# Patient Record
Sex: Male | Born: 1965 | Race: White | Hispanic: No | Marital: Married | Smoking: Former smoker
Health system: Southern US, Community
[De-identification: ages and names within clinical notes are randomized; demographics above are authoritative.]

## PROBLEM LIST (undated history)

## (undated) DIAGNOSIS — N200 Calculus of kidney: Secondary | ICD-10-CM

## (undated) DIAGNOSIS — I10 Essential (primary) hypertension: Secondary | ICD-10-CM

## (undated) DIAGNOSIS — T7840XA Allergy, unspecified, initial encounter: Secondary | ICD-10-CM

## (undated) HISTORY — DX: Essential (primary) hypertension: I10

## (undated) HISTORY — DX: Allergy, unspecified, initial encounter: T78.40XA

## (undated) HISTORY — DX: Calculus of kidney: N20.0

## (undated) HISTORY — PX: WISDOM TOOTH EXTRACTION: SHX21

## (undated) HISTORY — PX: OTHER SURGICAL HISTORY: SHX169

---

## 1999-11-18 ENCOUNTER — Emergency Department (HOSPITAL_COMMUNITY): Admission: EM | Admit: 1999-11-18 | Discharge: 1999-11-18 | Payer: Self-pay | Admitting: Emergency Medicine

## 2006-01-10 ENCOUNTER — Emergency Department (HOSPITAL_COMMUNITY): Admission: EM | Admit: 2006-01-10 | Discharge: 2006-01-10 | Payer: Self-pay | Admitting: Emergency Medicine

## 2006-01-15 ENCOUNTER — Ambulatory Visit: Payer: Self-pay | Admitting: Internal Medicine

## 2006-01-19 ENCOUNTER — Ambulatory Visit: Payer: Self-pay | Admitting: Internal Medicine

## 2012-11-02 ENCOUNTER — Ambulatory Visit (INDEPENDENT_AMBULATORY_CARE_PROVIDER_SITE_OTHER): Payer: 59 | Admitting: Physician Assistant

## 2012-11-02 VITALS — BP 130/80 | HR 83 | Temp 98.4°F | Resp 18 | Ht 71.0 in | Wt 214.0 lb

## 2012-11-02 DIAGNOSIS — J029 Acute pharyngitis, unspecified: Secondary | ICD-10-CM

## 2012-11-02 DIAGNOSIS — J309 Allergic rhinitis, unspecified: Secondary | ICD-10-CM

## 2012-11-02 DIAGNOSIS — R059 Cough, unspecified: Secondary | ICD-10-CM

## 2012-11-02 DIAGNOSIS — R509 Fever, unspecified: Secondary | ICD-10-CM

## 2012-11-02 DIAGNOSIS — R05 Cough: Secondary | ICD-10-CM

## 2012-11-02 DIAGNOSIS — M791 Myalgia, unspecified site: Secondary | ICD-10-CM

## 2012-11-02 DIAGNOSIS — IMO0001 Reserved for inherently not codable concepts without codable children: Secondary | ICD-10-CM

## 2012-11-02 LAB — POCT CBC
HCT, POC: 51.1 % (ref 43.5–53.7)
Lymph, poc: 1.3 (ref 0.6–3.4)
MCH, POC: 30.3 pg (ref 27–31.2)
MCV: 94.2 fL (ref 80–97)
MPV: 10.2 fL (ref 0–99.8)
POC Granulocyte: 2 (ref 2–6.9)
POC LYMPH PERCENT: 36.6 %L (ref 10–50)
POC MID %: 6.8 %M (ref 0–12)
RBC: 5.42 M/uL (ref 4.69–6.13)
WBC: 3.6 10*3/uL — AB (ref 4.6–10.2)

## 2012-11-02 MED ORDER — FLUTICASONE PROPIONATE 50 MCG/ACT NA SUSP: 2.0000 | Freq: Every day | NASAL | Status: DC

## 2012-11-02 MED ORDER — GUAIFENESIN ER 1200 MG PO TB12: 1.0000 | ORAL_TABLET | Freq: Two times a day (BID) | ORAL | Status: DC | PRN

## 2012-11-02 MED ORDER — BENZONATATE 100 MG PO CAPS: 100.0000 mg | ORAL_CAPSULE | Freq: Three times a day (TID) | ORAL | Status: DC | PRN

## 2012-11-02 MED ORDER — IPRATROPIUM BROMIDE 0.03 % NA SOLN: 2.0000 | Freq: Two times a day (BID) | NASAL | Status: DC

## 2012-11-02 NOTE — Progress Notes (Signed)
Subjective:    Patient ID: Brian Gay, male    DOB: 02/15/1966, 47 y.o.   MRN: [id number]  HPI  This 47 y.o. male presents for evaluation of several days of illness. Symptoms began 10/30/2012. "I feel like I've been beat with a baseball bat."  Tmax 102 yesterday.  Very congested.  Sore throat. Bad cough prevents sleeping. Little bit of diarrhea.  No N/V. "I have real bad allergy problems that usually starts about this time of year."  Has been feeling some ear symptoms, fullness and pressure, even before this illness. No flu vaccine this season.    History reviewed. No pertinent past medical history.  History reviewed. No pertinent past surgical history.  Prior to Admission medications   Medication Sig Start Date End Date Taking? Authorizing Provider  acetaminophen (TYLENOL) 500 MG tablet Take 500 mg by mouth every 6 (six) hours as needed for pain.   Yes Historical Provider, MD    Allergies  Allergen Reactions  . Codeine Swelling    History   Social History  . Marital Status: Married    Spouse Name: Tammy    Number of Children: 1  . Years of Education: 16   Occupational History  . Garage Supervisor [employer]   Social History Main Topics  . Smoking status: Never Smoker   . Alcohol Use: No  . Drug Use: No  . Sexually Active: Yes -- Male partner(s)    Social History Narrative   Married since 1984.    History reviewed. No pertinent family history.   Review of Systems As above.    Objective:   Physical Exam Blood pressure 130/80, pulse 83, temperature 98.4 F (36.9 C), temperature source Oral, resp. rate 18, height 5\' 11"  (1.803 m), weight 214 lb (97.07 kg), SpO2 98.00%. Body mass index is 29.86 kg/(m^2). Well-developed, well nourished WM who is awake, alert and oriented, in NAD. HEENT: [place]/AT, PERRL, EOMI.  Sclera and conjunctiva are clear.  EAC are patent, TMs are normal in appearance. Nasal mucosa is pink and moist, moderately congested. OP is clear.  No frontal or maxillary sinus tenderness. Neck: supple, no lymphadenopathy, but bilateral fullness and tenderness, no thyromegaly. Heart: RRR, no murmur Lungs: normal effort, CTA Extremities: no cyanosis, clubbing or edema. Skin: warm and dry without rash. Psychologic: good mood and appropriate affect, normal speech and behavior.  Results for orders placed in visit on 11/02/12  POCT INFLUENZA A/B      Result Value Range   Influenza A, POC Negative     Influenza B, POC Negative    POCT RAPID STREP A (OFFICE)      Result Value Range   Rapid Strep A Screen Negative  Negative  POCT CBC      Result Value Range   WBC 3.6 (*) 4.6 - 10.2 K/uL   Lymph, poc 1.3  0.6 - 3.4   POC LYMPH PERCENT 36.6  10 - 50 %L   MID (cbc) 0.2  0 - 0.9   POC MID % 6.8  0 - 12 %M   POC Granulocyte 2.0  2 - 6.9   Granulocyte percent 56.6  37 - 80 %G   RBC 5.42  4.69 - 6.13 M/uL   Hemoglobin 16.4  14.1 - 18.1 g/dL   HCT, POC 40.9  81.1 - 53.7 %   MCV 94.2  80 - 97 fL   MCH, POC 30.3  27 - 31.2 pg   MCHC 32.1  31.8 - 35.4 g/dL  RDW, POC 13.5     Platelet Count, POC 151  142 - 424 K/uL   MPV 10.2  0 - 99.8 fL      Assessment & Plan:  Fever - Plan: POCT Influenza A/B, POCT CBC, Culture, Group A Strep  Muscle pain - Plan: POCT Influenza A/B  Allergic rhinitis - Plan: fluticasone (FLONASE) 50 MCG/ACT nasal spray, ipratropium (ATROVENT) 0.03 % nasal spray  Acute pharyngitis - Plan: POCT rapid strep A, POCT CBC, Culture, Group A Strep  Cough - Plan: Guaifenesin (MUCINEX MAXIMUM STRENGTH) 1200 MG TB12, benzonatate (TESSALON) 100 MG capsule  Anticipatory guidance, supportive care, RTC if symptoms worsen/persist.

## 2012-11-02 NOTE — Patient Instructions (Signed)
It appears that you have a viral illness, possibly the flu (even though the flu test was negative). You need to rest, drink at least 64 ounces of water each day. Take ibuprofen &/or acetaminophen for aches and fever.

## 2012-11-05 LAB — CULTURE, GROUP A STREP

## 2012-11-10 ENCOUNTER — Telehealth: Payer: Self-pay

## 2012-11-10 NOTE — Telephone Encounter (Signed)
Pt called back about labs. Notified of normal. TC. Pt still feeling fatigued. Advised giving it 7-10 days and if he still felt bad, RTC

## 2013-03-08 ENCOUNTER — Ambulatory Visit (INDEPENDENT_AMBULATORY_CARE_PROVIDER_SITE_OTHER): Payer: 59 | Admitting: Emergency Medicine

## 2013-03-08 VITALS — BP 120/82 | HR 80 | Temp 98.0°F | Resp 18 | Ht 71.5 in | Wt 215.0 lb

## 2013-03-08 DIAGNOSIS — H6990 Unspecified Eustachian tube disorder, unspecified ear: Secondary | ICD-10-CM

## 2013-03-08 DIAGNOSIS — J018 Other acute sinusitis: Secondary | ICD-10-CM

## 2013-03-08 DIAGNOSIS — J309 Allergic rhinitis, unspecified: Secondary | ICD-10-CM

## 2013-03-08 DIAGNOSIS — H6992 Unspecified Eustachian tube disorder, left ear: Secondary | ICD-10-CM

## 2013-03-08 MED ORDER — AMOXICILLIN-POT CLAVULANATE 875-125 MG PO TABS: 1.0000 | ORAL_TABLET | Freq: Two times a day (BID) | ORAL | Status: DC

## 2013-03-08 MED ORDER — PSEUDOEPHEDRINE-GUAIFENESIN ER 60-600 MG PO TB12: 1.0000 | ORAL_TABLET | Freq: Two times a day (BID) | ORAL | Status: AC

## 2013-03-08 MED ORDER — FLUTICASONE PROPIONATE 50 MCG/ACT NA SUSP: 2.0000 | Freq: Every day | NASAL | Status: DC

## 2013-03-08 NOTE — Patient Instructions (Addendum)

## 2013-03-08 NOTE — Progress Notes (Signed)
[facility] [address] [place] [address] [phone]  Date:  03/08/2013   Name:  Brian Gay   DOB:  [birth year]   MRN:  [id number]  PCP:  No PCP Per Patient    Chief Complaint: sinus issues   History of Present Illness:  Brian Gay is a 47 y.o. very pleasant male patient who presents with the following:  Says that he has "sinus problems"  Has pain in left ear.  Has post nasal drip and no nasal drainage.  No fever or chills.  No nausea or vomiting.  Nonproductive cough.  Says fatigued and poor energy level.  No nausea or vomiting.  No improvement with claritin.  No improvement with over the counter medications or other home remedies. Denies other complaint or health concern today.   Patient Active Problem List   Diagnosis Date Noted  . Allergic rhinitis 11/02/2012    History reviewed. No pertinent past medical history.  History reviewed. No pertinent past surgical history.  History  Substance Use Topics  . Smoking status: Never Smoker   . Smokeless tobacco: Not on file  . Alcohol Use: No    History reviewed. No pertinent family history.  Allergies  Allergen Reactions  . Codeine Swelling    Medication list has been reviewed and updated.  Current Outpatient Prescriptions on File Prior to Visit  Medication Sig Dispense Refill  . acetaminophen (TYLENOL) 500 MG tablet Take 500 mg by mouth every 6 (six) hours as needed for pain.      . benzonatate (TESSALON) 100 MG capsule Take 1-2 capsules (100-200 mg total) by mouth 3 (three) times daily as needed for cough.  40 capsule  0  . fluticasone (FLONASE) 50 MCG/ACT nasal spray Place 2 sprays into the nose daily.  16 g  12  . Guaifenesin (MUCINEX MAXIMUM STRENGTH) 1200 MG TB12 Take 1 tablet (1,200 mg total) by mouth every 12 (twelve) hours as needed.  14 tablet  1  . ipratropium (ATROVENT) 0.03 % nasal spray Place 2 sprays into the nose 2 (two) times daily.  30 mL  0   No current  facility-administered medications on file prior to visit.    Review of Systems:  As per HPI, otherwise negative.    Physical Examination: Filed Vitals:   03/08/13 1644  BP: 120/82  Pulse: 80  Temp: 98 F (36.7 C)  Resp: 18   Filed Vitals:   03/08/13 1644  Height: 5' 11.5" (1.816 m)  Weight: 215 lb (97.523 kg)   Body mass index is 29.57 kg/(m^2). Ideal Body Weight: Weight in (lb) to have BMI = 25: 181.4  GEN: WDWN, NAD, Non-toxic, A & O x 3 HEENT: Atraumatic, Normocephalic. Neck supple. No masses, No LAD. Ears and Nose: No external deformity.  Purulent drainage and moderate congestion CV: RRR, No M/G/R. No JVD. No thrill. No extra heart sounds. PULM: CTA B, no wheezes, crackles, rhonchi. No retractions. No resp. distress. No accessory muscle use. ABD: S, NT, ND, +BS. No rebound. No HSM. EXTR: No c/c/e NEURO Normal gait.  PSYCH: Normally interactive. Conversant. Not depressed or anxious appearing.  Calm demeanor.    Assessment and Plan: Sinusitis Eustachian dysfunction augmentin mucinex d Explained proper use of flonase  Signed,  Phillips Odor, MD

## 2013-03-27 ENCOUNTER — Ambulatory Visit (INDEPENDENT_AMBULATORY_CARE_PROVIDER_SITE_OTHER): Payer: 59 | Admitting: Family Medicine

## 2013-03-27 ENCOUNTER — Telehealth: Payer: Self-pay

## 2013-03-27 VITALS — BP 128/92 | HR 81 | Temp 98.0°F | Resp 18 | Ht 72.0 in | Wt 220.0 lb

## 2013-03-27 DIAGNOSIS — J0100 Acute maxillary sinusitis, unspecified: Secondary | ICD-10-CM

## 2013-03-27 MED ORDER — PREDNISONE 20 MG PO TABS: ORAL_TABLET | ORAL | Status: DC

## 2013-03-27 MED ORDER — LEVOFLOXACIN 500 MG PO TABS: 500.0000 mg | ORAL_TABLET | Freq: Every day | ORAL | Status: DC

## 2013-03-27 NOTE — Telephone Encounter (Signed)
Patient calling because he says he feels worse and was hoping he could get either something called in  again or something new an Rx. Was seen 03/08/13 by Dr. Dareen Piano. Patient was hoping to avoid a $20 copay. Please advise.   Best number is: [phone]

## 2013-03-27 NOTE — Patient Instructions (Addendum)
Sinusitis Sinusitis is redness, soreness, and swelling (inflammation) of the paranasal sinuses. Paranasal sinuses are air pockets within the bones of your face (beneath the eyes, the middle of the forehead, or above the eyes). In healthy paranasal sinuses, mucus is able to drain out, and air is able to circulate through them by way of your nose. However, when your paranasal sinuses are inflamed, mucus and air can become trapped. This can allow bacteria and other germs to grow and cause infection. Sinusitis can develop quickly and last only a short time (acute) or continue over a long period (chronic). Sinusitis that lasts for more than 12 weeks is considered chronic.  CAUSES  Causes of sinusitis include:  Allergies.  Structural abnormalities, such as displacement of the cartilage that separates your nostrils (deviated septum), which can decrease the air flow through your nose and sinuses and affect sinus drainage.  Functional abnormalities, such as when the small hairs (cilia) that line your sinuses and help remove mucus do not work properly or are not present. SYMPTOMS  Symptoms of acute and chronic sinusitis are the same. The primary symptoms are pain and pressure around the affected sinuses. Other symptoms include:  Upper toothache.  Earache.  Headache.  Bad breath.  Decreased sense of smell and taste.  A cough, which worsens when you are lying flat.  Fatigue.  Fever.  Thick drainage from your nose, which often is green and may contain pus (purulent).  Swelling and warmth over the affected sinuses. DIAGNOSIS  Your caregiver will perform a physical exam. During the exam, your caregiver may:  Look in your nose for signs of abnormal growths in your nostrils (nasal polyps).  Tap over the affected sinus to check for signs of infection.  View the inside of your sinuses (endoscopy) with a special imaging device with a light attached (endoscope), which is inserted into your  sinuses. If your caregiver suspects that you have chronic sinusitis, one or more of the following tests may be recommended:  Allergy tests.  Nasal culture A sample of mucus is taken from your nose and sent to a lab and screened for bacteria.  Nasal cytology A sample of mucus is taken from your nose and examined by your caregiver to determine if your sinusitis is related to an allergy. TREATMENT  Most cases of acute sinusitis are related to a viral infection and will resolve on their own within 10 days. Sometimes medicines are prescribed to help relieve symptoms (pain medicine, decongestants, nasal steroid sprays, or saline sprays).  However, for sinusitis related to a bacterial infection, your caregiver will prescribe antibiotic medicines. These are medicines that will help kill the bacteria causing the infection.  Rarely, sinusitis is caused by a fungal infection. In theses cases, your caregiver will prescribe antifungal medicine. For some cases of chronic sinusitis, surgery is needed. Generally, these are cases in which sinusitis recurs more than 3 times per year, despite other treatments. HOME CARE INSTRUCTIONS   Drink plenty of water. Water helps thin the mucus so your sinuses can drain more easily.  Use a humidifier.  Inhale steam 3 to 4 times a day (for example, sit in the bathroom with the shower running).  Apply a warm, moist washcloth to your face 3 to 4 times a day, or as directed by your caregiver.  Use saline nasal sprays to help moisten and clean your sinuses.  Take over-the-counter or prescription medicines for pain, discomfort, or fever only as directed by your caregiver. SEEK IMMEDIATE MEDICAL   CARE IF:  You have increasing pain or severe headaches.  You have nausea, vomiting, or drowsiness.  You have swelling around your face.  You have vision problems.  You have a stiff neck.  You have difficulty breathing. MAKE SURE YOU:   Understand these  instructions.  Will watch your condition.  Will get help right away if you are not doing well or get worse. Document Released: 08/03/2005 Document Revised: 10/26/2011 Document Reviewed: 08/18/2011 [facility] Patient Information 2014 [place], [place]. Allergic Rhinitis Allergic rhinitis is when the mucous membranes in the nose respond to allergens. Allergens are particles in the air that cause your body to have an allergic reaction. This causes you to release allergic antibodies. Through a chain of events, these eventually cause you to release histamine into the blood stream (hence the use of antihistamines). Although meant to be protective to the body, it is this release that causes your discomfort, such as frequent sneezing, congestion and an itchy runny nose.  CAUSES  The pollen allergens may come from grasses, trees, and weeds. This is seasonal allergic rhinitis, or "hay fever." Other allergens cause year-round allergic rhinitis (perennial allergic rhinitis) such as house dust mite allergen, pet dander and mold spores.  SYMPTOMS   Nasal stuffiness (congestion).  Runny, itchy nose with sneezing and tearing of the eyes.  There is often an itching of the mouth, eyes and ears. It cannot be cured, but it can be controlled with medications. DIAGNOSIS  If you are unable to determine the offending allergen, skin or blood testing may find it. TREATMENT   Avoid the allergen.  Medications and allergy shots (immunotherapy) can help.  Hay fever may often be treated with antihistamines in pill or nasal spray forms. Antihistamines block the effects of histamine. There are over-the-counter medicines that may help with nasal congestion and swelling around the eyes. Check with your caregiver before taking or giving this medicine. If the treatment above does not work, there are many new medications your caregiver can prescribe. Stronger medications may be used if initial measures are ineffective.  Desensitizing injections can be used if medications and avoidance fails. Desensitization is when a patient is given ongoing shots until the body becomes less sensitive to the allergen. Make sure you follow up with your caregiver if problems continue. SEEK MEDICAL CARE IF:   You develop fever (more than 100.5 F (38.1 C).  You develop a cough that does not stop easily (persistent).  You have shortness of breath.  You start wheezing.  Symptoms interfere with normal daily activities. Document Released: 04/28/2001 Document Revised: 10/26/2011 Document Reviewed: 11/07/2008 [facility] Patient Information 2014 [place], [place].

## 2013-03-27 NOTE — Progress Notes (Signed)
Subjective:    Patient ID: Brian Gay, male    DOB: [birth year], 47 y.o.   MRN: [id number]   Chief Complaint  Patient presents with  . Follow-up    right ear pain, still not feeling well   HPI  Initially after he was seen 3 wks ago did get a little better when on Augmentin but as soon as he completed the course it got much worse again immediately.  Severe right ear pain - lots of pressure and throbbing.  For the last 4-5 years he has had allergy problems and often requires multiple courses of antibiotics until he is given the "strongest stuff" to knock it out - but doesn't remember what it was.  Still been using the flonase - causing nose to be raw.  Also using saline nasal spray and mucinex but not getting any relief.  Feels like maxillary sinuses are swollen and pressure with HA and left cervical lymph nodes swollen with a little sore throat. No f/c. No nosebleeds, no dental or gum pain.  History reviewed. No pertinent past medical history. Current Outpatient Prescriptions on File Prior to Visit  Medication Sig Dispense Refill  . fluticasone (FLONASE) 50 MCG/ACT nasal spray Place 2 sprays into the nose daily.  16 g  12  . ipratropium (ATROVENT) 0.03 % nasal spray Place 2 sprays into the nose 2 (two) times daily.  30 mL  0  . pseudoephedrine-guaifenesin (MUCINEX D) 60-600 MG per tablet Take 1 tablet by mouth every 12 (twelve) hours.  18 tablet  0  . acetaminophen (TYLENOL) 500 MG tablet Take 500 mg by mouth every 6 (six) hours as needed for pain.      . Loratadine (CLARITIN PO) Take by mouth.       No current facility-administered medications on file prior to visit.   Allergies  Allergen Reactions  . Codeine Swelling     Review of Systems  Constitutional: Positive for fatigue. Negative for fever, chills, diaphoresis, activity change, appetite change and unexpected weight change.  HENT: Positive for congestion, ear pain, facial swelling, postnasal drip, rhinorrhea, sinus pressure,  sneezing and sore throat. Negative for dental problem, ear discharge, mouth sores and nosebleeds.   Respiratory: Positive for cough. Negative for shortness of breath.   Cardiovascular: Negative for chest pain.  Gastrointestinal: Negative for nausea, vomiting, abdominal pain, diarrhea and constipation.  Genitourinary: Negative for dysuria.  Musculoskeletal: Negative for arthralgias, myalgias, neck pain and neck stiffness.  Skin: Negative for rash.  Neurological: Positive for headaches. Negative for syncope.  Hematological: Positive for adenopathy.  Psychiatric/Behavioral: Positive for sleep disturbance.      BP 128/92  Pulse 81  Temp(Src) 98 F (36.7 C) (Oral)  Resp 18  Ht 6' (1.829 m)  Wt 220 lb (99.791 kg)  BMI 29.83 kg/m2  SpO2 99% Objective:   Physical Exam  Constitutional: He is oriented to person, place, and time. He appears well-developed and well-nourished. No distress.  HENT:  Head: Normocephalic and atraumatic.  Right Ear: External ear and ear canal normal. Tympanic membrane is retracted. A middle ear effusion is present.  Left Ear: External ear and ear canal normal. Tympanic membrane is retracted. A middle ear effusion is present.  Nose: Mucosal edema and rhinorrhea present. Right sinus exhibits maxillary sinus tenderness. Left sinus exhibits maxillary sinus tenderness.  Mouth/Throat: Uvula is midline and mucous membranes are normal. Posterior oropharyngeal erythema present. No oropharyngeal exudate or posterior oropharyngeal edema.  Eyes: Conjunctivae are normal. Right eye exhibits no discharge.  Left eye exhibits no discharge. No scleral icterus.  Neck: Normal range of motion. Neck supple. No thyromegaly present.  Cardiovascular: Normal rate, regular rhythm, normal heart sounds and intact distal pulses.   Pulmonary/Chest: Effort normal and breath sounds normal. No respiratory distress.  Lymphadenopathy:       Head (right side): Submandibular adenopathy present.        Head (left side): Submandibular adenopathy present.    He has no cervical adenopathy.       Right: No supraclavicular adenopathy present.       Left: No supraclavicular adenopathy present.  Neurological: He is alert and oriented to person, place, and time.  Skin: Skin is warm and dry. He is not diaphoretic. No erythema.  Psychiatric: He has a normal mood and affect. His behavior is normal.      Assessment & Plan:   Sinusitis, acute maxillary  Meds ordered this encounter  Medications  . predniSONE (DELTASONE) 20 MG tablet    Sig: Take 3 tabs po qd x 3d, then 2 tabs po qd x 3d, then 1 tab po qd x 3d    Dispense:  18 tablet    Refill:  0  . levofloxacin (LEVAQUIN) 500 MG tablet    Sig: Take 1 tablet (500 mg total) by mouth daily.    Dispense:  10 tablet    Refill:  0    Norberto Sorenson, MD MPH

## 2013-03-28 NOTE — Telephone Encounter (Signed)
He did come in yesterday.

## 2015-10-10 ENCOUNTER — Encounter: Payer: Self-pay | Admitting: Medical

## 2015-10-10 ENCOUNTER — Ambulatory Visit (INDEPENDENT_AMBULATORY_CARE_PROVIDER_SITE_OTHER): Payer: Commercial Managed Care - HMO | Admitting: Medical

## 2015-10-10 VITALS — BP 132/86 | HR 77 | Temp 98.1°F | Ht 71.5 in | Wt 215.0 lb

## 2015-10-10 DIAGNOSIS — H6691 Otitis media, unspecified, right ear: Secondary | ICD-10-CM | POA: Diagnosis not present

## 2015-10-10 DIAGNOSIS — F17200 Nicotine dependence, unspecified, uncomplicated: Secondary | ICD-10-CM

## 2015-10-10 DIAGNOSIS — Z566 Other physical and mental strain related to work: Secondary | ICD-10-CM

## 2015-10-10 DIAGNOSIS — R0981 Nasal congestion: Secondary | ICD-10-CM | POA: Diagnosis not present

## 2015-10-10 DIAGNOSIS — I10 Essential (primary) hypertension: Secondary | ICD-10-CM | POA: Diagnosis not present

## 2015-10-10 DIAGNOSIS — Z720 Tobacco use: Secondary | ICD-10-CM | POA: Diagnosis not present

## 2015-10-10 LAB — COMPREHENSIVE METABOLIC PANEL
ALBUMIN: 4.1 g/dL (ref 3.5–5.2)
ALT: 21 U/L (ref 0–53)
AST: 16 U/L (ref 0–37)
Alkaline Phosphatase: 85 U/L (ref 39–117)
BUN: 12 mg/dL (ref 6–23)
CHLORIDE: 106 meq/L (ref 96–112)
CO2: 30 meq/L (ref 19–32)
CREATININE: 1.04 mg/dL (ref 0.40–1.50)
Calcium: 9.3 mg/dL (ref 8.4–10.5)
GFR: 80.42 mL/min (ref >60.00)
GLUCOSE: 85 mg/dL (ref 70–99)
Potassium: 3.7 mEq/L (ref 3.5–5.1)
SODIUM: 140 meq/L (ref 135–145)
Total Bilirubin: 0.5 mg/dL (ref 0.2–1.2)
Total Protein: 7.1 g/dL (ref 6.0–8.3)

## 2015-10-10 MED ORDER — AZITHROMYCIN 250 MG PO TABS
ORAL_TABLET | ORAL | Status: DC
Start: 2015-10-10 — End: 2015-10-24

## 2015-10-10 MED ORDER — FLUTICASONE PROPIONATE 50 MCG/ACT NA SUSP
2.0000 | Freq: Every day | NASAL | Status: DC
Start: 2015-10-10 — End: 2015-12-12

## 2015-10-10 MED ORDER — LOSARTAN POTASSIUM 50 MG PO TABS
50.0000 mg | ORAL_TABLET | Freq: Every day | ORAL | Status: DC
Start: 2015-10-10 — End: 2015-10-24

## 2015-10-10 NOTE — Progress Notes (Signed)
Subjective:    Patient ID: Brian Gay, male    DOB: 08/15/1966, 50 y.o.   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  Pt in states his blood pressure has been elevated. Has been checking for his bp over past 1-2 months. Yesterday his bp was 160/113. Other readings he gets at clinic at work has been borderline but above [id number] systolic. Diastolic have been over 90. 2 times he remember 96 and 97.  Yesterday he vomited twice after he got his bp checked. But today he does not feel nausea, no vomiting and no gross motor/sensory function deficits. Pt yesterday at work clinic given zofran and meclizine. He has not taken neither of the 2.   Pt states has been nasal congested and sick recently for about one month. No sneezing or itching eyes. He states cough, runny nose and some cough. Pt went to work clinic and was given amoxicillin. While on he felt better. When he got off med felt sick again.  Pt is a smoker.  Pt states job is very stressful. 3 years with this job. 1.5 years more stressful.    Review of Systems  Constitutional: Negative for fever, chills, diaphoresis and fatigue.  HENT: Positive for ear pain and sinus pressure. Negative for congestion, drooling, mouth sores, nosebleeds and postnasal drip.        Rt ear pressure  Respiratory: Positive for cough. Negative for choking, shortness of breath and wheezing.   Cardiovascular: Positive for chest pain. Negative for palpitations.  Gastrointestinal: Negative for nausea, abdominal pain, diarrhea, constipation, blood in stool and abdominal distention.       No vomiting since 1 pm yesterday.  Musculoskeletal: Negative for myalgias and back pain.  Skin: Negative for rash.  Neurological: Negative for dizziness, syncope, speech difficulty, weakness and headaches.       Maybe faint dizzy only if he turns his head  fast.  Hematological: Negative for adenopathy. Does not bruise/bleed easily.  Psychiatric/Behavioral: Negative for behavioral problems and confusion.    Past Medical History  Diagnosis Date  . Hypertension   . Kidney stones     Social History   Social History  . Marital Status: Married    Spouse Name: Tammy  . Number of Children: 1  . Years of Education: 16   Occupational History  . 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 Smoking status: Current Every Day Smoker -- 0.25 packs/day for 20 years  . Smokeless tobacco: Current User    Types: Chew  . Alcohol Use: No  . Drug Use: No  . Sexual Activity:    Partners: Female   Other Topics Concern  . Not on file   Social History Narrative   Married since 1984.    History reviewed. No pertinent past surgical history.  Family History  Problem Relation Age of Onset  . Heart disease Mother   . Hypertension Mother   . Heart disease Father   . Cancer Father     prostate  . Heart disease Maternal Grandmother   . Heart disease Maternal Grandfather   . Heart disease Paternal Grandmother   . Heart disease Paternal Grandfather     Allergies  Allergen Reactions  . Codeine Swelling    Current Outpatient Prescriptions on File Prior to Visit  Medication Sig Dispense Refill  .  fluticasone (FLONASE) 50 MCG/ACT nasal spray Place 2 sprays into the nose daily. 16 g 12   No current facility-administered medications on file prior to visit.    BP 132/86 mmHg  Pulse 77  Temp(Src) 98.1 F (36.7 C) (Oral)  Ht 5' 11.5" (1.816 m)  Wt 215 lb (97.523 kg)  BMI 29.57 kg/m2  SpO2 98%       Objective:   Physical Exam  General Mental Status- Alert. General Appearance- Not in acute distress.    HEENT Head- Normal. Ear Auditory Canal - Left- Normal. Right - tm dull.Tympanic Membrane- Left- Normal. Right- Normal. Eye Sclera/Conjunctiva- Left- Normal. Right- Normal. Nose & Sinuses Nasal  Mucosa- Left-  Boggy and Congested. Right-  Boggy and  Congested.Bilateral  No maxillary and  No frontal sinus pressure. Mouth & Throat Lips: Upper Lip- Normal: no dryness, cracking, pallor, cyanosis, or vesicular eruption. Lower Lip-Normal: no dryness, cracking, pallor, cyanosis or vesicular eruption. Buccal Mucosa- Bilateral- No Aphthous ulcers. No ulcers in his mouth(where he puts chewing tobacco0 Oropharynx- No Discharge or Erythema. Tonsils: Characteristics- Bilateral- mild  Erythema and  Congestion. Size/Enlargement- Bilateral- No enlargement. Discharge- bilateral-None.   Skin General: Color- Normal Color. Moisture- Normal Moisture.  Neck Carotid Arteries- Normal color. Moisture- Normal Moisture. No carotid bruits. No JVD.  Chest and Lung Exam Auscultation: Breath Sounds:-Normal. CTA.  Cardiovascular Auscultation:Rythm- Regular. Murmurs & Other Heart Sounds:Auscultation of the heart reveals- No Murmurs.  Abdomen Inspection:-Inspeection Normal. Palpation/Percussion:Note:No mass. Palpation and Percussion of the abdomen reveal- Non Tender, Non Distended + BS, no rebound or guarding.   Neurologic Cranial Nerve exam:- CN III-XII intact(No nystagmus), symmetric smile. Strength:- 5/5 equal and symmetric strength both upper and lower extremities.      Assessment & Plan:  For you blood pressure. Will rx losartan. Can check bp at work 2-3 times at week. May take some time for med to take full effect.  For nasal congestion(possible allergic rhinitis). Rx flonase.  For rt ear infection. Azithromycin.  You will try to stop smoking. Offer med in future to help if you like.  If stress is worsening or anxious can rx med such as sertraline.  Follow up in 2 wks or as needed.(on follow up come in fasting and will check your lipid panel)  Advised if recurrent dizziness then start meclizine. But if dizziness with other neurologic type symptoms then ED.

## 2015-10-10 NOTE — Patient Instructions (Signed)
For you blood pressure. Will rx losartan. Can check bp at work 2-3 times at week. May take some time for med to take full effect.  For nasal congestion(possible allergic rhinitis). Rx flonase.  For rt ear infection. Azithromycin.  You will try to stop smoking. Offer med in future to help if you like.  If stress is worsening or anxious can rx med such as sertraline.  Follow up in 2 wks or as needed.(on follow up come in fasting and will check your lipid panel)

## 2015-10-10 NOTE — Progress Notes (Signed)
Pre visit review using our clinic review tool, if applicable. No additional management support is needed unless otherwise documented below in the visit note. 

## 2015-10-24 ENCOUNTER — Ambulatory Visit (INDEPENDENT_AMBULATORY_CARE_PROVIDER_SITE_OTHER): Payer: Commercial Managed Care - HMO | Admitting: Medical

## 2015-10-24 ENCOUNTER — Encounter: Payer: Self-pay | Admitting: Medical

## 2015-10-24 VITALS — BP 130/80 | HR 77 | Temp 97.8°F | Ht 72.0 in | Wt 218.0 lb

## 2015-10-24 DIAGNOSIS — H6981 Other specified disorders of Eustachian tube, right ear: Secondary | ICD-10-CM

## 2015-10-24 DIAGNOSIS — R0981 Nasal congestion: Secondary | ICD-10-CM

## 2015-10-24 DIAGNOSIS — I10 Essential (primary) hypertension: Secondary | ICD-10-CM

## 2015-10-24 MED ORDER — AZELASTINE HCL 0.1 % NA SOLN: 2.0000 | Freq: Two times a day (BID) | NASAL | Status: DC

## 2015-10-24 MED ORDER — LOSARTAN POTASSIUM 50 MG PO TABS
50.0000 mg | ORAL_TABLET | Freq: Every day | ORAL | Status: DC
Start: 2015-10-24 — End: 2016-07-01

## 2015-10-24 NOTE — Progress Notes (Signed)
Pre visit review using our clinic review tool, if applicable. No additional management support is needed unless otherwise documented below in the visit note. 

## 2015-10-24 NOTE — Patient Instructions (Addendum)
htn- improved and feeling better on med. Will rx refills of losartan.  Nasal congestion improved. I think allergy related. Use flonase daily and if congestion persists then add on astelin.  Your faint ear pressure at this point appears eustachian tube dysfunction. Flonase daily may help. Does not appear ear infection at this point. If pressure increases or pain let us recheck your ear.  Continue to cut back/taper off smoking as you are doing.  Follow up in about 4-6 weeks. Please schedule CPE(come in fasting)

## 2015-10-24 NOTE — Progress Notes (Signed)
Subjective:    Patient ID: Brian Gay, male    DOB: [birth year], 50 y.o.   MRN: [id number]  HPI  Pt in for follow up. His blood pressure has been good. Pt has has been checking his bp and he has been getting in 130/80 range. MA checked bp today and is better.   Pt had no side effects with his medication.   Pt has been eating better and has lost 6 lbs.  Pt still has some nasal congestion. Partially improved with flonase.(he admits to not using it daily). He estimates using 2/3 days.  Pt by exam had red rt tm.  Faint pressure sensation.  Pt only smoking 1-2 cigaretes a day.   Review of Systems  Constitutional: Negative for fever, chills, diaphoresis, activity change and fatigue.       Overall feels a lot better. More energy.  HENT: Positive for congestion and ear pain. Negative for hearing loss, nosebleeds, postnasal drip, rhinorrhea, sinus pressure, sneezing and sore throat.        Ear pressure  Respiratory: Negative for cough, chest tightness and shortness of breath.   Cardiovascular: Negative for chest pain, palpitations and leg swelling.  Gastrointestinal: Negative for nausea, vomiting and abdominal pain.  Musculoskeletal: Negative for neck pain and neck stiffness.  Neurological: Negative for dizziness, syncope, facial asymmetry, weakness, numbness and headaches.  Psychiatric/Behavioral: Negative for behavioral problems, confusion and agitation. The patient is not nervous/anxious.      Past Medical History  Diagnosis Date  . Hypertension   . Kidney stones     Social History   Social History  . Marital Status: Married    Spouse Name: Tammy  . Number of Children: 1  . Years of Education: 16   Occupational History  . 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 Smoking status: Current Every Day Smoker -- 0.25 packs/day for 20 years  . Smokeless tobacco: Current User    Types: Chew  . Alcohol Use: No  . Drug Use: No  . Sexual  Activity:    Partners: Female   Other Topics Concern  . Not on file   Social History Narrative   Married since 1984.    No past surgical history on file.  Family History  Problem Relation Age of Onset  . Heart disease Mother   . Hypertension Mother   . Heart disease Father   . Cancer Father     prostate  . Heart disease Maternal Grandmother   . Heart disease Maternal Grandfather   . Heart disease Paternal Grandmother   . Heart disease Paternal Grandfather     Allergies  Allergen Reactions  . Codeine Swelling    Current Outpatient Prescriptions on File Prior to Visit  Medication Sig Dispense Refill  . fluticasone (FLONASE) 50 MCG/ACT nasal spray Place 2 sprays into both nostrils daily. 16 g 1  . losartan (COZAAR) 50 MG tablet Take 1 tablet (50 mg total) by mouth daily. 30 tablet 0  . meclizine (ANTIVERT) 25 MG tablet Take 25 mg by mouth 3 (three) times daily as needed for dizziness.    . ondansetron (ZOFRAN) 8 MG tablet      No current facility-administered medications on file prior to visit.    BP 130/80 mmHg  Pulse 77  Temp(Src) 97.8 F (36.6 C) (Oral)  Ht 6' (1.829 m)  Wt 218 lb (98.884 kg)  BMI 29.56 kg/m2  SpO2 98%  Objective:   Physical Exam    General  Mental Status - Alert. General Appearance - Well groomed. Not in acute distress.  Skin Rashes- No Rashes.  HEENT Head- Normal. Ear Auditory Canal - Left- Normal. Right - Normal.Tympanic Membrane- Left- Normal. Right- faint dull no bright redness. Eye Sclera/Conjunctiva- Left- Normal. Right- Normal. Nose & Sinuses Nasal Mucosa- Left-  Boggy and Congested. Right-  Boggy and  Congested.Bilateral no  maxillary and  No frontal sinus pressure. Mouth & Throat Lips: Upper Lip- Normal: no dryness, cracking, pallor, cyanosis, or vesicular eruption. Lower Lip-Normal: no dryness, cracking, pallor, cyanosis or vesicular eruption. Buccal Mucosa- Bilateral- No Aphthous ulcers. Oropharynx- No  Discharge or Erythema. Tonsils: Characteristics- Bilateral- No Erythema or Congestion. Size/Enlargement- Bilateral- No enlargement. Discharge- bilateral-None.  Neck Neck- Supple. No Masses.   Chest and Lung Exam Auscultation: Breath Sounds:-Clear even and unlabored.  Cardiovascular Auscultation:Rythm- Regular, rate and rhythm. Murmurs & Other Heart Sounds:Ausculatation of the heart reveal- No Murmurs.  Lymphatic Head & Neck General Head & Neck Lymphatics: Bilateral: Description- No Localized lymphadenopathy.  Neurologic Cranial Nerve exam:- CN III-XII intact(No nystagmus), symmetric smile. Strength:- 5/5 equal and symmetric strength both upper and lower extremities.       Assessment & Plan:  htn- improved and feeling better on med. Will rx refills of losartan.  Nasal congestion improved. I think allergy related. Use flonase daily and if congestion persists then add on astelin.  Your faint ear pressure at this point appears eustachian tube dysfunction. Flonase daily may help. Does not appear ear infection at this point. If pressure increases or pain let us recheck your ear.  Continue to cut back/taper off smoking as you are doing.  Follow up in about 4-6 weeks. Please schedule CPE(come in fasting)

## 2015-12-11 ENCOUNTER — Encounter: Payer: Self-pay | Admitting: Behavioral Health

## 2015-12-11 ENCOUNTER — Telehealth: Payer: Self-pay | Admitting: Behavioral Health

## 2015-12-11 NOTE — Telephone Encounter (Signed)
Pre-Visit Call completed with patient and chart updated.   Pre-Visit Info documented in Specialty Comments under [employer].    

## 2015-12-11 NOTE — Addendum Note (Signed)
Addended by: Eduard Roux E on: 12/11/2015 04:04 PM   Modules accepted: Orders, Medications

## 2015-12-11 NOTE — Telephone Encounter (Signed)
Pt is returning your call

## 2015-12-11 NOTE — Telephone Encounter (Signed)
Unable to reach patient at time of Pre-Visit Call.  Left message for patient to return call when available.    

## 2015-12-12 ENCOUNTER — Encounter: Payer: Self-pay | Admitting: Gastroenterology

## 2015-12-12 ENCOUNTER — Ambulatory Visit (INDEPENDENT_AMBULATORY_CARE_PROVIDER_SITE_OTHER): Payer: Commercial Managed Care - HMO | Admitting: Medical

## 2015-12-12 ENCOUNTER — Encounter: Payer: Self-pay | Admitting: Medical

## 2015-12-12 VITALS — BP 128/88 | HR 78 | Temp 97.9°F | Ht 72.0 in | Wt 218.6 lb

## 2015-12-12 DIAGNOSIS — Z23 Encounter for immunization: Secondary | ICD-10-CM | POA: Diagnosis not present

## 2015-12-12 DIAGNOSIS — Z0189 Encounter for other specified special examinations: Secondary | ICD-10-CM

## 2015-12-12 DIAGNOSIS — Z125 Encounter for screening for malignant neoplasm of prostate: Secondary | ICD-10-CM

## 2015-12-12 DIAGNOSIS — T148 Other injury of unspecified body region: Secondary | ICD-10-CM | POA: Diagnosis not present

## 2015-12-12 DIAGNOSIS — W57XXXA Bitten or stung by nonvenomous insect and other nonvenomous arthropods, initial encounter: Secondary | ICD-10-CM | POA: Diagnosis not present

## 2015-12-12 DIAGNOSIS — Z1211 Encounter for screening for malignant neoplasm of colon: Secondary | ICD-10-CM

## 2015-12-12 DIAGNOSIS — Z0000 Encounter for general adult medical examination without abnormal findings: Secondary | ICD-10-CM

## 2015-12-12 DIAGNOSIS — Z113 Encounter for screening for infections with a predominantly sexual mode of transmission: Secondary | ICD-10-CM | POA: Diagnosis not present

## 2015-12-12 LAB — LIPID PANEL
CHOL/HDL RATIO: 5
Cholesterol: 202 mg/dL — ABNORMAL HIGH (ref 0–200)
HDL: 38.7 mg/dL — AB (ref >39.00)
LDL CALC: 150 mg/dL — AB (ref 0–99)
NonHDL: 163.56
TRIGLYCERIDES: 70 mg/dL (ref 0.0–149.0)
VLDL: 14 mg/dL (ref 0.0–40.0)

## 2015-12-12 LAB — COMPREHENSIVE METABOLIC PANEL
ALK PHOS: 76 U/L (ref 39–117)
ALT: 24 U/L (ref 0–53)
AST: 18 U/L (ref 0–37)
Albumin: 4.1 g/dL (ref 3.5–5.2)
BUN: 9 mg/dL (ref 6–23)
CO2: 29 meq/L (ref 19–32)
Calcium: 9.2 mg/dL (ref 8.4–10.5)
Chloride: 105 mEq/L (ref 96–112)
Creatinine, Ser: 1.01 mg/dL (ref 0.40–1.50)
GFR: 83.12 mL/min (ref >60.00)
GLUCOSE: 91 mg/dL (ref 70–99)
POTASSIUM: 4.5 meq/L (ref 3.5–5.1)
SODIUM: 140 meq/L (ref 135–145)
TOTAL PROTEIN: 6.9 g/dL (ref 6.0–8.3)
Total Bilirubin: 0.6 mg/dL (ref 0.2–1.2)

## 2015-12-12 LAB — CBC WITH DIFFERENTIAL/PLATELET
BASOS ABS: 0 10*3/uL (ref 0.0–0.1)
Basophils Relative: 0.3 % (ref 0.0–3.0)
EOS PCT: 2.3 % (ref 0.0–5.0)
Eosinophils Absolute: 0.1 10*3/uL (ref 0.0–0.7)
HCT: 47.7 % (ref 39.0–52.0)
Hemoglobin: 16.1 g/dL (ref 13.0–17.0)
LYMPHS ABS: 1.6 10*3/uL (ref 0.7–4.0)
Lymphocytes Relative: 30.7 % (ref 12.0–46.0)
MCHC: 33.8 g/dL (ref 30.0–36.0)
MCV: 90.3 fl (ref 78.0–100.0)
MONO ABS: 0.4 10*3/uL (ref 0.1–1.0)
Monocytes Relative: 8 % (ref 3.0–12.0)
NEUTROS PCT: 58.7 % (ref 43.0–77.0)
Neutro Abs: 3.1 10*3/uL (ref 1.4–7.7)
Platelets: 172 10*3/uL (ref 150.0–400.0)
RBC: 5.28 Mil/uL (ref 4.22–5.81)
RDW: 13.5 % (ref 11.5–15.5)
WBC: 5.4 10*3/uL (ref 4.0–10.5)

## 2015-12-12 LAB — POC URINALSYSI DIPSTICK (AUTOMATED)
BILIRUBIN UA: NEGATIVE
Blood, UA: NEGATIVE
Glucose, UA: NEGATIVE
KETONES UA: NEGATIVE
LEUKOCYTES UA: NEGATIVE
NITRITE UA: NEGATIVE
PH UA: 5.5
Protein, UA: NEGATIVE
UROBILINOGEN UA: 0.2

## 2015-12-12 LAB — TSH: TSH: 1.21 u[IU]/mL (ref 0.35–4.50)

## 2015-12-12 LAB — HIV ANTIBODY (ROUTINE TESTING W REFLEX): HIV 1&2 Ab, 4th Generation: NONREACTIVE

## 2015-12-12 MED ORDER — DOXYCYCLINE HYCLATE 100 MG PO TABS: 100.0000 mg | ORAL_TABLET | Freq: Two times a day (BID) | ORAL | Status: DC

## 2015-12-12 NOTE — Progress Notes (Signed)
Pre visit review using our clinic review tool, if applicable. No additional management support is needed unless otherwise documented below in the visit note. 

## 2015-12-12 NOTE — Patient Instructions (Addendum)
Wellness examination For your cpe will get hiv, cbc, cmp, tsh, lipid and will put in future psa(psa to be done when 50 yo.  Referral for colonosocpy placed. UA done today as well.     For tick bite will get tick bite labs and start doxycycline antibiotic.   Follow up date to be determined after lab review  .Preventive Care for Adults, Male A healthy lifestyle and preventive care can promote health and wellness. Preventive health guidelines for men include the following key practices:  A routine yearly physical is a good way to check with your health care provider about your health and preventative screening. It is a chance to share any concerns and updates on your health and to receive a thorough exam.  Visit your dentist for a routine exam and preventative care every 6 months. Brush your teeth twice a day and floss once a day. Good oral hygiene prevents tooth decay and gum disease.  The frequency of eye exams is based on your age, health, family medical history, use of contact lenses, and other factors. Follow your health care provider's recommendations for frequency of eye exams.  Eat a healthy diet. Foods such as vegetables, fruits, whole grains, low-fat dairy products, and lean protein foods contain the nutrients you need without too many calories. Decrease your intake of foods high in solid fats, added sugars, and salt. Eat the right amount of calories for you.Get information about a proper diet from your health care provider, if necessary.  Regular physical exercise is one of the most important things you can do for your health. Most adults should get at least 150 minutes of moderate-intensity exercise (any activity that increases your heart rate and causes you to sweat) each week. In addition, most adults need muscle-strengthening exercises on 2 or more days a week.  Maintain a healthy weight. The body mass index (BMI) is a screening tool to identify possible weight problems. It  provides an estimate of body fat based on height and weight. Your health care provider can find your BMI and can help you achieve or maintain a healthy weight.For adults 20 years and older:  A BMI below 18.5 is considered underweight.  A BMI of 18.5 to 24.9 is normal.  A BMI of 25 to 29.9 is considered overweight.  A BMI of 30 and above is considered obese.  Maintain normal blood lipids and cholesterol levels by exercising and minimizing your intake of saturated fat. Eat a balanced diet with plenty of fruit and vegetables. Blood tests for lipids and cholesterol should begin at age 101 and be repeated every 5 years. If your lipid or cholesterol levels are high, you are over 50, or you are at high risk for heart disease, you may need your cholesterol levels checked more frequently.Ongoing high lipid and cholesterol levels should be treated with medicines if diet and exercise are not working.  If you smoke, find out from your health care provider how to quit. If you do not use tobacco, do not start.  Lung cancer screening is recommended for adults aged 52-80 years who are at high risk for developing lung cancer because of a history of smoking. A yearly low-dose CT scan of the lungs is recommended for people who have at least a 30-pack-year history of smoking and are a current smoker or have quit within the past 15 years. A pack year of smoking is smoking an average of 1 pack of cigarettes a day for 1 year (for example:  1 pack a day for 30 years or 2 packs a day for 15 years). Yearly screening should continue until the smoker has stopped smoking for at least 15 years. Yearly screening should be stopped for people who develop a health problem that would prevent them from having lung cancer treatment.  If you choose to drink alcohol, do not have more than 2 drinks per day. One drink is considered to be 12 ounces (355 mL) of beer, 5 ounces (148 mL) of wine, or 1.5 ounces (44 mL) of liquor.  Avoid use of  street drugs. Do not share needles with anyone. Ask for help if you need support or instructions about stopping the use of drugs.  High blood pressure causes heart disease and increases the risk of stroke. Your blood pressure should be checked at least every 1-2 years. Ongoing high blood pressure should be treated with medicines, if weight loss and exercise are not effective.  If you are 82-59 years old, ask your health care provider if you should take aspirin to prevent heart disease.  Diabetes screening is done by taking a blood sample to check your blood glucose level after you have not eaten for a certain period of time (fasting). If you are not overweight and you do not have risk factors for diabetes, you should be screened once every 3 years starting at age 38. If you are overweight or obese and you are 11-60 years of age, you should be screened for diabetes every year as part of your cardiovascular risk assessment.  Colorectal cancer can be detected and often prevented. Most routine colorectal cancer screening begins at the age of 47 and continues through age 79. However, your health care provider may recommend screening at an earlier age if you have risk factors for colon cancer. On a yearly basis, your health care provider may provide home test kits to check for hidden blood in the stool. Use of a small camera at the end of a tube to directly examine the colon (sigmoidoscopy or colonoscopy) can detect the earliest forms of colorectal cancer. Talk to your health care provider about this at age 25, when routine screening begins. Direct exam of the colon should be repeated every 5-10 years through age 91, unless early forms of precancerous polyps or small growths are found.  People who are at an increased risk for hepatitis B should be screened for this virus. You are considered at high risk for hepatitis B if:  You were born in a country where hepatitis B occurs often. Talk with your health care  provider about which countries are considered high risk.  Your parents were born in a high-risk country and you have not received a shot to protect against hepatitis B (hepatitis B vaccine).  You have HIV or AIDS.  You use needles to inject street drugs.  You live with, or have sex with, someone who has hepatitis B.  You are a man who has sex with other men (MSM).  You get hemodialysis treatment.  You take certain medicines for conditions such as cancer, organ transplantation, and autoimmune conditions.  Hepatitis C blood testing is recommended for all people born from [birth year] through 1965 and any individual with known risks for hepatitis C.  Practice safe sex. Use condoms and avoid high-risk sexual practices to reduce the spread of sexually transmitted infections (STIs). STIs include gonorrhea, chlamydia, syphilis, trichomonas, herpes, HPV, and human immunodeficiency virus (HIV). Herpes, HIV, and HPV are viral illnesses that have no  cure. They can result in disability, cancer, and death.  If you are a man who has sex with other men, you should be screened at least once per year for:  HIV.  Urethral, rectal, and pharyngeal infection of gonorrhea, chlamydia, or both.  If you are at risk of being infected with HIV, it is recommended that you take a prescription medicine daily to prevent HIV infection. This is called preexposure prophylaxis (PrEP). You are considered at risk if:  You are a man who has sex with other men (MSM) and have other risk factors.  You are a heterosexual man, are sexually active, and are at increased risk for HIV infection.  You take drugs by injection.  You are sexually active with a partner who has HIV.  Talk with your health care provider about whether you are at high risk of being infected with HIV. If you choose to begin PrEP, you should first be tested for HIV. You should then be tested every 3 months for as long as you are taking PrEP.  A one-time  screening for abdominal aortic aneurysm (AAA) and surgical repair of large AAAs by ultrasound are recommended for men ages 67 to 63 years who are current or former smokers.  Healthy men should no longer receive prostate-specific antigen (PSA) blood tests as part of routine cancer screening. Talk with your health care provider about prostate cancer screening.  Testicular cancer screening is not recommended for adult males who have no symptoms. Screening includes self-exam, a health care provider exam, and other screening tests. Consult with your health care provider about any symptoms you have or any concerns you have about testicular cancer.  Use sunscreen. Apply sunscreen liberally and repeatedly throughout the day. You should seek shade when your shadow is shorter than you. Protect yourself by wearing long sleeves, pants, a wide-brimmed hat, and sunglasses year round, whenever you are outdoors.  Once a month, do a whole-body skin exam, using a mirror to look at the skin on your back. Tell your health care provider about new moles, moles that have irregular borders, moles that are larger than a pencil eraser, or moles that have changed in shape or color.  Stay current with required vaccines (immunizations).  Influenza vaccine. All adults should be immunized every year.  Tetanus, diphtheria, and acellular pertussis (Td, Tdap) vaccine. An adult who has not previously received Tdap or who does not know his vaccine status should receive 1 dose of Tdap. This initial dose should be followed by tetanus and diphtheria toxoids (Td) booster doses every 10 years. Adults with an unknown or incomplete history of completing a 3-dose immunization series with Td-containing vaccines should begin or complete a primary immunization series including a Tdap dose. Adults should receive a Td booster every 10 years.  Varicella vaccine. An adult without evidence of immunity to varicella should receive 2 doses or a second  dose if he has previously received 1 dose.  Human papillomavirus (HPV) vaccine. Males aged 11-21 years who have not received the vaccine previously should receive the 3-dose series. Males aged 22-26 years may be immunized. Immunization is recommended through the age of 29 years for any male who has sex with males and did not get any or all doses earlier. Immunization is recommended for any person with an immunocompromised condition through the age of 23 years if he did not get any or all doses earlier. During the 3-dose series, the second dose should be obtained 4-8 weeks after the first  dose. The third dose should be obtained 24 weeks after the first dose and 16 weeks after the second dose.  Zoster vaccine. One dose is recommended for adults aged 66 years or older unless certain conditions are present.  Measles, mumps, and rubella (MMR) vaccine. Adults born before [birth year] generally are considered immune to measles and mumps. Adults born in [birth year] or later should have 1 or more doses of MMR vaccine unless there is a contraindication to the vaccine or there is laboratory evidence of immunity to each of the three diseases. A routine second dose of MMR vaccine should be obtained at least 28 days after the first dose for students attending postsecondary schools, health care workers, or international travelers. People who received inactivated measles vaccine or an unknown type of measles vaccine during 1963-1967 should receive 2 doses of MMR vaccine. People who received inactivated mumps vaccine or an unknown type of mumps vaccine before 1979 and are at high risk for mumps infection should consider immunization with 2 doses of MMR vaccine. Unvaccinated health care workers born before [birth year] who lack laboratory evidence of measles, mumps, or rubella immunity or laboratory confirmation of disease should consider measles and mumps immunization with 2 doses of MMR vaccine or rubella immunization with 1 dose of MMR  vaccine.  Pneumococcal 13-valent conjugate (PCV13) vaccine. When indicated, a person who is uncertain of his immunization history and has no record of immunization should receive the PCV13 vaccine. All adults 6 years of age and older should receive this vaccine. An adult aged 40 years or older who has certain medical conditions and has not been previously immunized should receive 1 dose of PCV13 vaccine. This PCV13 should be followed with a dose of pneumococcal polysaccharide (PPSV23) vaccine. Adults who are at high risk for pneumococcal disease should obtain the PPSV23 vaccine at least 8 weeks after the dose of PCV13 vaccine. Adults older than 50 years of age who have normal immune system function should obtain the PPSV23 vaccine dose at least 1 year after the dose of PCV13 vaccine.  Pneumococcal polysaccharide (PPSV23) vaccine. When PCV13 is also indicated, PCV13 should be obtained first. All adults aged 75 years and older should be immunized. An adult younger than age 88 years who has certain medical conditions should be immunized. Any person who resides in a nursing home or long-term care facility should be immunized. An adult smoker should be immunized. People with an immunocompromised condition and certain other conditions should receive both PCV13 and PPSV23 vaccines. People with human immunodeficiency virus (HIV) infection should be immunized as soon as possible after diagnosis. Immunization during chemotherapy or radiation therapy should be avoided. Routine use of PPSV23 vaccine is not recommended for American Indians, [place] Natives, or people younger than 65 years unless there are medical conditions that require PPSV23 vaccine. When indicated, people who have unknown immunization and have no record of immunization should receive PPSV23 vaccine. One-time revaccination 5 years after the first dose of PPSV23 is recommended for people aged 19-64 years who have chronic kidney failure, nephrotic syndrome,  asplenia, or immunocompromised conditions. People who received 1-2 doses of PPSV23 before age 76 years should receive another dose of PPSV23 vaccine at age 4 years or later if at least 5 years have passed since the previous dose. Doses of PPSV23 are not needed for people immunized with PPSV23 at or after age 62 years.  Meningococcal vaccine. Adults with asplenia or persistent complement component deficiencies should receive 2 doses of quadrivalent meningococcal conjugate (MenACWY-D)  vaccine. The doses should be obtained at least 2 months apart. Microbiologists working with certain meningococcal bacteria, [employer] recruits, people at risk during an outbreak, and people who travel to or live in countries with a high rate of meningitis should be immunized. A first-year college student up through age 69 years who is living in a residence hall should receive a dose if he did not receive a dose on or after his 16th birthday. Adults who have certain high-risk conditions should receive one or more doses of vaccine.  Hepatitis A vaccine. Adults who wish to be protected from this disease, have chronic liver disease, work with hepatitis A-infected animals, work in hepatitis A research labs, or travel to or work in countries with a high rate of hepatitis A should be immunized. Adults who were previously unvaccinated and who anticipate close contact with an international adoptee during the first 60 days after arrival in the [place] from a country with a high rate of hepatitis A should be immunized.  Hepatitis B vaccine. Adults should be immunized if they wish to be protected from this disease, are under age 54 years and have diabetes, have chronic liver disease, have had more than one sex partner in the past 6 months, may be exposed to blood or other infectious body fluids, are household contacts or sex partners of hepatitis B positive people, are clients or workers in certain care facilities, or travel to or work  in countries with a high rate of hepatitis B.  Haemophilus influenzae type b (Hib) vaccine. A previously unvaccinated person with asplenia or sickle cell disease or having a scheduled splenectomy should receive 1 dose of Hib vaccine. Regardless of previous immunization, a recipient of a hematopoietic stem cell transplant should receive a 3-dose series 6-12 months after his successful transplant. Hib vaccine is not recommended for adults with HIV infection. Preventive Service / Frequency Ages 56 to 41  Blood pressure check.** / Every 3-5 years.  Lipid and cholesterol check.** / Every 5 years beginning at age 107.  Hepatitis C blood test.** / For any individual with known risks for hepatitis C.  Skin self-exam. / Monthly.  Influenza vaccine. / Every year.  Tetanus, diphtheria, and acellular pertussis (Tdap, Td) vaccine.** / Consult your health care provider. 1 dose of Td every 10 years.  Varicella vaccine.** / Consult your health care provider.  HPV vaccine. / 3 doses over 6 months, if 71 or younger.  Measles, mumps, rubella (MMR) vaccine.** / You need at least 1 dose of MMR if you were born in 1957 or later. You may also need a second dose.  Pneumococcal 13-valent conjugate (PCV13) vaccine.** / Consult your health care provider.  Pneumococcal polysaccharide (PPSV23) vaccine.** / 1 to 2 doses if you smoke cigarettes or if you have certain conditions.  Meningococcal vaccine.** / 1 dose if you are age 91 to 44 years and a Market researcher living in a residence hall, or have one of several medical conditions. You may also need additional booster doses.  Hepatitis A vaccine.** / Consult your health care provider.  Hepatitis B vaccine.** / Consult your health care provider.  Haemophilus influenzae type b (Hib) vaccine.** / Consult your health care provider. Ages 57 to 77  Blood pressure check.** / Every year.  Lipid and cholesterol check.** / Every 5 years beginning at age  62.  Lung cancer screening. / Every year if you are aged 22-80 years and have a 30-pack-year history of smoking and currently smoke  or have quit within the past 15 years. Yearly screening is stopped once you have quit smoking for at least 15 years or develop a health problem that would prevent you from having lung cancer treatment.  Fecal occult blood test (FOBT) of stool. / Every year beginning at age 16 and continuing until age 33. You may not have to do this test if you get a colonoscopy every 10 years.  Flexible sigmoidoscopy** or colonoscopy.** / Every 5 years for a flexible sigmoidoscopy or every 10 years for a colonoscopy beginning at age 82 and continuing until age 68.  Hepatitis C blood test.** / For all people born from [birth year] through 1965 and any individual with known risks for hepatitis C.  Skin self-exam. / Monthly.  Influenza vaccine. / Every year.  Tetanus, diphtheria, and acellular pertussis (Tdap/Td) vaccine.** / Consult your health care provider. 1 dose of Td every 10 years.  Varicella vaccine.** / Consult your health care provider.  Zoster vaccine.** / 1 dose for adults aged 35 years or older.  Measles, mumps, rubella (MMR) vaccine.** / You need at least 1 dose of MMR if you were born in 1957 or later. You may also need a second dose.  Pneumococcal 13-valent conjugate (PCV13) vaccine.** / Consult your health care provider.  Pneumococcal polysaccharide (PPSV23) vaccine.** / 1 to 2 doses if you smoke cigarettes or if you have certain conditions.  Meningococcal vaccine.** / Consult your health care provider.  Hepatitis A vaccine.** / Consult your health care provider.  Hepatitis B vaccine.** / Consult your health care provider.  Haemophilus influenzae type b (Hib) vaccine.** / Consult your health care provider. Ages 47 and over  Blood pressure check.** / Every year.  Lipid and cholesterol check.**/ Every 5 years beginning at age 63.  Lung cancer screening. / Every  year if you are aged 20-80 years and have a 30-pack-year history of smoking and currently smoke or have quit within the past 15 years. Yearly screening is stopped once you have quit smoking for at least 15 years or develop a health problem that would prevent you from having lung cancer treatment.  Fecal occult blood test (FOBT) of stool. / Every year beginning at age 22 and continuing until age 45. You may not have to do this test if you get a colonoscopy every 10 years.  Flexible sigmoidoscopy** or colonoscopy.** / Every 5 years for a flexible sigmoidoscopy or every 10 years for a colonoscopy beginning at age 74 and continuing until age 81.  Hepatitis C blood test.** / For all people born from [birth year] through 1965 and any individual with known risks for hepatitis C.  Abdominal aortic aneurysm (AAA) screening.** / A one-time screening for ages 57 to 40 years who are current or former smokers.  Skin self-exam. / Monthly.  Influenza vaccine. / Every year.  Tetanus, diphtheria, and acellular pertussis (Tdap/Td) vaccine.** / 1 dose of Td every 10 years.  Varicella vaccine.** / Consult your health care provider.  Zoster vaccine.** / 1 dose for adults aged 78 years or older.  Pneumococcal 13-valent conjugate (PCV13) vaccine.** / 1 dose for all adults aged 35 years and older.  Pneumococcal polysaccharide (PPSV23) vaccine.** / 1 dose for all adults aged 59 years and older.  Meningococcal vaccine.** / Consult your health care provider.  Hepatitis A vaccine.** / Consult your health care provider.  Hepatitis B vaccine.** / Consult your health care provider.  Haemophilus influenzae type b (Hib) vaccine.** / Consult your health care provider. **Family  history and personal history of risk and conditions may change your health care provider's recommendations.   This information is not intended to replace advice given to you by your health care provider. Make sure you discuss any questions you have  with your health care provider.   Document Released: 09/29/2001 Document Revised: 08/24/2014 Document Reviewed: 12/29/2010 Elsevier Interactive Patient Education [employer].

## 2015-12-12 NOTE — Assessment & Plan Note (Addendum)
For your cpe will get hiv, cbc, cmp, tsh, lipid and will put in future psa(psa to be done when 50 yo.)  Referral for colonosocpy placed. UA done today as well.

## 2015-12-12 NOTE — Progress Notes (Signed)
Subjective:    Patient ID: Brian Gay, male    DOB: [birth year], 50 y.o.   MRN: [id number]  HPI   I have reviewed pt PMH, PSH, FH, Social History and Surgical History.  Pt diet is ok/moderate healthy.  He is still smoking. Cut back and now 2-3 cigaretes a day now.  Pt dad had prostate cancer in his 57's. Dad is still alive.   Will get t-dap today. Pt will get hiv screen today. He agreed ok to get test.      Review of Systems  Constitutional: Negative for fever, chills, diaphoresis, activity change and fatigue.  HENT: Negative for congestion, ear pain, postnasal drip, sinus pressure and sore throat.   Respiratory: Negative for cough, chest tightness and shortness of breath.   Cardiovascular: Negative for chest pain, palpitations and leg swelling.  Gastrointestinal: Negative for nausea, vomiting and abdominal pain.  Genitourinary: Negative for frequency, flank pain, penile swelling, difficulty urinating, penile pain and testicular pain.  Musculoskeletal: Negative for back pain, neck pain and neck stiffness.  Skin:       One week ago he pulled off tick from his left thigh medial aspect. Itching and mild rash present.   Neurological: Negative for dizziness, tremors, seizures, syncope, facial asymmetry, speech difficulty, weakness, light-headedness, numbness and headaches.  Hematological: Negative for adenopathy. Does not bruise/bleed easily.  Psychiatric/Behavioral: Negative for behavioral problems, confusion and agitation. The patient is not nervous/anxious.     Past Medical History  Diagnosis Date  . Hypertension   . Kidney stones      Social History   Social History  . Marital Status: Married    Spouse Name: Tammy  . Number of Children: 1  . Years of Education: 16   Occupational History  . 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 Smoking status: Current Every Day Smoker -- 0.25 packs/day for 20 years  . Smokeless tobacco:  Current User    Types: Chew  . Alcohol Use: No  . Drug Use: No  . Sexual Activity:    Partners: Female   Other Topics Concern  . Not on file   Social History Narrative   Married since 1984.    No past surgical history on file.  Family History  Problem Relation Age of Onset  . Heart disease Mother   . Hypertension Mother   . Heart disease Father   . Cancer Father     prostate  . Heart disease Maternal Grandmother   . Heart disease Maternal Grandfather   . Heart disease Paternal Grandmother   . Heart disease Paternal Grandfather     Allergies  Allergen Reactions  . Codeine Swelling    Current Outpatient Prescriptions on File Prior to Visit  Medication Sig Dispense Refill  . losartan (COZAAR) 50 MG tablet Take 1 tablet (50 mg total) by mouth daily. 90 tablet 1   No current facility-administered medications on file prior to visit.    BP 128/88 mmHg  Pulse 78  Temp(Src) 97.9 F (36.6 C) (Oral)  Ht 6' (1.829 m)  Wt 218 lb 9.6 oz (99.156 kg)  BMI 29.64 kg/m2  SpO2 96%       Objective:   Physical Exam  General Mental Status- Alert. General Appearance- Not in acute distress.   Skin General: Color- Normal Color. Moisture- Normal Moisture. No worrisome lesions on review.  Neck Carotid Arteries- Normal color. Moisture- Normal Moisture. No carotid bruits. No JVD.  Chest  and Lung Exam Auscultation: Breath Sounds:-Normal.  Cardiovascular Auscultation:Rythm- Regular. Murmurs & Other Heart Sounds:Auscultation of the heart reveals- No Murmurs.  Abdomen Inspection:-Inspeection Normal. Palpation/Percussion:Note:No mass. Palpation and Percussion of the abdomen reveal- Non Tender, Non Distended + BS, no rebound or guarding.  Neurologic Cranial Nerve exam:- CN III-XII intact(No nystagmus), symmetric smile. Drift Test:- No drift. Romberg Exam:- Negative.  Heal to Toe Gait exam:-Normal. Finger to Nose:- Normal/Intact Strength:- 5/5 equal and symmetric  strength both upper and lower extremities.  Male Genitourinary Urethra:- No discharge. Penis- Circumcised. Scrotum- No masses. Testes- Bilateral-Normal.  Rectal Anorectal Exam: Performed- Normal sphincter tone. No masses noted. Prostate smooth normal size. Stool HEME Negative.  Skin- left thigh. Small red circular area. Mild tender. No portion of tick seen.       Assessment & Plan:  Wellness examination For your cpe will get hiv, cbc, cmp, tsh, lipid and will put in future psa(psa to be done when 50 yo.)  Referral for colonosocpy placed. UA done today as well.     For tick bite will get tick bite labs and start doxycycline antibiotic.  Follow up date to be determined after lab review  Krupa Stege, Percell Miller, PA-C

## 2015-12-13 LAB — LYME ABY, WSTRN BLT IGG & IGM W/BANDS
B burgdorferi IgG Abs (IB): NEGATIVE
B burgdorferi IgM Abs (IB): NEGATIVE
LYME DISEASE 18 KD IGG: NONREACTIVE
LYME DISEASE 23 KD IGG: NONREACTIVE
LYME DISEASE 28 KD IGG: NONREACTIVE
LYME DISEASE 39 KD IGM: NONREACTIVE
LYME DISEASE 41 KD IGM: NONREACTIVE
LYME DISEASE 66 KD IGG: NONREACTIVE
Lyme Disease 23 kD IgM: REACTIVE — AB
Lyme Disease 30 kD IgG: NONREACTIVE
Lyme Disease 39 kD IgG: REACTIVE — AB
Lyme Disease 41 kD IgG: NONREACTIVE
Lyme Disease 45 kD IgG: NONREACTIVE
Lyme Disease 58 kD IgG: NONREACTIVE
Lyme Disease 93 kD IgG: NONREACTIVE

## 2015-12-13 LAB — LYME AB/WESTERN BLOT REFLEX

## 2015-12-16 LAB — ROCKY MTN SPOTTED FVR ABS PNL(IGG+IGM)
RMSF IGG: NOT DETECTED
RMSF IgM: NOT DETECTED

## 2015-12-16 LAB — RICKETTSIA TYPHUS AB, IGG AND IGM
R. TYPHI IGG: NOT DETECTED
R. TYPHI IGM: NOT DETECTED

## 2016-01-17 ENCOUNTER — Ambulatory Visit (AMBULATORY_SURGERY_CENTER): Payer: Self-pay | Admitting: *Deleted

## 2016-01-17 VITALS — Ht 71.0 in | Wt 214.0 lb

## 2016-01-17 DIAGNOSIS — Z1211 Encounter for screening for malignant neoplasm of colon: Secondary | ICD-10-CM

## 2016-01-17 MED ORDER — NA SULFATE-K SULFATE-MG SULF 17.5-3.13-1.6 GM/177ML PO SOLN: 1.0000 | Freq: Once | ORAL | Status: DC

## 2016-01-17 NOTE — Progress Notes (Signed)
No egg or soy allergy known to patient  No issues with past sedation with any surgeries  or procedures, no intubation problems  No diet pills per patient No home 02 use per patient  No blood thinners per patient  Pt denies issues with constipation  emmi declined

## 2016-01-21 ENCOUNTER — Encounter: Payer: Self-pay | Admitting: Gastroenterology

## 2016-01-31 ENCOUNTER — Encounter: Payer: Self-pay | Admitting: Gastroenterology

## 2016-01-31 ENCOUNTER — Telehealth: Payer: Self-pay | Admitting: Medical

## 2016-01-31 ENCOUNTER — Ambulatory Visit (AMBULATORY_SURGERY_CENTER): Payer: Commercial Managed Care - HMO | Admitting: Gastroenterology

## 2016-01-31 VITALS — BP 118/67 | HR 80 | Temp 98.6°F | Resp 20 | Ht 71.0 in | Wt 214.0 lb

## 2016-01-31 DIAGNOSIS — K621 Rectal polyp: Secondary | ICD-10-CM | POA: Diagnosis not present

## 2016-01-31 DIAGNOSIS — D128 Benign neoplasm of rectum: Secondary | ICD-10-CM

## 2016-01-31 DIAGNOSIS — D129 Benign neoplasm of anus and anal canal: Secondary | ICD-10-CM

## 2016-01-31 DIAGNOSIS — K635 Polyp of colon: Secondary | ICD-10-CM

## 2016-01-31 DIAGNOSIS — D120 Benign neoplasm of cecum: Secondary | ICD-10-CM | POA: Diagnosis not present

## 2016-01-31 DIAGNOSIS — D123 Benign neoplasm of transverse colon: Secondary | ICD-10-CM | POA: Diagnosis not present

## 2016-01-31 DIAGNOSIS — Z1211 Encounter for screening for malignant neoplasm of colon: Secondary | ICD-10-CM | POA: Diagnosis present

## 2016-01-31 DIAGNOSIS — D125 Benign neoplasm of sigmoid colon: Secondary | ICD-10-CM

## 2016-01-31 MED ORDER — SODIUM CHLORIDE 0.9 % IV SOLN: 500.0000 mL | INTRAVENOUS | Status: DC

## 2016-01-31 NOTE — Patient Instructions (Signed)
YOU HAD AN ENDOSCOPIC PROCEDURE TODAY AT THE [facility]:   Refer to the procedure report that was given to you for any specific questions about what was found during the examination.  If the procedure report does not answer your questions, please call your gastroenterologist to clarify.  If you requested that your care partner not be given the details of your procedure findings, then the procedure report has been included in a sealed envelope for you to review at your convenience later.  YOU SHOULD EXPECT: Some feelings of bloating in the abdomen. Passage of more gas than usual.  Walking can help get rid of the air that was put into your GI tract during the procedure and reduce the bloating. If you had a lower endoscopy (such as a colonoscopy or flexible sigmoidoscopy) you may notice spotting of blood in your stool or on the toilet paper. If you underwent a bowel prep for your procedure, you may not have a normal bowel movement for a few days.  Please Note:  You might notice some irritation and congestion in your nose or some drainage.  This is from the oxygen used during your procedure.  There is no need for concern and it should clear up in a day or so.  SYMPTOMS TO REPORT IMMEDIATELY:   Following lower endoscopy (colonoscopy or flexible sigmoidoscopy):  Excessive amounts of blood in the stool  Significant tenderness or worsening of abdominal pains  Swelling of the abdomen that is new, acute  Fever of 100F or higher   For urgent or emergent issues, a gastroenterologist can be reached at any hour by calling [phone].   DIET: Your first meal following the procedure should be a small meal and then it is ok to progress to your normal diet. Heavy or fried foods are harder to digest and may make you feel nauseous or bloated.  Likewise, meals heavy in dairy and vegetables can increase bloating.  Drink plenty of fluids but you should avoid alcoholic beverages for 24  hours.  ACTIVITY:  You should plan to take it easy for the rest of today and you should NOT DRIVE or use heavy machinery until tomorrow (because of the sedation medicines used during the test).    FOLLOW UP: Our staff will call the number listed on your records the next business day following your procedure to check on you and address any questions or concerns that you may have regarding the information given to you following your procedure. If we do not reach you, we will leave a message.  However, if you are feeling well and you are not experiencing any problems, there is no need to return our call.  We will assume that you have returned to your regular daily activities without incident.  If any biopsies were taken you will be contacted by phone or by letter within the next 1-3 weeks.  Please call us at [phone] if you have not heard about the biopsies in 3 weeks.    SIGNATURES/CONFIDENTIALITY: You and/or your care partner have signed paperwork which will be entered into your electronic medical record.  These signatures attest to the fact that that the information above on your After Visit Summary has been reviewed and is understood.  Full responsibility of the confidentiality of this discharge information lies with you and/or your care-partner.  Polyps-handout given  Repeat colonoscopy will be determined by pathology.  

## 2016-01-31 NOTE — Progress Notes (Signed)
To recovery, report bto RN, VSS 

## 2016-01-31 NOTE — Progress Notes (Signed)
Called to room to assist during endoscopic procedure.  Patient ID and intended procedure confirmed with present staff. Received instructions for my participation in the procedure from the performing physician.  

## 2016-01-31 NOTE — Telephone Encounter (Signed)
Pt had colonoscopy done recently. Will you abstract.

## 2016-01-31 NOTE — Op Note (Signed)
[facility] Patient Name: Brian Gay Procedure Date: 01/31/2016 7:30 AM MRN: [id number] Endoscopist: Milus Banister , MD Age: 50 Referring MD:  Date of Birth: 1966-02-18 Gender: Male Procedure:                Colonoscopy Indications:              Screening for colorectal malignant neoplasm, This                            is the patient's first colonoscopy Medicines:                Monitored Anesthesia Care Procedure:                Pre-Anesthesia Assessment:                           - Prior to the procedure, a History and Physical                            was performed, and patient medications and                            allergies were reviewed. The patient's tolerance of                            previous anesthesia was also reviewed. The risks                            and benefits of the procedure and the sedation                            options and risks were discussed with the patient.                            All questions were answered, and informed consent                            was obtained. Prior Anticoagulants: The patient has                            taken no previous anticoagulant or antiplatelet                            agents. ASA Grade Assessment: II - A patient with                            mild systemic disease. After reviewing the risks                            and benefits, the patient was deemed in                            satisfactory condition to undergo the procedure.  After obtaining informed consent, the colonoscope                            was passed under direct vision. Throughout the                            procedure, the patient's blood pressure, pulse, and                            oxygen saturations were monitored continuously. The                            Model CF-HQ190L [id number]) scope was introduced                            through the anus and advanced to the the cecum,                            identified by appendiceal orifice and ileocecal                            valve. The colonoscopy was performed without                            difficulty. The patient tolerated the procedure                            well. The quality of the bowel preparation was                            excellent. The ileocecal valve, appendiceal                            orifice, and rectum were photographed. Scope In: 7:34:01 AM Scope Out: 7:47:04 AM Scope Withdrawal Time: 0 hours 11 minutes 13 seconds  Total Procedure Duration: 0 hours 13 minutes 3 seconds  Findings:                 Four sessile polyps were found in the rectum,                            sigmoid colon, transverse colon and cecum. The                            polyps were 3 to 5 mm in size. These polyps were                            removed with a cold snare. Resection and retrieval                            were complete.                           The exam was otherwise without abnormality on  direct and retroflexion views. Complications:            No immediate complications. Estimated blood loss:                            None. Estimated Blood Loss:     Estimated blood loss: none. Impression:               - Four 3 to 5 mm polyps in the rectum, in the                            sigmoid colon, in the transverse colon and in the                            cecum, removed with a cold snare. Resected and                            retrieved.                           - The examination was otherwise normal on direct                            and retroflexion views. Recommendation:           - Patient has a contact number available for                            emergencies. The signs and symptoms of potential                            delayed complications were discussed with the                            patient. Return to normal activities tomorrow.                             Written discharge instructions were provided to the                            patient.                           - Resume previous diet.                           - Continue present medications.                           You will receive a letter within 2-3 weeks with the                            pathology results and my final recommendations.                           If the polyp(s) is proven to be 'pre-cancerous' on  pathology, you will need repeat colonoscopy in 3-5                            years. If the polyp(s) is NOT 'precancerous' on                            pathology then you should repeat colon cancer                            screening in 10 years with colonoscopy without need                            for colon cancer screening by any method prior to                            then (including stool testing). Milus Banister, MD 01/31/2016 7:49:14 AM This report has been signed electronically.

## 2016-02-03 ENCOUNTER — Telehealth: Payer: Self-pay | Admitting: *Deleted

## 2016-02-03 NOTE — Telephone Encounter (Signed)
The colonoscopy was abstracted 01/31/16 in the patients chart.

## 2016-02-03 NOTE — Telephone Encounter (Signed)
  Follow up Call-  Call back number 01/31/2016  Post procedure Call Back phone  # [phone]  Permission to leave phone message Yes     Patient questions:  Do you have a fever, pain , or abdominal swelling? No. Pain Score  0 *  Have you tolerated food without any problems? Yes.    Have you been able to return to your normal activities? Yes.    Do you have any questions about your discharge instructions: Diet   No. Medications  No. Follow up visit  No.  Do you have questions or concerns about your Care? No.  Actions: * If pain score is 4 or above: No action needed, pain <4.

## 2016-02-06 ENCOUNTER — Encounter: Payer: Self-pay | Admitting: Gastroenterology

## 2016-03-11 ENCOUNTER — Other Ambulatory Visit: Payer: Self-pay | Admitting: Occupational Medicine

## 2016-03-11 ENCOUNTER — Ambulatory Visit: Payer: Self-pay

## 2016-03-11 DIAGNOSIS — M79641 Pain in right hand: Secondary | ICD-10-CM

## 2016-03-16 DIAGNOSIS — S63639A Sprain of interphalangeal joint of unspecified finger, initial encounter: Secondary | ICD-10-CM | POA: Insufficient documentation

## 2016-03-16 DIAGNOSIS — S62624A Displaced fracture of medial phalanx of right ring finger, initial encounter for closed fracture: Secondary | ICD-10-CM | POA: Insufficient documentation

## 2016-03-17 ENCOUNTER — Other Ambulatory Visit: Payer: Self-pay | Admitting: Orthopedic Surgery

## 2016-03-17 ENCOUNTER — Encounter (HOSPITAL_BASED_OUTPATIENT_CLINIC_OR_DEPARTMENT_OTHER): Payer: Self-pay | Admitting: *Deleted

## 2016-03-18 ENCOUNTER — Encounter (HOSPITAL_BASED_OUTPATIENT_CLINIC_OR_DEPARTMENT_OTHER)
Admission: RE | Admit: 2016-03-18 | Discharge: 2016-03-18 | Disposition: A | Discharge status: Home / Self Care | Payer: Commercial Managed Care - HMO | Source: Ambulatory Visit | Attending: Orthopedic Surgery | Admitting: Orthopedic Surgery

## 2016-03-18 DIAGNOSIS — F17220 Nicotine dependence, chewing tobacco, uncomplicated: Secondary | ICD-10-CM | POA: Diagnosis not present

## 2016-03-18 DIAGNOSIS — S67194A Crushing injury of right ring finger, initial encounter: Secondary | ICD-10-CM | POA: Diagnosis not present

## 2016-03-18 DIAGNOSIS — S62624A Displaced fracture of medial phalanx of right ring finger, initial encounter for closed fracture: Secondary | ICD-10-CM | POA: Diagnosis present

## 2016-03-18 DIAGNOSIS — I10 Essential (primary) hypertension: Secondary | ICD-10-CM | POA: Diagnosis not present

## 2016-03-18 DIAGNOSIS — W230XXA Caught, crushed, jammed, or pinched between moving objects, initial encounter: Secondary | ICD-10-CM | POA: Diagnosis not present

## 2016-03-18 DIAGNOSIS — F17210 Nicotine dependence, cigarettes, uncomplicated: Secondary | ICD-10-CM | POA: Diagnosis not present

## 2016-03-19 ENCOUNTER — Ambulatory Visit (HOSPITAL_BASED_OUTPATIENT_CLINIC_OR_DEPARTMENT_OTHER): Payer: Worker's Compensation | Admitting: Anesthesiology

## 2016-03-19 ENCOUNTER — Encounter (HOSPITAL_BASED_OUTPATIENT_CLINIC_OR_DEPARTMENT_OTHER)
Admission: RE | Disposition: A | Discharge status: Home / Self Care | Payer: Self-pay | Source: Ambulatory Visit | Attending: Orthopedic Surgery

## 2016-03-19 ENCOUNTER — Ambulatory Visit (HOSPITAL_BASED_OUTPATIENT_CLINIC_OR_DEPARTMENT_OTHER)
Admission: RE | Admit: 2016-03-19 | Discharge: 2016-03-19 | Disposition: A | Discharge status: Home / Self Care | Payer: Worker's Compensation | Source: Ambulatory Visit | Attending: Orthopedic Surgery | Admitting: Orthopedic Surgery

## 2016-03-19 ENCOUNTER — Encounter (HOSPITAL_BASED_OUTPATIENT_CLINIC_OR_DEPARTMENT_OTHER): Payer: Self-pay | Admitting: Anesthesiology

## 2016-03-19 DIAGNOSIS — F17210 Nicotine dependence, cigarettes, uncomplicated: Secondary | ICD-10-CM | POA: Insufficient documentation

## 2016-03-19 DIAGNOSIS — S62624A Displaced fracture of medial phalanx of right ring finger, initial encounter for closed fracture: Secondary | ICD-10-CM | POA: Diagnosis not present

## 2016-03-19 DIAGNOSIS — I10 Essential (primary) hypertension: Secondary | ICD-10-CM | POA: Insufficient documentation

## 2016-03-19 DIAGNOSIS — W230XXA Caught, crushed, jammed, or pinched between moving objects, initial encounter: Secondary | ICD-10-CM | POA: Insufficient documentation

## 2016-03-19 DIAGNOSIS — F17220 Nicotine dependence, chewing tobacco, uncomplicated: Secondary | ICD-10-CM | POA: Insufficient documentation

## 2016-03-19 DIAGNOSIS — S67194A Crushing injury of right ring finger, initial encounter: Secondary | ICD-10-CM | POA: Insufficient documentation

## 2016-03-19 HISTORY — PX: CLOSED REDUCTION FINGER WITH PERCUTANEOUS PINNING: SHX5612

## 2016-03-19 SURGERY — CLOSED REDUCTION, FINGER, WITH PERCUTANEOUS PINNING
Anesthesia: General | Site: Finger | Laterality: Right

## 2016-03-19 MED ORDER — TRAMADOL HCL 50 MG PO TABS: 50.0000 mg | ORAL_TABLET | Freq: Four times a day (QID) | ORAL | 0 refills | Status: DC | PRN

## 2016-03-19 MED ORDER — BUPIVACAINE HCL (PF) 0.25 % IJ SOLN
INTRAMUSCULAR | Status: DC | PRN
  Administered 2016-03-19: 7 mL

## 2016-03-19 MED ORDER — FENTANYL CITRATE (PF) 100 MCG/2ML IJ SOLN: 25.0000 ug | INTRAMUSCULAR | Status: DC | PRN

## 2016-03-19 MED ORDER — LACTATED RINGERS IV SOLN
INTRAVENOUS | Status: DC | PRN
Start: 2016-03-19 — End: 2016-03-19
  Administered 2016-03-19: 08:00:00 via INTRAVENOUS

## 2016-03-19 MED ORDER — CEFAZOLIN SODIUM-DEXTROSE 2-4 GM/100ML-% IV SOLN
INTRAVENOUS | Status: AC
  Filled 2016-03-19: qty 100

## 2016-03-19 MED ORDER — MEPERIDINE HCL 25 MG/ML IJ SOLN: 6.2500 mg | INTRAMUSCULAR | Status: DC | PRN

## 2016-03-19 MED ORDER — MIDAZOLAM HCL 2 MG/2ML IJ SOLN: 1.0000 mg | INTRAMUSCULAR | Status: DC | PRN

## 2016-03-19 MED ORDER — CEFAZOLIN SODIUM-DEXTROSE 2-4 GM/100ML-% IV SOLN
2.0000 g | INTRAVENOUS | Status: AC
  Administered 2016-03-19: 2 g via INTRAVENOUS

## 2016-03-19 MED ORDER — CHLORHEXIDINE GLUCONATE 4 % EX LIQD
60.0000 mL | Freq: Once | CUTANEOUS | Status: DC
Start: 2016-03-19 — End: 2016-03-19

## 2016-03-19 MED ORDER — LIDOCAINE 2% (20 MG/ML) 5 ML SYRINGE
INTRAMUSCULAR | Status: AC
  Filled 2016-03-19: qty 5

## 2016-03-19 MED ORDER — DEXAMETHASONE SODIUM PHOSPHATE 4 MG/ML IJ SOLN
INTRAMUSCULAR | Status: DC | PRN
  Administered 2016-03-19: 10 mg via INTRAVENOUS

## 2016-03-19 MED ORDER — SCOPOLAMINE 1 MG/3DAYS TD PT72: 1.0000 | MEDICATED_PATCH | Freq: Once | TRANSDERMAL | Status: DC | PRN

## 2016-03-19 MED ORDER — GLYCOPYRROLATE 0.2 MG/ML IJ SOLN: 0.2000 mg | Freq: Once | INTRAMUSCULAR | Status: DC | PRN

## 2016-03-19 MED ORDER — FENTANYL CITRATE (PF) 100 MCG/2ML IJ SOLN
INTRAMUSCULAR | Status: AC
  Filled 2016-03-19: qty 2

## 2016-03-19 MED ORDER — MIDAZOLAM HCL 2 MG/2ML IJ SOLN
INTRAMUSCULAR | Status: AC
  Filled 2016-03-19: qty 2

## 2016-03-19 MED ORDER — LACTATED RINGERS IV SOLN
INTRAVENOUS | Status: DC
  Administered 2016-03-19: 08:00:00 via INTRAVENOUS

## 2016-03-19 MED ORDER — LACTATED RINGERS IV SOLN: INTRAVENOUS | Status: DC

## 2016-03-19 MED ORDER — METOCLOPRAMIDE HCL 5 MG/ML IJ SOLN: 10.0000 mg | Freq: Once | INTRAMUSCULAR | Status: DC | PRN

## 2016-03-19 MED ORDER — FENTANYL CITRATE (PF) 100 MCG/2ML IJ SOLN
INTRAMUSCULAR | Status: DC | PRN
  Administered 2016-03-19: 100 ug via INTRAVENOUS
  Administered 2016-03-19: 50 ug via INTRAVENOUS

## 2016-03-19 MED ORDER — DEXAMETHASONE SODIUM PHOSPHATE 10 MG/ML IJ SOLN
INTRAMUSCULAR | Status: AC
  Filled 2016-03-19: qty 1

## 2016-03-19 MED ORDER — FENTANYL CITRATE (PF) 100 MCG/2ML IJ SOLN: 50.0000 ug | INTRAMUSCULAR | Status: DC | PRN

## 2016-03-19 MED ORDER — ONDANSETRON HCL 4 MG/2ML IJ SOLN
INTRAMUSCULAR | Status: DC | PRN
  Administered 2016-03-19: 4 mg via INTRAVENOUS

## 2016-03-19 MED ORDER — BUPIVACAINE HCL (PF) 0.25 % IJ SOLN
INTRAMUSCULAR | Status: AC
Start: 2016-03-19 — End: 2016-03-19
  Filled 2016-03-19: qty 30

## 2016-03-19 MED ORDER — PROPOFOL 500 MG/50ML IV EMUL
INTRAVENOUS | Status: AC
  Filled 2016-03-19: qty 50

## 2016-03-19 MED ORDER — ONDANSETRON HCL 4 MG/2ML IJ SOLN
INTRAMUSCULAR | Status: AC
  Filled 2016-03-19: qty 2

## 2016-03-19 MED ORDER — MIDAZOLAM HCL 5 MG/5ML IJ SOLN
INTRAMUSCULAR | Status: DC | PRN
  Administered 2016-03-19: 2 mg via INTRAVENOUS

## 2016-03-19 MED ORDER — PROPOFOL 10 MG/ML IV BOLUS
INTRAVENOUS | Status: DC | PRN
  Administered 2016-03-19: 200 mg via INTRAVENOUS

## 2016-03-19 MED ORDER — LIDOCAINE HCL (CARDIAC) 20 MG/ML IV SOLN
INTRAVENOUS | Status: DC | PRN
  Administered 2016-03-19: 50 mg via INTRAVENOUS

## 2016-03-19 SURGICAL SUPPLY — 47 items
BLADE MINI RND TIP GREEN BEAV (BLADE) IMPLANT
BLADE SURG 15 STRL LF DISP TIS (BLADE) ×2 IMPLANT
BLADE SURG 15 STRL SS (BLADE) ×3
BNDG CMPR 9X4 STRL LF SNTH (GAUZE/BANDAGES/DRESSINGS) ×1
BNDG COHESIVE 3X5 TAN STRL LF (GAUZE/BANDAGES/DRESSINGS) ×4 IMPLANT
BNDG ESMARK 4X9 LF (GAUZE/BANDAGES/DRESSINGS) ×4 IMPLANT
BNDG GAUZE ELAST 4 BULKY (GAUZE/BANDAGES/DRESSINGS) ×4 IMPLANT
CHLORAPREP W/TINT 26ML (MISCELLANEOUS) ×4 IMPLANT
CORDS BIPOLAR (ELECTRODE) ×4 IMPLANT
COVER BACK TABLE 60X90IN (DRAPES) ×4 IMPLANT
COVER MAYO STAND STRL (DRAPES) ×4 IMPLANT
CUFF TOURNIQUET SINGLE 18IN (TOURNIQUET CUFF) ×4 IMPLANT
DECANTER SPIKE VIAL GLASS SM (MISCELLANEOUS) IMPLANT
DRAPE EXTREMITY T 121X128X90 (DRAPE) ×4 IMPLANT
DRAPE OEC MINIVIEW 54X84 (DRAPES) ×4 IMPLANT
DRAPE SURG 17X23 STRL (DRAPES) ×4 IMPLANT
GAUZE SPONGE 4X4 12PLY STRL (GAUZE/BANDAGES/DRESSINGS) ×4 IMPLANT
GAUZE XEROFORM 1X8 LF (GAUZE/BANDAGES/DRESSINGS) ×4 IMPLANT
GLOVE BIO SURGEON STRL SZ 6.5 (GLOVE) ×3 IMPLANT
GLOVE BIO SURGEONS STRL SZ 6.5 (GLOVE) ×1
GLOVE BIOGEL PI IND STRL 7.0 (GLOVE) ×4 IMPLANT
GLOVE BIOGEL PI IND STRL 8.5 (GLOVE) ×2 IMPLANT
GLOVE BIOGEL PI INDICATOR 7.0 (GLOVE) ×4
GLOVE BIOGEL PI INDICATOR 8.5 (GLOVE) ×2
GLOVE SURG ORTHO 8.0 STRL STRW (GLOVE) ×4 IMPLANT
GOWN STRL REUS W/ TWL LRG LVL3 (GOWN DISPOSABLE) IMPLANT
GOWN STRL REUS W/TWL LRG LVL3 (GOWN DISPOSABLE)
GOWN STRL REUS W/TWL XL LVL3 (GOWN DISPOSABLE) ×4 IMPLANT
NEEDLE PRECISIONGLIDE 27X1.5 (NEEDLE) IMPLANT
NS IRRIG 1000ML POUR BTL (IV SOLUTION) ×4 IMPLANT
PACK BASIN DAY SURGERY FS (CUSTOM PROCEDURE TRAY) ×4 IMPLANT
PAD CAST 3X4 CTTN HI CHSV (CAST SUPPLIES) ×2 IMPLANT
PADDING CAST ABS 4INX4YD NS (CAST SUPPLIES) ×2
PADDING CAST ABS COTTON 4X4 ST (CAST SUPPLIES) ×2 IMPLANT
PADDING CAST COTTON 3X4 STRL (CAST SUPPLIES) ×4
SLEEVE SCD COMPRESS KNEE MED (MISCELLANEOUS) IMPLANT
SPLINT PLASTER CAST XFAST 3X15 (CAST SUPPLIES) ×20 IMPLANT
SPLINT PLASTER XTRA FASTSET 3X (CAST SUPPLIES) ×20
STOCKINETTE 4X48 STRL (DRAPES) ×4 IMPLANT
SUT CHROMIC 5 0 P 3 (SUTURE) IMPLANT
SUT ETHILON 4 0 PS 2 18 (SUTURE) ×4 IMPLANT
SUT MERSILENE 4 0 P 3 (SUTURE) IMPLANT
SUT VICRYL 4-0 PS2 18IN ABS (SUTURE) IMPLANT
SYR BULB 3OZ (MISCELLANEOUS) ×4 IMPLANT
SYR CONTROL 10ML LL (SYRINGE) IMPLANT
TOWEL OR 17X24 6PK STRL BLUE (TOWEL DISPOSABLE) ×4 IMPLANT
UNDERPAD 30X30 (UNDERPADS AND DIAPERS) ×4 IMPLANT

## 2016-03-19 NOTE — Op Note (Signed)
NAMESHERON, ORTT                 ACCOUNT NO.:  [id number]  MEDICAL RECORD NO.:  [id number]  LOCATION:                                 FACILITY:  PHYSICIAN:  Daryll Brod, M.D.            DATE OF BIRTH:  DATE OF PROCEDURE:  03/19/2016 DATE OF DISCHARGE:                              OPERATIVE REPORT   PREOPERATIVE DIAGNOSIS:  Fracture of the middle phalanx, right ring finger.  POSTOPERATIVE DIAGNOSIS:  Fracture of the middle phalanx, right ring finger.  OPERATION:  Closed reduction, percutaneous pinning, middle phalanx fracture right ring finger.  SURGEON:  Daryll Brod, MD.  ANESTHESIA:  General with metacarpal block.  PLACE OF SURGERY:  [facility].  ANESTHESIOLOGISTS:  Montez Hageman, MD.  HISTORY:  The patient is a 50 year old male who suffered a crush injury to his ring and small finger of right hand.  He was placed in a splint. The fracture was initially nondisplaced.  He suffered a second injury. He had x-rays taken revealing a displaced fracture of his ring finger middle phalanx with volar angulation.  He is admitted now for closed, possible open reduction and internal fixation right ring finger middle phalanx.  He is aware that there is no guarantee with the surgery; the possibility of infection; recurrence of injury to arteries, nerves, tendons; incomplete relief of symptoms; dystrophy.  In the preoperative area, the patient was seen, the extremity was marked by both the patient and surgeon.  Antibiotic given.  PROCEDURE IN DETAIL:  The patient was brought to the operating room, where a general anesthetic was carried out without difficulty.  He was prepped using ChloraPrep, supine position with the right arm free.  The image intensifier was brought into position.  A manipulation was performed to the middle phalanx of his right ring finger.  The fracture was reduced.  A 0.35 K-wire was then introduced from the proximal aspect of the proximal fragment and  driven across the fracture site into the middle phalanx.  This intersected into the cortical bone and firmly fixed.  A second Crosswire from the ulnar side was then inserted.  This was done from the proximal fragment and placed distally through the medullary canal.  This was driven as so as to interrogate the cortex distally.  X-rays, AP and lateral direction revealed that the fracture was well reduced in both AP and lateral directions.  With flexion of the fingers there was no rotatory angulatory deformity noted.  The pins were bent, cut short.  A metacarpal block was then given with 0.25% bupivacaine without epinephrine, approximately 8 mL total was used.  A sterile compressive dressing, dorsal palmar splint was applied to the middle ring and small fingers.  Tourniquet was not inflated through the procedure.  He was taken to the recovery room for observation in satisfactory condition. He will be discharged home to [place] in 1 week, on Ultram.          ______________________________ Daryll Brod, M.D.     GK/MEDQ  D:  03/19/2016  T:  03/19/2016  Job:  [id number]

## 2016-03-19 NOTE — Discharge Instructions (Addendum)

## 2016-03-19 NOTE — Anesthesia Preprocedure Evaluation (Signed)
Anesthesia Evaluation  Patient identified by MRN, date of birth, ID band Patient awake    Reviewed: Allergy & Precautions, NPO status , Patient's Chart, lab work & pertinent test results  Airway Mallampati: II  TM Distance: >3 FB Neck ROM: Full    Dental no notable dental hx. (+) Caps   Pulmonary neg pulmonary ROS, Current Smoker,    Pulmonary exam normal breath sounds clear to auscultation       Cardiovascular hypertension, Pt. on medications negative cardio ROS Normal cardiovascular exam Rhythm:Regular Rate:Normal     Neuro/Psych negative neurological ROS  negative psych ROS   GI/Hepatic negative GI ROS, Neg liver ROS,   Endo/Other  negative endocrine ROS  Renal/GU negative Renal ROS  negative genitourinary   Musculoskeletal negative musculoskeletal ROS (+)   Abdominal   Peds negative pediatric ROS (+)  Hematology negative hematology ROS (+)   Anesthesia Other Findings   Reproductive/Obstetrics negative OB ROS                             Anesthesia Physical Anesthesia Plan  ASA: II  Anesthesia Plan: General   Post-op Pain Management:    Induction: Intravenous  Airway Management Planned: LMA  Additional Equipment:   Intra-op Plan:   Post-operative Plan: Extubation in OR  Informed Consent: I have reviewed the patients History and Physical, chart, labs and discussed the procedure including the risks, benefits and alternatives for the proposed anesthesia with the patient or authorized representative who has indicated his/her understanding and acceptance.   Dental advisory given  Plan Discussed with: CRNA  Anesthesia Plan Comments:         Anesthesia Quick Evaluation

## 2016-03-19 NOTE — Op Note (Signed)
Dictation Number [id number]

## 2016-03-19 NOTE — Brief Op Note (Signed)
03/19/2016  9:12 AM  PATIENT:  Brian Gay  50 y.o. male  PRE-OPERATIVE DIAGNOSIS:  fracture right ring finger  POST-OPERATIVE DIAGNOSIS:  fracture right ring finger  PROCEDURE:  Procedure(s): CLOSED REDUCTION right ring  FINGER (Right)  SURGEON:  Surgeon(s) and Role:    * Daryll Brod, MD - Primary  PHYSICIAN ASSISTANT:   ASSISTANTS: none   ANESTHESIA:   local and general  EBL:  Total I/O In: -  Out: 2 [Blood:2]  BLOOD ADMINISTERED:none  DRAINS: none   LOCAL MEDICATIONS USED:  BUPIVICAINE   SPECIMEN:  No Specimen  DISPOSITION OF SPECIMEN:  N/A  COUNTS:  YES  TOURNIQUET:  * No tourniquets in log *  DICTATION: .Other Dictation: Dictation Number [id number]  PLAN OF CARE: Discharge to home after PACU  PATIENT DISPOSITION:  PACU - hemodynamically stable.

## 2016-03-19 NOTE — H&P (Signed)
Brian Gay is an 50 y.o. male.   Chief Complaint: pain right ring finger [id number] a 50 year old right left-hand dominant male who sustained an injury to his right hand ring and small fingers when a fellow employee was on a ladder that slipped and fell he tried to break his fall, was caught between a table and the pain the workers back to the injury occurred roughly 03/10/2016. He went to med. Placed in an AlumaFoam splint was then referred for continued care. Planes of pain in his ring finger with a BAL score of 5/10 this is throbbing in nature. He has tried taking ibuprofen which has not given him any significant relief. States that he has had further injuries to it since that fall per is not complaining of significant discomfort to the small finger. X-rays done at the time of injury revealing a nondisplaced fracture of the middle phalanx of his ring finger and a volar plate type injury to his small finger.X-rays his original injury reveal a nondisplaced fracture of his middle finger right ring and a volar plate injury to the small finger PIP these are read by Dr. Christen Bame repeat x-rays today of his ring finger reveal that the fracture has displaced of his right ring finger is now volarly angulated.      History reviewed. No pertinent past medical history.  History reviewed. No pertinent surgical history.      Past Medical History:  Diagnosis Date  . Allergy   . Hypertension   . Kidney stones     Past Surgical History:  Procedure Laterality Date  . cyst removed      pilonidal cyst  . WISDOM TOOTH EXTRACTION      Family History  Problem Relation Age of Onset  . Heart disease Mother   . Hypertension Mother   . Heart disease Father   . Cancer Father     prostate  . Prostate cancer Father   . Heart disease Maternal Grandmother   . Heart disease Maternal Grandfather   . Heart disease Paternal Grandmother   . Heart disease Paternal Grandfather   . Colon cancer Neg Hx   .  Colon polyps Neg Hx   . Rectal cancer Neg Hx   . Stomach cancer Neg Hx    Social History:  reports that he has been smoking.  He has a 10.00 pack-year smoking history. His smokeless tobacco use includes Chew. He reports that he drinks alcohol. He reports that he does not use drugs.  Allergies:  Allergies  Allergen Reactions  . Codeine Swelling    No prescriptions prior to admission.    No results found for this or any previous visit (from the past 48 hour(s)).  No results found.   Pertinent items are noted in HPI.  Height 5\' 11"  (1.803 m), weight 98.9 kg (218 lb).  General appearance: alert, cooperative and appears stated age Head: Normocephalic, without obvious abnormality Neck: no JVD Resp: clear to auscultation bilaterally Cardio: regular rate and rhythm, S1, S2 normal, no murmur, click, rub or gallop GI: soft, non-tender; bowel sounds normal; no masses,  no organomegaly Extremities: swollen rrf  Pulses: 2+ and symmetric Skin: Skin color, texture, turgor normal. No rashes or lesions Neurologic: Grossly normal Incision/Wound: na  Assessment/Plan Assessment:   Volar plate injury of finger, Closed displaced fracture of middle phalanx of right ring finger, initial encounter    Plan: Plan: We have recommended close percutaneous pinning possible open reduction internal fixation of his ring finger.  Pre-peri-and postoperative course have been discussed along with risks and complications. This will be scheduled as an outpatient under regional anesthesia.       Betzaira Mentel R 03/19/2016, 4:58 AM

## 2016-03-19 NOTE — Transfer of Care (Signed)
Immediate Anesthesia Transfer of Care Note  Patient: Ye Rocchi  Procedure(s) Performed: Procedure(s): CLOSED REDUCTION right ring  FINGER (Right)  Patient Location: PACU  Anesthesia Type:General  Level of Consciousness: sedated  Airway & Oxygen Therapy: Patient Spontanous Breathing and Patient connected to face mask oxygen  Post-op Assessment: Report given to RN and Post -op Vital signs reviewed and stable  Post vital signs: Reviewed and stable  Last Vitals:  Vitals:   03/19/16 0716  BP: (!) 133/94  Pulse: 64  Resp: 18  Temp: 36.6 C    Last Pain:  Vitals:   03/19/16 0716  TempSrc: Oral  PainSc: 8       Patients Stated Pain Goal: 2 (Q000111Q [id number])  Complications: No apparent anesthesia complications

## 2016-03-19 NOTE — Anesthesia Procedure Notes (Signed)
Procedure Name: LMA Insertion Date/Time: 03/19/2016 8:41 AM Performed by: Toula Moos L Pre-anesthesia Checklist: Patient identified, Emergency Drugs available, Suction available, Patient being monitored and Timeout performed Patient Re-evaluated:Patient Re-evaluated prior to inductionOxygen Delivery Method: Circle system utilized Preoxygenation: Pre-oxygenation with 100% oxygen Intubation Type: IV induction Ventilation: Mask ventilation without difficulty LMA: LMA inserted LMA Size: 5.0 Number of attempts: 1 Airway Equipment and Method: Bite block Placement Confirmation: positive ETCO2 Tube secured with: Tape Dental Injury: Teeth and Oropharynx as per pre-operative assessment

## 2016-03-20 ENCOUNTER — Encounter (HOSPITAL_BASED_OUTPATIENT_CLINIC_OR_DEPARTMENT_OTHER): Payer: Self-pay | Admitting: Orthopedic Surgery

## 2016-03-24 NOTE — Anesthesia Postprocedure Evaluation (Signed)
Anesthesia Post Note  Patient: Brian Gay  Procedure(Gay) Performed: Procedure(Gay) (LRB): CLOSED REDUCTION right ring  FINGER (Right)  Patient location during evaluation: PACU Anesthesia Type: General Level of consciousness: awake and alert Pain management: pain level controlled Vital Signs Assessment: post-procedure vital signs reviewed and stable Respiratory status: spontaneous breathing, nonlabored ventilation, respiratory function stable and patient connected to nasal cannula oxygen Cardiovascular status: blood pressure returned to baseline and stable Postop Assessment: no signs of nausea or vomiting Anesthetic complications: no    Last Vitals:  Vitals:   03/19/16 0940 03/19/16 0952  BP: 122/90 118/79  Pulse: 67 79  Resp: (!) 22 20  Temp:  36.6 C    Last Pain:  Vitals:   03/20/16 0958  TempSrc:   PainSc: 2                  Brian Gay

## 2016-07-01 ENCOUNTER — Telehealth: Payer: Self-pay | Admitting: Medical

## 2016-07-01 MED ORDER — LOSARTAN POTASSIUM 50 MG PO TABS: 50.0000 mg | ORAL_TABLET | Freq: Every day | ORAL | 0 refills | Status: DC

## 2016-07-01 NOTE — Telephone Encounter (Signed)
I left a message for the patient to call back with any questions about his Cozaar that was sent to [facility] on [address].  The patient is due for a BP check with in the next 2 months.

## 2016-07-01 NOTE — Telephone Encounter (Signed)
Relation to [id number] Call back [phone] Pharmacy: [facility] ([address]  - [place] [phone] (Phone) [phone] (Fax)      Reason for call:  Patient requesting a refill losartan (COZAAR) 50 MG tablet

## 2016-08-04 ENCOUNTER — Ambulatory Visit (INDEPENDENT_AMBULATORY_CARE_PROVIDER_SITE_OTHER): Payer: Commercial Managed Care - HMO | Admitting: Family Medicine

## 2016-08-04 ENCOUNTER — Encounter: Payer: Self-pay | Admitting: Family Medicine

## 2016-08-04 VITALS — BP 113/73 | HR 97 | Ht 72.0 in | Wt 216.7 lb

## 2016-08-04 DIAGNOSIS — J3089 Other allergic rhinitis: Secondary | ICD-10-CM | POA: Diagnosis not present

## 2016-08-04 DIAGNOSIS — E663 Overweight: Secondary | ICD-10-CM | POA: Diagnosis not present

## 2016-08-04 DIAGNOSIS — E669 Obesity, unspecified: Secondary | ICD-10-CM | POA: Insufficient documentation

## 2016-08-04 DIAGNOSIS — Z0000 Encounter for general adult medical examination without abnormal findings: Secondary | ICD-10-CM

## 2016-08-04 DIAGNOSIS — R5383 Other fatigue: Secondary | ICD-10-CM

## 2016-08-04 DIAGNOSIS — Z716 Tobacco abuse counseling: Secondary | ICD-10-CM

## 2016-08-04 DIAGNOSIS — Z1389 Encounter for screening for other disorder: Secondary | ICD-10-CM

## 2016-08-04 DIAGNOSIS — F17200 Nicotine dependence, unspecified, uncomplicated: Secondary | ICD-10-CM

## 2016-08-04 DIAGNOSIS — I10 Essential (primary) hypertension: Secondary | ICD-10-CM

## 2016-08-04 NOTE — Progress Notes (Signed)
New patient office visit note:  Impression and Recommendations:    1. Physical exam   2. Essential hypertension   3. Overweight (BMI 25.0-29.9)   4. Environmental and seasonal allergies   5. Other fatigue   6. Screening for multiple conditions   7. Tobacco use disorder   8. Tobacco abuse counseling     Tobacco use disorder 35+ yrs - 1/2 ppd.    - thought about quitting, then forgot about it- not interested in quitting now.   - told pt if/ when ready to, come in for OV to discuss options  HTN (hypertension) - obtain cmp near future.   Lifestyle changes such as dash diet and engaging in a regular exercise program discussed with patient.   Educational handouts provided  Ambulatory BP monitoring encouraged. Keep log and bring in next OV  Continue current medication(s)- BP well controlled.     Contact us prior with any Q's/ concerns.  Environmental and seasonal allergies Has allergies all year long.   Takes nasal spray some days- not sure what.   Fatigue- esp AM D/c pt about possible OSA condition, insomnia etc.    Pt declines Sleep Study - but will think about it.  - Obtain CBC, TSH, A1C, CMP, Vit D, HIV, Hep C  - d/c pt healthier lifestyle habits to help with energy levels.     Orders Placed This Encounter  Procedures  . CBC with Differential/Platelet  . Lipid panel  . TSH  . T4, free  . VITAMIN D 25 Hydroxy (Vit-D Deficiency, Fractures)  . Hepatitis C antibody  . HIV antibody     New Prescriptions   No medications on file    Modified Medications   No medications on file    Discontinued Medications   TRAMADOL (ULTRAM) 50 MG TABLET    Take 1 tablet (50 mg total) by mouth every 6 (six) hours as needed.     The patient was counseled, risk factors were discussed, anticipatory guidance given.  Gross side effects, risk and benefits, and alternatives of medications discussed with patient.  Patient is aware that all medications have  potential side effects and we are unable to predict every side effect or drug-drug interaction that may occur.  Expresses verbal understanding and consents to current therapy plan and treatment regimen.  Return in about 3 months (around 11/02/2016) for near future FBW, then OV with me 3-4 mo HTN, Smoking, wt loss; sooner if  lab ABN.  Please see AVS handed out to patient at the end of our visit for further patient instructions/ counseling done pertaining to today's office visit.    Note: This document was prepared using Dragon voice recognition software and may include unintentional dictation errors.  ----------------------------------------------------------------------------------------------------------------------    Subjective:    Chief Complaint  Patient presents with  . Establish Care    HPI: Brian Gay is a pleasant 50 y.o. male who presents to [place] at [facility] today to review their medical history with me and establish care.   I asked the patient to review their chronic problem list with me to ensure everything was updated and accurate.    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   He is married to his wife Brian Gay.  They have 96 child, 41 years old.  HTN:  Patient has had the diagnosis for approximately 1 year or so now.  Has only been on the 1 blood pressure medicine-losartan.  Tolerates well.  No side effects.  No symptoms.   Does not check at home   CC:  Wakes up feeling exhausted- all the time, for a couple years now- most prominent in AM's.   No exercise, poor diet, overwt, sleeping ok.    ROS completely negative excpt above.   Current smoker- 1/2 PPD * 35+ yrs.  Not interested in hearing about quitting/ strategies etc       Wt Readings from Last 3 Encounters:  08/04/16 216 lb 11.2 oz (98.3 kg)  03/19/16 212 lb (96.2 kg)  01/31/16 214 lb (97.1 kg)   BP Readings from Last 3  Encounters:  08/04/16 113/73  03/19/16 118/79  01/31/16 118/67   Pulse Readings from Last 3 Encounters:  08/04/16 97  03/19/16 79  01/31/16 80   BMI Readings from Last 3 Encounters:  08/04/16 29.39 kg/m  03/19/16 29.57 kg/m  01/31/16 29.85 kg/m    Patient Care Team    Relationship Specialty Notifications Start End  [name], DO PCP - General Family Medicine  08/04/16   Dds Alicia Amel, MD Consulting Physician Dentistry  12/11/15     Patient Active Problem List   Diagnosis Date Noted  . Tobacco use disorder 08/04/2016    Priority: High  . HTN (hypertension) 10/10/2015    Priority: High  . Overweight (BMI 25.0-29.9) 08/04/2016    Priority: Medium  . Environmental and seasonal allergies 08/04/2016    Priority: Medium  . Tobacco abuse counseling 09/05/2016  . Fatigue- esp AM 08/04/2016  . Wellness examination 12/12/2015  . Allergic rhinitis 11/02/2012     Past Medical History:  Diagnosis Date  . Allergy   . Hypertension   . Kidney stones      Past Medical History:  Diagnosis Date  . Allergy   . Hypertension   . Kidney stones      Past Surgical History:  Procedure Laterality Date  . CLOSED REDUCTION FINGER WITH PERCUTANEOUS PINNING Right 03/19/2016   Procedure: CLOSED REDUCTION right ring  FINGER;  Surgeon: Daryll Brod, MD;  Location: [facility];  Service: Orthopedics;  Laterality: Right;  . cyst removed      pilonidal cyst  . WISDOM TOOTH EXTRACTION       Family History  Problem Relation Age of Onset  . Heart disease Mother   . Hypertension Mother   . Heart disease Father   . Cancer Father     prostate  . Prostate cancer Father   . Hyperlipidemia Father   . Stroke Father   . Heart disease Maternal Grandmother   . Heart disease Maternal Grandfather   . Heart disease Paternal Grandmother   . Heart disease Paternal Grandfather   . Colon cancer Neg Hx   . Colon polyps Neg Hx   . Rectal cancer Neg Hx   . Stomach cancer Neg  Hx      History  Drug Use No    History  Alcohol Use  . 0.0 oz/week    Comment: occasional    History  Smoking Status  . Current Every Day Smoker  . Packs/day: 0.50  . Years: 20.00  Smokeless Tobacco  . Current User  . Types: Chew     Patient's Medications  New Prescriptions   No medications on file  Previous Medications   LOSARTAN (COZAAR) 50 MG TABLET    Take 1 tablet (50 mg total) by mouth daily.  Modified Medications   No medications on file  Discontinued Medications   TRAMADOL (ULTRAM) 50 MG TABLET    Take 1 tablet (50 mg total) by mouth every 6 (six) hours as needed.    Allergies: Codeine  Review of Systems  Constitutional: Negative for chills and fever.  Respiratory: Positive for cough. Negative for shortness of breath.   Cardiovascular: Negative for chest pain and palpitations.  Gastrointestinal: Negative for nausea and vomiting.  Genitourinary: Negative for dysuria and frequency.  Neurological: Negative for dizziness.     Objective:   Blood pressure 113/73, pulse 97, height 6' (1.829 m), weight 216 lb 11.2 oz (98.3 kg), SpO2 95 %. Body mass index is 29.39 kg/m. General: Well Developed, well nourished, and in no acute distress.  Neuro: Alert and oriented x3, extra-ocular muscles intact, sensation grossly intact.  HEENT: Normocephalic, atraumatic, pupils equal round reactive to light, neck supple, no jvd, no bruits Skin: no gross rashes  Cardiac: Regular rate and rhythm Respiratory: Essentially clear to auscultation bilaterally. Not using accessory muscles, speaking in full sentences.  Abdominal: not grossly distended Musculoskeletal: Ambulates w/o diff, FROM * 4 ext.  Vasc: less 2 sec cap RF, warm and pink  Psych:  No HI/SI, judgement and insight good, Euthymic mood. Full Affect.

## 2016-08-04 NOTE — Assessment & Plan Note (Addendum)
-   obtain cmp near future.   Lifestyle changes such as dash diet and engaging in a regular exercise program discussed with patient.   Educational handouts provided  Ambulatory BP monitoring encouraged. Keep log and bring in next OV  Continue current medication(s)- BP well controlled.     Contact us prior with any Q's/ concerns.

## 2016-08-04 NOTE — Assessment & Plan Note (Addendum)
35+ yrs - 1/2 ppd.    - thought about quitting, then forgot about it- not interested in quitting now.   - told pt if/ when ready to, come in for OV to discuss options

## 2016-08-04 NOTE — Assessment & Plan Note (Signed)
Has allergies all year long.   Takes nasal spray some days- not sure what.

## 2016-08-04 NOTE — Patient Instructions (Addendum)
Please think seriously about quitting smoking!  This is very important for your health and well being.    There are many things we can do to help you quit.  Let us know if you are interested.  You can also call 1-800-QUIT-NOW 863-247-2114) for free smoking cessation counseling and support. And please go online to www.heart.org to the American Heart Association website and search "quit smoking ".   There is a lot of great information on this website for you to look over.      If you have insomnia or difficulty sleeping, this information is for you:  - Avoid caffeinated beverages after lunch,  no alcoholic beverages,  no eating within 2-3 hours of lying down,  avoid exposure to blue light before bed,  avoid daytime naps, and  needs to maintain a regular sleep schedule- go to sleep and wake up around the same time every night.   - Resolve concerns or worries before entering bedroom:  Discussed relaxation techniques with patient and to keep a journal to write down fears\ worries.  I suggested seeing a counselor for CBT.   - Recommend patient meditate or do deep breathing exercises to help relax.   Incorporate the use of white noise machines or listen to "sleep meditation music", or recordings of guided meditations for sleep from YouTube which are free, such as  "guided meditation for detachment from over thinking"  by Mayford Knife.      Sleep Apnea Sleep apnea is a condition in which breathing pauses or becomes shallow during sleep. Episodes of sleep apnea usually last 10 seconds or longer, and they may occur as many as 20 times an hour. Sleep apnea disrupts your sleep and keeps your body from getting the rest that it needs. This condition can increase your risk of certain health problems, including:  Heart attack.  Stroke.  Obesity.  Diabetes.  Heart failure.  Irregular heartbeat. There are three kinds of sleep apnea:  Obstructive sleep apnea. This kind is caused by a blocked or  collapsed airway.  Central sleep apnea. This kind happens when the part of the brain that controls breathing does not send the correct signals to the muscles that control breathing.  Mixed sleep apnea. This is a combination of obstructive and central sleep apnea. What are the causes? The most common cause of this condition is a collapsed or blocked airway. An airway can collapse or become blocked if:  Your throat muscles are abnormally relaxed.  Your tongue and tonsils are larger than normal.  You are overweight.  Your airway is smaller than normal. What increases the risk? This condition is more likely to develop in people who:  Are overweight.  Smoke.  Have a smaller than normal airway.  Are elderly.  Are male.  Drink alcohol.  Take sedatives or tranquilizers.  Have a family history of sleep apnea. What are the signs or symptoms? Symptoms of this condition include:  Trouble staying asleep.  Daytime sleepiness and tiredness.  Irritability.  Loud snoring.  Morning headaches.  Trouble concentrating.  Forgetfulness.  Decreased interest in sex.  Unexplained sleepiness.  Mood swings.  Personality changes.  Feelings of depression.  Waking up often during the night to urinate.  Dry mouth.  Sore throat. How is this diagnosed? This condition may be diagnosed with:  A medical history.  A physical exam.  A series of tests that are done while you are sleeping (sleep study). These tests are usually done in a sleep  lab, but they may also be done at home. How is this treated? Treatment for this condition aims to restore normal breathing and to ease symptoms during sleep. It may involve managing health issues that can affect breathing, such as high blood pressure or obesity. Treatment may include:  Sleeping on your side.  Using a decongestant if you have nasal congestion.  Avoiding the use of depressants, including alcohol, sedatives, and  narcotics.  Losing weight if you are overweight.  Making changes to your diet.  Quitting smoking.  Using a device to open your airway while you sleep, such as:  An oral appliance. This is a custom-made mouthpiece that shifts your lower jaw forward.  A continuous positive airway pressure (CPAP) device. This device delivers oxygen to your airway through a mask.  A nasal expiratory positive airway pressure (EPAP) device. This device has valves that you put into each nostril.  A bi-level positive airway pressure (BPAP) device. This device delivers oxygen to your airway through a mask.  Surgery if other treatments do not work. During surgery, excess tissue is removed to create a wider airway. It is important to get treatment for sleep apnea. Without treatment, this condition can lead to:  High blood pressure.  Coronary artery disease.  (Men) An inability to achieve or maintain an erection (impotence).  Reduced thinking abilities. Follow these instructions at home:  Make any lifestyle changes that your health care provider recommends.  Eat a healthy, well-balanced diet.  Take over-the-counter and prescription medicines only as told by your health care provider.  Avoid using depressants, including alcohol, sedatives, and narcotics.  Take steps to lose weight if you are overweight.  If you were given a device to open your airway while you sleep, use it only as told by your health care provider.  Do not use any tobacco products, such as cigarettes, chewing tobacco, and e-cigarettes. If you need help quitting, ask your health care provider.  Keep all follow-up visits as told by your health care provider. This is important. Contact a health care provider if:  The device that you received to open your airway during sleep is uncomfortable or does not seem to be working.  Your symptoms do not improve.  Your symptoms get worse. Get help right away if:  You develop chest  pain.  You develop shortness of breath.  You develop discomfort in your back, arms, or stomach.  You have trouble speaking.  You have weakness on one side of your body.  You have drooping in your face. These symptoms may represent a serious problem that is an emergency. Do not wait to see if the symptoms will go away. Get medical help right away. Call your local emergency services (911 in the [place] Do not drive yourself to the hospital.  This information is not intended to replace advice given to you by your health care provider. Make sure you discuss any questions you have with your health care provider. Document Released: 07/24/2002 Document Revised: 03/29/2016 Document Reviewed: 05/13/2015 Elsevier Interactive Patient Education  2017 [employer].

## 2016-08-28 ENCOUNTER — Other Ambulatory Visit: Payer: Self-pay

## 2016-08-28 DIAGNOSIS — I10 Essential (primary) hypertension: Secondary | ICD-10-CM

## 2016-08-28 NOTE — Addendum Note (Signed)
Addended by: Narda Rutherford on: 08/28/2016 07:07 AM   Modules accepted: Orders

## 2016-08-29 DIAGNOSIS — E7800 Pure hypercholesterolemia, unspecified: Secondary | ICD-10-CM | POA: Insufficient documentation

## 2016-08-31 ENCOUNTER — Other Ambulatory Visit (INDEPENDENT_AMBULATORY_CARE_PROVIDER_SITE_OTHER): Payer: Commercial Managed Care - HMO

## 2016-08-31 DIAGNOSIS — F17200 Nicotine dependence, unspecified, uncomplicated: Secondary | ICD-10-CM

## 2016-08-31 DIAGNOSIS — E663 Overweight: Secondary | ICD-10-CM | POA: Diagnosis not present

## 2016-08-31 DIAGNOSIS — J3089 Other allergic rhinitis: Secondary | ICD-10-CM

## 2016-08-31 DIAGNOSIS — Z125 Encounter for screening for malignant neoplasm of prostate: Secondary | ICD-10-CM | POA: Diagnosis not present

## 2016-08-31 DIAGNOSIS — R5383 Other fatigue: Secondary | ICD-10-CM | POA: Diagnosis not present

## 2016-08-31 DIAGNOSIS — I10 Essential (primary) hypertension: Secondary | ICD-10-CM

## 2016-08-31 LAB — POCT GLYCOSYLATED HEMOGLOBIN (HGB A1C): Hemoglobin A1C: 5.4

## 2016-09-01 LAB — COMPREHENSIVE METABOLIC PANEL
A/G RATIO: 2 (ref 1.2–2.2)
ALT: 21 IU/L (ref 0–44)
AST: 20 IU/L (ref 0–40)
Albumin: 4.3 g/dL (ref 3.5–5.5)
Alkaline Phosphatase: 96 IU/L (ref 39–117)
BUN/Creatinine Ratio: 11 (ref 9–20)
BUN: 12 mg/dL (ref 6–24)
Bilirubin Total: 0.5 mg/dL (ref 0.0–1.2)
CALCIUM: 9.4 mg/dL (ref 8.7–10.2)
CO2: 25 mmol/L (ref 18–29)
CREATININE: 1.05 mg/dL (ref 0.76–1.27)
Chloride: 105 mmol/L (ref 96–106)
GFR calc non Af Amer: 82 mL/min/{1.73_m2} (ref >59)
GFR, EST AFRICAN AMERICAN: 95 mL/min/{1.73_m2} (ref >59)
Globulin, Total: 2.2 g/dL (ref 1.5–4.5)
Glucose: 85 mg/dL (ref 65–99)
POTASSIUM: 5.7 mmol/L — AB (ref 3.5–5.2)
Sodium: 144 mmol/L (ref 134–144)
TOTAL PROTEIN: 6.5 g/dL (ref 6.0–8.5)

## 2016-09-01 LAB — LIPID PANEL
CHOLESTEROL TOTAL: 206 mg/dL — AB (ref 100–199)
Chol/HDL Ratio: 5.9 ratio units — ABNORMAL HIGH (ref 0.0–5.0)
HDL: 35 mg/dL — ABNORMAL LOW (ref >39)
LDL CALC: 153 mg/dL — AB (ref 0–99)
Triglycerides: 88 mg/dL (ref 0–149)
VLDL CHOLESTEROL CAL: 18 mg/dL (ref 5–40)

## 2016-09-01 LAB — CBC WITH DIFFERENTIAL/PLATELET
BASOS: 0 %
Basophils Absolute: 0 10*3/uL (ref 0.0–0.2)
EOS (ABSOLUTE): 0.1 10*3/uL (ref 0.0–0.4)
Eos: 2 %
HEMOGLOBIN: 16.7 g/dL (ref 13.0–17.7)
Hematocrit: 48.3 % (ref 37.5–51.0)
IMMATURE GRANS (ABS): 0 10*3/uL (ref 0.0–0.1)
Immature Granulocytes: 0 %
Lymphocytes Absolute: 1.3 10*3/uL (ref 0.7–3.1)
Lymphs: 26 %
MCH: 31.2 pg (ref 26.6–33.0)
MCHC: 34.6 g/dL (ref 31.5–35.7)
MCV: 90 fL (ref 79–97)
MONOCYTES: 6 %
Monocytes Absolute: 0.3 10*3/uL (ref 0.1–0.9)
NEUTROS ABS: 3.3 10*3/uL (ref 1.4–7.0)
Neutrophils: 66 %
Platelets: 174 10*3/uL (ref 150–379)
RBC: 5.35 x10E6/uL (ref 4.14–5.80)
RDW: 13.2 % (ref 12.3–15.4)
WBC: 5.1 10*3/uL (ref 3.4–10.8)

## 2016-09-01 LAB — HIV ANTIBODY (ROUTINE TESTING W REFLEX): HIV SCREEN 4TH GENERATION: NONREACTIVE

## 2016-09-01 LAB — HEPATITIS C ANTIBODY: Hep C Virus Ab: <0.1 s/co ratio (ref 0.0–0.9)

## 2016-09-01 LAB — T4, FREE: FREE T4: 1.36 ng/dL (ref 0.82–1.77)

## 2016-09-01 LAB — PSA: Prostate Specific Ag, Serum: 1.1 ng/mL (ref 0.0–4.0)

## 2016-09-01 LAB — TSH: TSH: 1.48 u[IU]/mL (ref 0.450–4.500)

## 2016-09-01 LAB — VITAMIN D 25 HYDROXY (VIT D DEFICIENCY, FRACTURES): Vit D, 25-Hydroxy: 20.4 ng/mL — ABNORMAL LOW (ref 30.0–100.0)

## 2016-09-05 DIAGNOSIS — Z716 Tobacco abuse counseling: Secondary | ICD-10-CM | POA: Insufficient documentation

## 2016-09-05 NOTE — Assessment & Plan Note (Addendum)
D/c pt about possible OSA condition, insomnia etc.    Pt declines Sleep Study - but will think about it.  - Obtain CBC, TSH, A1C, CMP, Vit D, HIV, Hep C  - d/c pt healthier lifestyle habits to help with energy levels.

## 2016-09-07 ENCOUNTER — Telehealth: Payer: Self-pay | Admitting: Family Medicine

## 2016-09-07 NOTE — Telephone Encounter (Signed)
Patient is requesting a refill of his losartan.

## 2016-09-08 ENCOUNTER — Other Ambulatory Visit: Payer: Self-pay

## 2016-09-08 ENCOUNTER — Telehealth: Payer: Self-pay | Admitting: Family Medicine

## 2016-09-08 DIAGNOSIS — E559 Vitamin D deficiency, unspecified: Secondary | ICD-10-CM | POA: Insufficient documentation

## 2016-09-08 MED ORDER — VITAMIN D3 125 MCG (5000 UT) PO TABS: 1.0000 | ORAL_TABLET | Freq: Every day | ORAL | 0 refills | Status: DC

## 2016-09-08 MED ORDER — ATORVASTATIN CALCIUM 40 MG PO TABS: ORAL_TABLET | ORAL | Status: DC

## 2016-09-08 MED ORDER — VITAMIN D (ERGOCALCIFEROL) 1.25 MG (50000 UNIT) PO CAPS: 50000.0000 [IU] | ORAL_CAPSULE | ORAL | 1 refills | Status: DC

## 2016-09-08 MED ORDER — LOSARTAN POTASSIUM 50 MG PO TABS: 50.0000 mg | ORAL_TABLET | Freq: Every day | ORAL | 0 refills | Status: DC

## 2016-09-08 NOTE — Telephone Encounter (Signed)
Pt's wife called states he wanted her to be told lab result- request Tonya call her(Tammy)-- Thanks Baker Janus

## 2016-09-08 NOTE — Telephone Encounter (Signed)
Completed.  Charyl Bigger, CMA

## 2016-09-08 NOTE — Progress Notes (Signed)
Per result note from Dr. Raliegh Scarlet, RXs for Vitamin D 50000iu and atorvastatin sent to pharmacy.  Pt also requested refill on losartan.    Charyl Bigger, CMA

## 2016-09-08 NOTE — Telephone Encounter (Signed)
Verified pt had signed release on file for HIPPA to be communicated to his spouse, Tammy.  Results and recommendations communicated to spouse.  Spouse expressed understanding.  Low saturated/ transfat diet information placed at front desk for pt's spouse to pick up.  Charyl Bigger, CMA

## 2016-09-09 ENCOUNTER — Other Ambulatory Visit: Payer: Self-pay

## 2016-09-09 ENCOUNTER — Encounter (INDEPENDENT_AMBULATORY_CARE_PROVIDER_SITE_OTHER): Payer: Self-pay

## 2016-09-09 ENCOUNTER — Telehealth: Payer: Self-pay | Admitting: Family Medicine

## 2016-09-09 MED ORDER — ATORVASTATIN CALCIUM 40 MG PO TABS: ORAL_TABLET | ORAL | Status: DC

## 2016-09-09 NOTE — Telephone Encounter (Signed)
Pt's spouse states that pharmacy did receive RX for atorvastatin.  RX printed for pt or spouse to take to pharmacy.  Charyl Bigger, CMA

## 2016-09-09 NOTE — Telephone Encounter (Signed)
Pt's wife called states Pharmacy did not have Rx for Cholesterol medication and they couldn't get.Marland Kitchen Pls call in today if possible-- Thanks Baker Janus

## 2016-10-09 ENCOUNTER — Other Ambulatory Visit (INDEPENDENT_AMBULATORY_CARE_PROVIDER_SITE_OTHER): Payer: Commercial Managed Care - HMO

## 2016-10-09 DIAGNOSIS — Z79899 Other long term (current) drug therapy: Secondary | ICD-10-CM | POA: Diagnosis not present

## 2016-10-10 LAB — HEPATIC FUNCTION PANEL
ALK PHOS: 104 IU/L (ref 39–117)
ALT: 26 IU/L (ref 0–44)
AST: 15 IU/L (ref 0–40)
Albumin: 4.4 g/dL (ref 3.5–5.5)
Bilirubin Total: 0.3 mg/dL (ref 0.0–1.2)
Bilirubin, Direct: 0.12 mg/dL (ref 0.00–0.40)
Total Protein: 6.9 g/dL (ref 6.0–8.5)

## 2016-10-30 ENCOUNTER — Ambulatory Visit: Payer: Self-pay | Admitting: Family Medicine

## 2016-11-20 DIAGNOSIS — H66002 Acute suppurative otitis media without spontaneous rupture of ear drum, left ear: Secondary | ICD-10-CM | POA: Diagnosis not present

## 2016-12-17 ENCOUNTER — Ambulatory Visit: Payer: Self-pay | Admitting: Family Medicine

## 2017-01-14 ENCOUNTER — Ambulatory Visit: Payer: Self-pay | Admitting: Family Medicine

## 2017-01-26 ENCOUNTER — Encounter: Payer: Self-pay | Admitting: Family Medicine

## 2017-01-26 ENCOUNTER — Ambulatory Visit (INDEPENDENT_AMBULATORY_CARE_PROVIDER_SITE_OTHER): Payer: Commercial Managed Care - HMO | Admitting: Family Medicine

## 2017-01-26 VITALS — BP 123/84 | HR 72 | Ht 72.0 in | Wt 220.9 lb

## 2017-01-26 DIAGNOSIS — I10 Essential (primary) hypertension: Secondary | ICD-10-CM

## 2017-01-26 DIAGNOSIS — E786 Lipoprotein deficiency: Secondary | ICD-10-CM | POA: Diagnosis not present

## 2017-01-26 DIAGNOSIS — E7800 Pure hypercholesterolemia, unspecified: Secondary | ICD-10-CM

## 2017-01-26 DIAGNOSIS — E559 Vitamin D deficiency, unspecified: Secondary | ICD-10-CM | POA: Diagnosis not present

## 2017-01-26 DIAGNOSIS — F17200 Nicotine dependence, unspecified, uncomplicated: Secondary | ICD-10-CM

## 2017-01-26 DIAGNOSIS — E663 Overweight: Secondary | ICD-10-CM | POA: Diagnosis not present

## 2017-01-26 DIAGNOSIS — Z716 Tobacco abuse counseling: Secondary | ICD-10-CM | POA: Diagnosis not present

## 2017-01-26 MED ORDER — LOSARTAN POTASSIUM 50 MG PO TABS: 50.0000 mg | ORAL_TABLET | Freq: Every day | ORAL | 1 refills | Status: DC

## 2017-01-26 MED ORDER — ATORVASTATIN CALCIUM 40 MG PO TABS: ORAL_TABLET | ORAL | 1 refills | Status: DC

## 2017-01-26 NOTE — Patient Instructions (Signed)
Health Risks of Smoking Smoking cigarettes is very bad for your health. Tobacco smoke has over 200 known poisons in it. It contains the poisonous gases nitrogen oxide and carbon monoxide. There are over 60 chemicals in tobacco smoke that cause cancer. Smoking is difficult to quit because a chemical in tobacco, called nicotine, causes addiction or dependence. When you smoke and inhale, nicotine is absorbed rapidly into the bloodstream through your lungs. Both inhaled and non-inhaled nicotine may be addictive. What are the risks of cigarette smoke? Cigarette smokers have an increased risk of many serious medical problems, including:  Lung cancer.  Lung disease, such as pneumonia, bronchitis, and emphysema.  Chest pain (angina) and heart attack because the heart is not getting enough oxygen.  Heart disease and peripheral blood vessel disease.  High blood pressure (hypertension).  Stroke.  Oral cancer, including cancer of the lip, mouth, or voice box.  Bladder cancer.  Pancreatic cancer.  Cervical cancer.  Pregnancy complications, including premature birth.  Stillbirths and smaller newborn babies, birth defects, and genetic damage to sperm.  Early menopause.  Lower estrogen level for women.  Infertility.  Facial wrinkles.  Blindness.  Increased risk of broken bones (fractures).  Senile dementia.  Stomach ulcers and internal bleeding.  Delayed wound healing and increased risk of complications during surgery.  Even smoking lightly shortens your life expectancy by several years.  Because of secondhand smoke exposure, children of smokers have an increased risk of the following:  Sudden infant death syndrome (SIDS).  Respiratory infections.  Lung cancer.  Heart disease.  Ear infections.  What are the benefits of quitting? There are many health benefits of quitting smoking. Here are some of them:  Within days of quitting smoking, your risk of having a heart  attack decreases, your blood flow improves, and your lung capacity improves. Blood pressure, pulse rate, and breathing patterns start returning to normal soon after quitting.  Within months, your lungs may clear up completely.  Quitting for 10 years reduces your risk of developing lung cancer and heart disease to almost that of a nonsmoker.  People who quit may see an improvement in their overall quality of life.  How do I quit smoking? Smoking is an addiction with both physical and psychological effects, and longtime habits can be hard to change. Your health care provider can recommend:  Programs and community resources, which may include group support, education, or talk therapy.  Prescription medicines to help reduce cravings.  Nicotine replacement products, such as patches, gum, and nasal sprays. Use these products only as directed. Do not replace cigarette smoking with electronic cigarettes, which are commonly called e-cigarettes. The safety of e-cigarettes is not known, and some may contain harmful chemicals.  A combination of two or more of these methods.  Where to find more information:  American Lung Association: www.lung.org  American Cancer Society: www.cancer.org Summary  Smoking cigarettes is very bad for your health. Cigarette smokers have an increased risk of many serious medical problems, including several cancers, heart disease, and stroke.  Smoking is an addiction with both physical and psychological effects, and longtime habits can be hard to change.  By stopping right away, you can greatly reduce the risk of medical problems for you and your family.  To help you quit smoking, your health care provider can recommend programs, community resources, prescription medicines, and nicotine replacement products such as patches, gum, and nasal sprays. This information is not intended to replace advice given to you by your  health care provider. Make sure you discuss any  questions you have with your health care provider. Document Released: 09/10/2004 Document Revised: 08/07/2016 Document Reviewed: 08/07/2016 Elsevier Interactive Patient Education  2017 [employer].   Tobacco Use Disorder Tobacco use disorder (TUD) is a mental disorder. It is the long-term use of tobacco in spite of related health problems or difficulty with normal life activities. Tobacco is most commonly smoked as cigarettes and less commonly as cigars or pipes. Smokeless chewing tobacco and snuff are also popular. People with TUD get a feeling of extreme pleasure (euphoria) from using tobacco and have a desire to use it again and again. Repeated use of tobacco can cause problems. The addictive effects of tobacco are due mainly tothe ingredient nicotine. Nicotine also causes a rush of adrenaline (epinephrine) in the body. This leads to increased blood pressure, heart rate, and breathing rate. These changes may cause problems for people with high blood pressure, weak hearts, or lung disease. High doses of nicotine in children and pets can lead to seizures and death. Tobacco contains a number of other unsafe chemicals. These chemicals are especially harmful when inhaled as smoke and can damage almost every organ in the body. Smokers live shorter lives than nonsmokers and are at risk of dying from a number of diseases and cancers. Tobacco smoke can also cause health problems for nonsmokers (due to inhaling secondhand smoke). Smoking is also a fire hazard. TUD usually starts in the late teenage years and is most common in young adults between the ages of 9 and 33 years. People who start smoking earlier in life are more likely to continue smoking as adults. TUD is somewhat more common in men than women. People with TUD are at higher risk for using alcohol and other drugs of abuse. What increases the risk? Risk factors for TUD include:  Having family members with the disorder.  Being around people who  use tobacco.  Having an existing mental health issue such as schizophrenia, depression, bipolar disorder, ADHD, or posttraumatic stress disorder (PTSD).  What are the signs or symptoms? People with tobacco use disorder have two or more of the following signs and symptoms within 12 months:  Use of more tobacco over a longer period than intended.  Not able to cut down or control tobacco use.  A lot of time spent obtaining or using tobacco.  Strong desire or urge to use tobacco (craving). Cravings may last for 6 months or longer after quitting.  Use of tobacco even when use leads to major problems at work, school, or home.  Use of tobacco even when use leads to relationship problems.  Giving up or cutting down on important life activities because of tobacco use.  Repeatedly using tobacco in situations where it puts you or others in physical danger, like smoking in bed.  Use of tobacco even when it is known that a physical or mental problem is likely related to tobacco use. ? Physical problems are numerous and may include chronic bronchitis, emphysema, lung and other cancers, gum disease, high blood pressure, heart disease, and stroke. ? Mental problems caused by tobacco may include difficulty sleeping and anxiety.  Need to use greater amounts of tobacco to get the same effect. This means you have developed a tolerance.  Withdrawal symptoms as a result of stopping or rapidly cutting back use. These symptoms may last a month or more after quitting and include the following: ? Depressed, anxious, or irritable mood. ? Difficulty concentrating. ?  Increased appetite. ? Restlessness or trouble sleeping. ? Use of tobacco to avoid withdrawal symptoms.  How is this diagnosed? Tobacco use disorder is diagnosed by your health care provider. A diagnosis may be made by:  Your health care provider asking questions about your tobacco use and any problems it may be causing.  A physical  exam.  Lab tests.  You may be referred to a mental health professional or addiction specialist.  The severity of tobacco use disorder depends on the number of signs and symptoms you have:  Mild-Two or three symptoms.  Moderate-Four or five symptoms.  Severe-Six or more symptoms.  How is this treated? Many people with tobacco use disorder are unable to quit on their own and need help. Treatment options include the following:  Nicotine replacement therapy (NRT). NRT provides nicotine without the other harmful chemicals in tobacco. NRT gradually lowers the dosage of nicotine in the body and reduces withdrawal symptoms. NRT is available in over-the-counter forms (gum, lozenges, and skin patches) as well as prescription forms (mouth inhaler and nasal spray).  Medicines.This may include: ? Antidepressant medicine that may reduce nicotine cravings. ? A medicine that acts on nicotine receptors in the brain to reduce cravings and withdrawal symptoms. It may also block the effects of tobacco in people with TUD who relapse.  Counseling or talk therapy. A form of talk therapy called behavioral therapy is commonly used to treat people with TUD. Behavioral therapy looks at triggers for tobacco use, how to avoid them, and how to cope with cravings. It is most effective in person or by phone but is also available in self-help forms (books and Internet websites).  Support groups. These provide emotional support, advice, and guidance for quitting tobacco.  The most effective treatment for TUD is usually a combination of medicine, talk therapy, and support groups. Follow these instructions at home:  Keep all follow-up visits as directed by your health care provider. This is important.  Take medicines only as directed by your health care provider.  Check with your health care provider before starting new prescription or over-the-counter medicines. Contact a health care provider if:  You are not  able to take your medicines as prescribed.  Treatment is not helping your TUD and your symptoms get worse. Get help right away if:  You have serious thoughts about hurting yourself or others.  You have trouble breathing, chest pain, sudden weakness, or sudden numbness in part of your body. This information is not intended to replace advice given to you by your health care provider. Make sure you discuss any questions you have with your health care provider. Document Released: 04/08/2004 Document Revised: 04/05/2016 Document Reviewed: 09/29/2013 Elsevier Interactive Patient Education  2018 [place] for a Low Cholesterol, Low Saturated Fat Diet   Fats - Limit total intake of fats and oils. - Avoid butter, stick margarine, shortening, lard, palm and coconut oils. - Limit mayonnaise, salad dressings, gravies and sauces, unless they are homemade with low-fat ingredients. - Limit chocolate. - Choose low-fat and nonfat products, such as low-fat mayonnaise, low-fat or non-hydrogenated peanut butter, low-fat or fat-free salad dressings and nonfat gravy. - Use vegetable oil, such as canola or olive oil. - Look for margarine that does not contain trans fatty acids. - Use nuts in moderate amounts. - Read ingredient labels carefully to determine both amount and type of fat present in foods. Limit saturated and trans fats! - Avoid high-fat processed and convenience  foods.  Meats and Meat Alternatives - Choose fish, chicken, [place] and lean meats. - Use dried beans, peas, lentils and tofu. - Limit egg yolks to three to four per week. - If you eat red meat, limit to no more than three servings per week and choose loin or round cuts. - Avoid fatty meats, such as bacon, sausage, franks, luncheon meats and ribs. - Avoid all organ meats, including liver.  Dairy - Choose nonfat or low-fat milk, yogurt and cottage cheese. - Most cheeses are high in fat. Choose cheeses made  from non-fat milk, such as mozzarella and ricotta cheese. - Choose light or fat-free cream cheese and sour cream. - Avoid cream and sauces made with cream.  Fruits and Vegetables - Eat a wide variety of fruits and vegetables. - Use lemon juice, vinegar or "mist" olive oil on vegetables. - Avoid adding sauces, fat or oil to vegetables.  Breads, Cereals and Grains - Choose whole-grain breads, cereals, pastas and rice. - Avoid high-fat snack foods, such as granola, cookies, pies, pastries, doughnuts and croissants.  Cooking Tips - Avoid deep fried foods. - Trim visible fat off meats and remove skin from poultry before cooking. - Bake, broil, boil, poach or roast poultry, fish and lean meats. - Drain and discard fat that drains out of meat as you cook it. - Add little or no fat to foods. - Use vegetable oil sprays to grease pans for cooking or baking. - Steam vegetables. - Use herbs or no-oil marinades to flavor foods.

## 2017-01-26 NOTE — Progress Notes (Signed)
Impression and Recommendations:    1. Essential hypertension   2. Elevated LDL cholesterol level   3. Low HDL (under 40)   4. Tobacco use disorder   5. Overweight (BMI 25.0-29.9)   6. Tobacco abuse counseling   7. Vitamin D deficiency     Elevated LDL cholesterol level 13.1% 10 yr risk  Pt agrees to take statin regularly  - handouts on diet changes given to pt  HTN (hypertension) Lifestyle changes such as dash diet and engaging in a regular exercise program discussed with patient.   Educational handouts provided  Ambulatory BP monitoring encouraged. Keep log and bring in next OV  Continue current medication(s)- BP overall pretty well controlled.    RF meds  h/o Tobacco use disorder Pt wants to quit cold [place] - doesn't think he needs med's after our discussion   didn't want to make a quit date  Counseling done    Low HDL (under 40) rec 30 min daily aerobic activity   Overweight (BMI 25.0-29.9) D/c pt about wt loss and BMI   Tobacco abuse counseling 5 min or more spent  Vitamin D deficiency cont supp    Education and routine counseling performed. Handouts provided.    Return in about 4 months (around 05/28/2017).  The patient was counseled, risk factors were discussed, anticipatory guidance given.  Gross side effects, risk and benefits, and alternatives of medications discussed with patient.  Patient is aware that all medications have potential side effects and we are unable to predict every side effect or drug-drug interaction that may occur.  Expresses verbal understanding and consents to current therapy plan and treatment regimen.  Please see AVS handed out to patient at the end of our visit for further patient instructions/ counseling done pertaining to today's office visit.    Note: This document was prepared using Dragon voice recognition software and may include unintentional dictation errors.     Subjective:    Chief Complaint    Patient presents with  . Hypertension    follow up     HPI: Brian Gay is a 51 y.o. male who presents to [place] [facility] today for follow up for HTN.     Problem  Low Hdl (Under 40)  Elevated Ldl Cholesterol Level  h/o Tobacco use disorder  Htn (Hypertension)  Tobacco Abuse Counseling  Overweight (Bmi 25.0-29.9)  Vitamin D Deficiency     CHOL HPI:   -  He  is currently managed with:  See med list from today  Txmnt compliance- NOT---> pt  Had started the Lipitor and came in for repeat blodwork 6 weeks later LFTs were normal.  But rght afterhat blood work in February, he stopped the medicine.     Still smoking.  NO desire to quit  Patient reports very little compliance with low chol/ saturated and trans fat diet.  No exercise  RUQ pain- n    Muscle aches- n  No other s-e  Last lipid panel as follows:  Lab Results  Component Value Date   CHOL 206 (H) 08/31/2016   HDL 35 (L) 08/31/2016   LDLCALC 153 (H) 08/31/2016   TRIG 88 08/31/2016   CHOLHDL 5.9 (H) 08/31/2016    Hepatic Function Latest Ref Rng & Units 10/09/2016 08/31/2016 12/12/2015  Total Protein 6.0 - 8.5 g/dL 6.9 6.5 6.9  Albumin 3.5 - 5.5 g/dL 4.4 4.3 4.1  AST 0 - 40 IU/L _0 ALT  0 - 44 IU/L _0 Alk Phosphatase 39 - 117 IU/L 104 96 76  Total Bilirubin 0.0 - 1.2 mg/dL 0.3 0.5 0.6  Bilirubin, Direct 0.00 - 0.40 mg/dL 0.12 - -       HTN:  -  His blood pressure has not been controlled at home- not checking!   - Patient reports good compliance with blood pressure medications  - Denies medication S-E   - Smoking Status noted- counseled   - He denies new onset of: chest pain, exercise intolerance, shortness of breath, dizziness, visual changes, headache, lower extremity swelling or claudication.   Today their BP is BP: 123/84   Last 3 blood pressure readings in our office are as follows: BP Readings from Last 3 Encounters:  06/02/17 (!) 148/92  01/26/17  123/84  08/04/16 113/73    Pulse Readings from Last 3 Encounters:  06/02/17 76  01/26/17 72  08/04/16 97    Filed Weights   01/26/17 1601  Weight: 220 lb 14.4 oz (100.2 kg)      Patient Care Team    Relationship Specialty Notifications Start End  [name], DO PCP - General Family Medicine  08/04/16   Coolidge Breeze, MD Consulting Physician Dentistry  12/11/15   Daryll Brod, MD Consulting Physician Orthopedic Surgery  01/26/17      Lab Results  Component Value Date   CREATININE 1.05 08/31/2016   BUN 12 08/31/2016   NA 144 08/31/2016   K 5.7 (H) 08/31/2016   CL 105 08/31/2016   CO2 25 08/31/2016    Lab Results  Component Value Date   CHOL 206 (H) 08/31/2016   CHOL 202 (H) 12/12/2015    Lab Results  Component Value Date   HDL 35 (L) 08/31/2016   HDL 38.70 (L) 12/12/2015    Lab Results  Component Value Date   LDLCALC 153 (H) 08/31/2016   [place] 150 (H) 12/12/2015    Lab Results  Component Value Date   TRIG 88 08/31/2016   TRIG 70.0 12/12/2015    Lab Results  Component Value Date   CHOLHDL 5.9 (H) 08/31/2016   CHOLHDL 5 12/12/2015    No results found for: LDLDIRECT ===================================================================   Patient Active Problem List   Diagnosis Date Noted  . Low HDL (under 40) 01/26/2017    Priority: High  . Elevated LDL cholesterol level 08/29/2016    Priority: High  . h/o Tobacco use disorder 08/04/2016    Priority: High  . HTN (hypertension) 10/10/2015    Priority: High  . Tobacco abuse counseling 09/05/2016    Priority: Medium  . Overweight (BMI 25.0-29.9) 08/04/2016    Priority: Medium  . Environmental and seasonal allergies 08/04/2016    Priority: Medium  . Vitamin D deficiency 09/08/2016    Priority: Low  . Sinusitis 06/02/2017  . Stress and adjustment reaction 06/02/2017  . Fatigue- esp AM 08/04/2016  . Closed displaced fracture of middle phalanx of right ring finger 03/16/2016  . [place]  plate injury of finger 03/16/2016  . Wellness examination 12/12/2015  . Allergic rhinitis 11/02/2012     Past Medical History:  Diagnosis Date  . Allergy   . Hypertension   . Kidney stones      Past Surgical History:  Procedure Laterality Date  . CLOSED REDUCTION FINGER WITH PERCUTANEOUS PINNING Right 03/19/2016   Procedure: CLOSED REDUCTION right ring  FINGER;  Surgeon: Daryll Brod, MD;  Location: [place];  Service: Orthopedics;  Laterality:  Right;  . cyst removed      pilonidal cyst  . WISDOM TOOTH EXTRACTION       Family History  Problem Relation Age of Onset  . Heart disease Mother   . Hypertension Mother   . Heart disease Father   . Cancer Father        prostate  . Prostate cancer Father   . Hyperlipidemia Father   . Stroke Father   . Heart disease Maternal Grandmother   . Heart disease Maternal Grandfather   . Heart disease Paternal Grandmother   . Heart disease Paternal Grandfather   . Colon cancer Neg Hx   . Colon polyps Neg Hx   . Rectal cancer Neg Hx   . Stomach cancer Neg Hx      Social History   Substance and Sexual Activity  Drug Use No  ,  Social History   Substance and Sexual Activity  Alcohol Use Yes  . Alcohol/week: 0.0 oz   Comment: occasional  ,  Social History   Tobacco Use  Smoking Status Former Smoker  . Packs/day: 0.50  . Years: 20.00  . Pack years: 10.00  . Last attempt to quit: 07/2016  . Years since quitting: 0.9  Smokeless Tobacco Current User  . Types: Chew  ,    No current outpatient medications on file prior to visit.   No current facility-administered medications on file prior to visit.      Allergies  Allergen Reactions  . Codeine Swelling     Review of Systems:   General:  Denies fever, chills Optho/Auditory:   Denies visual changes, blurred vision Respiratory:   Denies SOB, cough, wheeze, DIB  Cardiovascular:   Denies chest pain, palpitations, painful respirations Gastrointestinal:    Denies nausea, vomiting, diarrhea.  Endocrine:     Denies new hot or cold intolerance Musculoskeletal:  Denies joint swelling, gait issues, or new unexplained myalgias/ arthralgias Skin:  Denies rash, suspicious lesions  Neurological:    Denies dizziness, unexplained weakness, numbness  Psychiatric/Behavioral:   Denies mood changes  Objective:    Blood pressure 123/84, pulse 72, height 6' (1.829 m), weight 220 lb 14.4 oz (100.2 kg).  Body mass index is 29.96 kg/m.  General: Well Developed, well nourished, and in no acute distress.  HEENT: Normocephalic, atraumatic, pupils equal round reactive to light, neck supple, No carotid bruits, no JVD Skin: Warm and dry, cap RF less 2 sec Cardiac: Regular rate and rhythm, S1, S2 WNL's, no murmurs rubs or gallops Respiratory: ECTA B/L, Not using accessory muscles, speaking in full sentences. NeuroM-Sk: Ambulates w/o assistance, moves ext * 4 w/o difficulty, sensation grossly intact.  Ext: scant edema b/l lower ext Psych: No HI/SI, judgement and insight good, Euthymic mood. Full Affect.

## 2017-01-26 NOTE — Assessment & Plan Note (Signed)
D/c pt about wt loss and BMI

## 2017-01-26 NOTE — Assessment & Plan Note (Signed)
5 min or more spent

## 2017-01-26 NOTE — Assessment & Plan Note (Signed)
Pt wants to quit cold [place] - doesn't think he needs med's after our discussion   didn't want to make a quit date  Counseling done

## 2017-01-26 NOTE — Assessment & Plan Note (Signed)
rec 30 min daily aerobic activity

## 2017-01-26 NOTE — Assessment & Plan Note (Signed)
Lifestyle changes such as dash diet and engaging in a regular exercise program discussed with patient.   Educational handouts provided  Ambulatory BP monitoring encouraged. Keep log and bring in next OV  Continue current medication(s)- BP overall pretty well controlled.    RF meds

## 2017-01-26 NOTE — Assessment & Plan Note (Addendum)
13.1% 10 yr risk  Pt agrees to take statin regularly  - handouts on diet changes given to pt

## 2017-01-26 NOTE — Assessment & Plan Note (Signed)
cont supp

## 2017-05-28 ENCOUNTER — Ambulatory Visit: Payer: Self-pay | Admitting: Family Medicine

## 2017-06-02 ENCOUNTER — Encounter: Payer: Self-pay | Admitting: Family Medicine

## 2017-06-02 ENCOUNTER — Telehealth: Payer: Self-pay | Admitting: Family Medicine

## 2017-06-02 ENCOUNTER — Ambulatory Visit (INDEPENDENT_AMBULATORY_CARE_PROVIDER_SITE_OTHER): Payer: 59 | Admitting: Family Medicine

## 2017-06-02 VITALS — BP 148/92 | HR 76 | Ht 72.0 in | Wt 220.8 lb

## 2017-06-02 DIAGNOSIS — J3089 Other allergic rhinitis: Secondary | ICD-10-CM | POA: Diagnosis not present

## 2017-06-02 DIAGNOSIS — I10 Essential (primary) hypertension: Secondary | ICD-10-CM | POA: Diagnosis not present

## 2017-06-02 DIAGNOSIS — F4329 Adjustment disorder with other symptoms: Secondary | ICD-10-CM | POA: Insufficient documentation

## 2017-06-02 DIAGNOSIS — J329 Chronic sinusitis, unspecified: Secondary | ICD-10-CM | POA: Diagnosis not present

## 2017-06-02 DIAGNOSIS — E7800 Pure hypercholesterolemia, unspecified: Secondary | ICD-10-CM

## 2017-06-02 MED ORDER — FLUTICASONE PROPIONATE 50 MCG/ACT NA SUSP: 1.0000 | Freq: Two times a day (BID) | NASAL | 6 refills | Status: DC

## 2017-06-02 MED ORDER — FLUOXETINE HCL 20 MG PO TABS: 20.0000 mg | ORAL_TABLET | Freq: Every day | ORAL | 3 refills | Status: DC

## 2017-06-02 MED ORDER — SINUS RINSE BOTTLE KIT NA PACK: PACK | NASAL | Status: DC

## 2017-06-02 MED ORDER — LOSARTAN POTASSIUM-HCTZ 100-12.5 MG PO TABS: 1.0000 | ORAL_TABLET | Freq: Every day | ORAL | 3 refills | Status: DC

## 2017-06-02 MED ORDER — CETIRIZINE HCL 10 MG PO CAPS: ORAL_CAPSULE | ORAL | Status: DC

## 2017-06-02 MED ORDER — PREDNISONE 20 MG PO TABS: ORAL_TABLET | ORAL | 0 refills | Status: DC

## 2017-06-02 NOTE — Progress Notes (Signed)
Impression and Recommendations:    1. Elevated LDL cholesterol level   2. Essential hypertension   3. Environmental and seasonal allergies   4. Sinusitis, unspecified chronicity, unspecified location   5. Stress and adjustment reaction     Specific instructions given to pt in AVS:  "Please follow up in about 6 weeks to see how you're doing on your new blood pressure medicines.  Please bring in a blood pressure log of what your blood pressures are running at home.  We will also recheck to see how your sinuses are.    Additionally make an appointment in mid to early January for a complete yearly physical.,  Fasting so we can obtain fasting blood work on Geneticist, molecular you're starting the new blood pressure medicine cut in half and take half a tablet daily for 2 weeks.  Please check your blood pressure and see what it's running.  If you are at goal of less than 130 on the top and less than 80 on the bottom on a regular basis then do not go to the one full tablet!!!    If you are not at goal of less than 130/80 on a regular basis then take the full tablet daily  Please do the Nettie pot or Ayr sinus rinses twice daily followed by one spray Flonase each nostril.  Then take an Allegra or Zyrtec or Claritin daily.  Please continue this regiment until the allergy season is completely over around early December    For the acute flareup and problems with your sinuses right now: prednisone taper,  And let me know if you develop fevers and chills along with one-sided face pain or pain in one area of your sinuses because if this develops you may need antibiotics.  Call and let us know if this is the case.  We will call in Augmentin for 10 days for you"    1) - cont lipitor q hs; reck levels in couple months  2) - d/c losartan and start Losartan- HCTZ today; check BP at home; counseling done  3-4) - prednisone for sinuses, pt aware may transiently inc BP.  Supportive care d/c pt.   Take seasonal all  meds and do netti pot regularly  5) -->  We decided on fluoxetine after discussion of possible decreased sexual desire, unlikely to have ED but we discussed all the positive effects of the medicine.  Patient opted to take this medicine.    --> He will follow-up in 4-6 weeks on this new medicine as well as his new blood pressure medicine.  Meds ordered this encounter  Medications  . losartan-hydrochlorothiazide (HYZAAR) 100-12.5 MG tablet    Sig: Take 1 tablet by mouth daily.    Dispense:  90 tablet    Refill:  3  . predniSONE (DELTASONE) 20 MG tablet    Sig: Take 3 tabs po * 2 days, then 2 tabs for 2 d, then 1 tab 2 d, then 1/2 tab 2 days.    Dispense:  15 tablet    Refill:  0  . fluticasone (FLONASE) 50 MCG/ACT nasal spray    Sig: Place 1 spray into both nostrils 2 (two) times daily.    Dispense:  16 g    Refill:  6  . Cetirizine HCl (ZYRTEC ALLERGY) 10 MG CAPS    Sig: Daly take Allegra or Claritin or Zyrtec or Xyzal during the allergy season daily    Dispense:  30 capsule  .  Hypertonic Nasal Wash (SINUS RINSE BOTTLE KIT) PACK    Sig: Please do sinus rinses of your nasal passages twice daily followed by one spray Flonase each nostril.  Also, feel free to do a sinus rinse after any prolonged period of time outdoors  . FLUoxetine (PROZAC) 20 MG tablet    Sig: Take 1 tablet (20 mg total) by mouth daily.    Dispense:  90 tablet    Refill:  3     Education and routine counseling performed. Handouts provided.   Return for 6 weeks follow-up blood pressure, then noted January complete physical come fasting.  The patient was counseled, risk factors were discussed, anticipatory guidance given.  Gross side effects, risk and benefits, and alternatives of medications discussed with patient.  Patient is aware that all medications have potential side effects and we are unable to predict every side effect or drug-drug interaction that may occur.  Expresses verbal understanding and consents to  current therapy plan and treatment regimen.  Please see AVS handed out to patient at the end of our visit for further patient instructions/ counseling done pertaining to today's office visit.    Note: This document was prepared using Dragon voice recognition software and may include unintentional dictation errors.     Subjective:    Chief Complaint  Patient presents with  . Follow-up  . Sinus Problem    sinus congestion and pressure x 3-4 weeks with bilat ear pain    HPI: Brian Gay is a 51 y.o. male who presents to [facility] at [facility] today for follow up for HTN.    Patient is complains of sinus symptoms for approximately 2-3 weeks.  It has gotten worse over the past week or so.  He complains of headaches, bilateral ear pain.  It feels like "a Viviann Spare is driving out of his head in the morning" and when he wakes up he occasionally gets dizzy because of it.   Patient is taking an over-the-counter decongestant.  This is likely the reason why his blood pressure is up today.   he does not have any fever or chills or one-sided face pain.  He gets this every year at this time.    - Pt has has bad seasonal allergies in spring and fall and he has not been taking Flonase regularly and or Allegra or Zyrtec or Claritin over-the-counter.  Netti-pottting occ- once wkly if that.   --> he quit smoking since last OV and has gained some weight.    Not using any vapor products or nicotine etc.  He just did it cold [place].      CHOL:  He is taking meds regularly now since last OV.   No problems/ concerns   Mood:   As patient was leaving he was talking to me about the stress at his job in his life right now.  He has a lot of responsibilities at work and has a lot of people who must report to him.  He is asking about medicines to maybe take the edge off.  Denies SI/ HI, but feels irritable and uptight, like he wants to punch others in face. Wondering about meds. Concerns with S-E.     HTN: - Has been checking at work--130's/  90; no Sx   -  His blood pressure has not been controlled at home.   - Patient reports good compliance with blood pressure medications  - Denies medication S-E   - Smoking Status noted   -  He denies new onset of: chest pain, exercise intolerance, shortness of breath, dizziness, visual changes, headache, lower extremity swelling or claudication.   Today their BP is BP: (!) 148/92   Last 3 blood pressure readings in our office are as follows: BP Readings from Last 3 Encounters:  06/02/17 (!) 148/92  01/26/17 123/84  08/04/16 113/73    Pulse Readings from Last 3 Encounters:  06/02/17 76  01/26/17 72  08/04/16 97    Filed Weights   06/02/17 0846  Weight: 220 lb 12.8 oz (100.2 kg)      Patient Care Team    Relationship Specialty Notifications Start End  [name], DO PCP - General Family Medicine  08/04/16   Coolidge Breeze, MD Consulting Physician Dentistry  12/11/15   Daryll Brod, MD Consulting Physician Orthopedic Surgery  01/26/17      Lab Results  Component Value Date   CREATININE 1.05 08/31/2016   BUN 12 08/31/2016   NA 144 08/31/2016   K 5.7 (H) 08/31/2016   CL 105 08/31/2016   CO2 25 08/31/2016    Lab Results  Component Value Date   CHOL 206 (H) 08/31/2016   CHOL 202 (H) 12/12/2015    Lab Results  Component Value Date   HDL 35 (L) 08/31/2016   HDL 38.70 (L) 12/12/2015    Lab Results  Component Value Date   LDLCALC 153 (H) 08/31/2016   [place] 150 (H) 12/12/2015    Lab Results  Component Value Date   TRIG 88 08/31/2016   TRIG 70.0 12/12/2015    Lab Results  Component Value Date   CHOLHDL 5.9 (H) 08/31/2016   CHOLHDL 5 12/12/2015    No results found for: LDLDIRECT ===================================================================   Patient Active Problem List   Diagnosis Date Noted  . Low HDL (under 40) 01/26/2017    Priority: High  . Elevated LDL cholesterol level  08/29/2016    Priority: High  . h/o Tobacco use disorder 08/04/2016    Priority: High  . HTN (hypertension) 10/10/2015    Priority: High  . Tobacco abuse counseling 09/05/2016    Priority: Medium  . Overweight (BMI 25.0-29.9) 08/04/2016    Priority: Medium  . Environmental and seasonal allergies 08/04/2016    Priority: Medium  . Vitamin D deficiency 09/08/2016    Priority: Low  . Sinusitis 06/02/2017  . Stress and adjustment reaction 06/02/2017  . Fatigue- esp AM 08/04/2016  . Closed displaced fracture of middle phalanx of right ring finger 03/16/2016  . Volar plate injury of finger 03/16/2016  . Wellness examination 12/12/2015  . Allergic rhinitis 11/02/2012     Past Medical History:  Diagnosis Date  . Allergy   . Hypertension   . Kidney stones      Past Surgical History:  Procedure Laterality Date  . CLOSED REDUCTION FINGER WITH PERCUTANEOUS PINNING Right 03/19/2016   Procedure: CLOSED REDUCTION right ring  FINGER;  Surgeon: Daryll Brod, MD;  Location: [facility];  Service: Orthopedics;  Laterality: Right;  . cyst removed      pilonidal cyst  . WISDOM TOOTH EXTRACTION       Family History  Problem Relation Age of Onset  . Heart disease Mother   . Hypertension Mother   . Heart disease Father   . Cancer Father        prostate  . Prostate cancer Father   . Hyperlipidemia Father   . Stroke Father   . Heart disease Maternal Grandmother   .  Heart disease Maternal Grandfather   . Heart disease Paternal Grandmother   . Heart disease Paternal Grandfather   . Colon cancer Neg Hx   . Colon polyps Neg Hx   . Rectal cancer Neg Hx   . Stomach cancer Neg Hx      Social History   Substance and Sexual Activity  Drug Use No  ,  Social History   Substance and Sexual Activity  Alcohol Use Yes  . Alcohol/week: 0.0 oz   Comment: occasional  ,  Social History   Tobacco Use  Smoking Status Former Smoker  . Packs/day: 0.50  . Years: 20.00  . Pack  years: 10.00  . Last attempt to quit: 07/2016  . Years since quitting: 0.9  Smokeless Tobacco Current User  . Types: Chew  ,    Current Outpatient Medications on File Prior to Visit  Medication Sig Dispense Refill  . atorvastatin (LIPITOR) 40 MG tablet Take 1/2 tablet nightly at bedtime x 1 week then increase to 1 tablet nightly as tolerated 180 tablet 1   No current facility-administered medications on file prior to visit.      Allergies  Allergen Reactions  . Codeine Swelling     Review of Systems:   General:  Denies fever, chills Optho/Auditory:   Denies visual changes, blurred vision Respiratory:   Denies SOB, cough, wheeze, DIB  Cardiovascular:   Denies chest pain, palpitations, painful respirations Gastrointestinal:   Denies nausea, vomiting, diarrhea.  Endocrine:     Denies new hot or cold intolerance Musculoskeletal:  Denies joint swelling, gait issues, or new unexplained myalgias/ arthralgias Skin:  Denies rash, suspicious lesions  Neurological:    Denies dizziness, unexplained weakness, numbness  Psychiatric/Behavioral:   Denies mood changes  Objective:    Blood pressure (!) 148/92, pulse 76, height 6' (1.829 m), weight 220 lb 12.8 oz (100.2 kg).  Body mass index is 29.95 kg/m.  General: Well Developed, well nourished, and in no acute distress.  HEENT: Normocephalic, atraumatic, pupils equal round reactive to light, TM's- WNL's B/L but slight buldge; sinuses TTP globally; + ant cerv LAD;  neck supple, No carotid bruits, no JVD Skin: Warm and dry, cap RF less 2 sec Cardiac: Regular rate and rhythm, S1, S2 WNL's, no murmurs rubs or gallops Respiratory: ECTA B/L, Not using accessory muscles, speaking in full sentences. NeuroM-Sk: Ambulates w/o assistance, moves ext * 4 w/o difficulty, sensation grossly intact.  Ext: scant edema b/l lower ext Psych: No HI/SI, judgement and insight good, Euthymic mood. Full Affect.

## 2017-06-02 NOTE — Telephone Encounter (Signed)
Called spoke to [place], informed that it is okay to change to capsule. MPulliam, CMA/RT(R)

## 2017-06-02 NOTE — Telephone Encounter (Signed)
Stephanie/ [facility] states cost for Prozac tablets almost doubled pricing for Capsules and wishes Permission to change to (Capsules 10 or 20MG ) depending on PCP's dosage-- Pls call her at [phone] to authorize change-- Pt waiting at pharmacy! --glh

## 2017-06-02 NOTE — Patient Instructions (Addendum)
Please follow up in about 6 weeks to see how you're doing on your new blood pressure medicines.  Please bring in a blood pressure log of what your blood pressures are running at home.  We will also recheck to see how your sinuses are.   - Additionally make an appointment in mid to early January for a complete yearly physical.,  Fasting so we can obtain fasting blood work on your  hen you're starting the new blood pressure medicine cut in half and take half a tablet daily for 2 weeks.  Please check your blood pressure and see what it's running.  If you are at goal of less than 130 on the top and less than 80 on the bottom on a regular basis then do not go to the one full tablet!!!    If you are not at goal of less than 130/80 on a regular basis then take the full tablet daily  Please do the Nettie pot or Ayr sinus rinses twice daily followed by one spray Flonase each nostril.  Then take an Allegra or Zyrtec or Claritin daily.  Please continue this regiment until the allergy season is completely over around early December   - For the acute flareup and problems with your sinuses right now: prednisone taper,  And let me know if you develop fevers and chills along with one-sided face pain or pain in one area of your sinuses because if this develops you may need antibiotics.  Call and let us know if this is the case.  We will call in Augmentin for 10 days for you   How to Treat Vertigo at Home with Exercises  What is Vertigo?  Vertigo is a relatively common symptom most often associated with conditions such as sinusitis (inflammation of your sinuses due to viruses, allergies, or bacterial infections), or an inner ear infection or ear trauma.   It can be brought on by trauma (e.g. a blow to the head or whiplash) or more serious things like minor strokes.   Symptoms can also be brought on by normal degenerative changes to your inner ear that occur with aging.  The condition tends to be more commonly seen in  the elderly but it can occur in all ages.    Patients most often complain of dizziness, as if the room is spinning around them.   Symptoms are provoked by quick head movements or changes in position like going from standing to lying in bed, or even turning over in bed.   It may present with nausea and/or vomiting, and can be very debilitating to some folks.    By far the most common cause, known as Benign Paroxysmal Positional Vertigo (BPPV), is categorized by a sudden onset of symptoms, that are intense but short-lived (60 seconds or less), which is triggered by a change in head position.   Symptoms usually dissipate if you stay in one position and do not move your head.   Within the inner ear are collections of calcium carbonate crystals referred to as "otoliths" which may become dislodged from their normal position and migrate into the semicircular canals of the inner ear, throwing off your body's ability to sense where you are in space.     Fig. 921 Anatomy of the Right Osseous Labyrinth. Antonieta Iba. Anatomy of the Human Body. 1918.            What Else Could Be Behind My Vertigo?  Some other causes of vertigo include:  Meniere's disease (disorder of inner ear with ringing in ears, feeling of fullness/pressure within ear, and fluctuating hearing loss) Tumours Neurological disorders e.g. Multiple Sclerosis Motion Sickness (lack of coordination between visual stimuli, inner ear balance and positional sense) Migraine Labyrinthitis (inflammation of the fluid-filled tubes and sacs within the inner ear; may also be associated with changes in hearing) Vestibular neuritis (inflammation of the nerves associated with transmission of sensory info from the inner ear; usually of viral origins)  How it can be treated/cured? While certain medications have been prescribed for vertigo including Lorazepam your doing well 7 house the house going organizing and getting things ready for sale with the and  Meclizine (for motion sickness), there exists no evidence to support a recommendation of any medication in the routine treatment of BPPV.  Clinical trials have demonstrated that repositioning techniques (listed below) are a superior option for management Otis Dials et al., 2008).    Figure above:  (A) Instructions for the modified Epley procedure (MEP) for left ear posterior canal benign paroxysmal positional vertigo (PC-BPPV). For right ear BPPV, the procedure has to be performed in the opposite direction, starting with the head turned to the right side.  1. Start by sitting on a bed with your head turned 45 to the left. Place a pillow behind you so that on lying back it will be under your shoulders.  2. Lie back quickly with shoulders on the pillow, neck extended, and head resting on the bed. In this position, the affected (left) ear is underneath. Wait for 30 secondS.  3. Turn your head 90 to the right (without raising it), and wait again for 30 seconds.  4. Turn your body and head another 90 to the right, and wait for another 30 seconds.  5. Sit up on the right side. This maneuver should be performed three times a day. Repeat this daily until you are free from positional vertigo for 24 hours.   (B) Instructions for the modified Semont maneuver (MSM) for left ear PC-BPPV. For right ear BPPV, the maneuver has to be performed in the opposite direction, starting with the head turned toward the left ear.  1. Sit upright on a bed with your head turned 45 toward the right ear.  2. Drop quickly to the left side, so that your head touches the bed behind your left ear. Wait 30 seconds.  3. Move head and trunk in a swift movement toward the other side without stopping in the upright position, so that your head comes to rest on the right side of your forehead. Wait again for 30 seconds.  4. Sit up again.  This maneuver should be performed three times a day. Repeat this daily until you are free from positional  vertigo symptoms for 24 hours.   (   See the video in the supplementary material on the NeurologyWeb site; go to http://www.neurology.org/content/63/1/150/F1.expansion.html   )     You can also try this motion at home as well- Self-Treatment of Benign Paroxysmal Positional Vertigo Benign Paroxysmal Positioning Vertigo is caused by loose inner ear crystals in the inner ear that migrate while sleeping to the back-bottom inner ear balance canal, the so-called "posterior semi-circular canal." The maneuver demonstrated below is the way to reposition the loose crystals so that the symptoms caused by the loose crystals go away. You may have a floating, swaying sense while walking or sitting for a few days after this procedure.

## 2017-07-16 ENCOUNTER — Encounter: Payer: Self-pay | Admitting: Family Medicine

## 2017-07-16 ENCOUNTER — Ambulatory Visit (INDEPENDENT_AMBULATORY_CARE_PROVIDER_SITE_OTHER): Payer: 59 | Admitting: Family Medicine

## 2017-07-16 VITALS — BP 122/79 | HR 75 | Ht 72.0 in | Wt 217.7 lb

## 2017-07-16 DIAGNOSIS — F4329 Adjustment disorder with other symptoms: Secondary | ICD-10-CM

## 2017-07-16 DIAGNOSIS — Z23 Encounter for immunization: Secondary | ICD-10-CM | POA: Diagnosis not present

## 2017-07-16 DIAGNOSIS — I10 Essential (primary) hypertension: Secondary | ICD-10-CM

## 2017-07-16 MED ORDER — ZOSTER VAC RECOMB ADJUVANTED 50 MCG/0.5ML IM SUSR: 0.5000 mL | Freq: Once | INTRAMUSCULAR | 0 refills | Status: AC

## 2017-07-16 MED ORDER — FLUOXETINE HCL 20 MG PO TABS: 40.0000 mg | ORAL_TABLET | Freq: Every day | ORAL | 3 refills | Status: DC

## 2017-07-16 NOTE — Progress Notes (Signed)
Impression and Recommendations:    1. Essential hypertension   2. Stress and adjustment reaction   3. Need for zoster vaccine    -Double the Prozac.  He will let us know how this is doing. -Also continue 1/2 tablet of the losartan hydrochlorothiazide.  Continue to monitor blood pressure.   Education and routine counseling performed. Handouts provided.   Return for mid Jan- come in [place], then OV with me.  The patient was counseled, risk factors were discussed, anticipatory guidance given.  Gross side effects, risk and benefits, and alternatives of medications discussed with patient.  Patient is aware that all medications have potential side effects and we are unable to predict every side effect or drug-drug interaction that may occur.  Expresses verbal understanding and consents to current therapy plan and treatment regimen.  Please see AVS handed out to patient at the end of our visit for further patient instructions/ counseling done pertaining to today's office visit.    Note: This document was prepared using Dragon voice recognition software and may include unintentional dictation errors.     Subjective:    Chief Complaint  Patient presents with  . Follow-up    HPI: Brian Gay is a 51 y.o. male who presents to [place] [facility] today for follow up for HTN.    Problem  Essential Hypertension  Low Hdl (Under 40)    Mood:  - last Ov started pt on prozac for mood.  Feels like it is helping tremendously.  Feels more patient, less agitated but thinks he may need a little more.   Some dec in sexual desire, o/w no problems- which is actually helpful to pt and wife.   Tolerating well no side effects.  Sleeping well.    HTN: - last OV switched from losartan to losartan - HCTZ combo pill.  -  His blood pressure has been controlled at home.  120's/ 70's.    - Patient reports good compliance with blood pressure medications  - Denies medication  S-E   - Smoking Status noted   - He denies new onset of: chest pain, exercise intolerance, shortness of breath, dizziness, visual changes, headache, lower extremity swelling or claudication.   Today their BP is BP: 122/79   Last 3 blood pressure readings in our office are as follows: BP Readings from Last 3 Encounters:  07/16/17 122/79  06/02/17 (!) 148/92  01/26/17 123/84    Pulse Readings from Last 3 Encounters:  07/16/17 75  06/02/17 76  01/26/17 72    Filed Weights   07/16/17 0836  Weight: 217 lb 11.2 oz (98.7 kg)    Patient Care Team    Relationship Specialty Notifications Start End  [name], DO PCP - General Family Medicine  08/04/16   Coolidge Breeze, MD Consulting Physician Dentistry  12/11/15   Daryll Brod, MD Consulting Physician Orthopedic Surgery  01/26/17     Lab Results  Component Value Date   CREATININE 1.05 08/31/2016   BUN 12 08/31/2016   NA 144 08/31/2016   K 5.7 (H) 08/31/2016   CL 105 08/31/2016   CO2 25 08/31/2016    Lab Results  Component Value Date   CHOL 206 (H) 08/31/2016   CHOL 202 (H) 12/12/2015    Lab Results  Component Value Date   HDL 35 (L) 08/31/2016   HDL 38.70 (L) 12/12/2015    Lab Results  Component Value Date   LDLCALC 153 (H)  08/31/2016   LDLCALC 150 (H) 12/12/2015    Lab Results  Component Value Date   TRIG 88 08/31/2016   TRIG 70.0 12/12/2015    Lab Results  Component Value Date   CHOLHDL 5.9 (H) 08/31/2016   CHOLHDL 5 12/12/2015    No results found for: LDLDIRECT ===================================================================   Patient Active Problem List   Diagnosis Date Noted  . Elevated LDL cholesterol level 08/29/2016    Priority: High  . h/o Tobacco use disorder 08/04/2016    Priority: High  . Essential hypertension 10/10/2015    Priority: High  . Low HDL (under 40) 01/26/2017    Priority: Medium  . Tobacco abuse counseling 09/05/2016    Priority: Medium  . Overweight (BMI  25.0-29.9) 08/04/2016    Priority: Medium  . Environmental and seasonal allergies 08/04/2016    Priority: Medium  . Vitamin D deficiency 09/08/2016    Priority: Low  . Sinusitis 06/02/2017  . Stress and adjustment reaction 06/02/2017  . Fatigue- esp AM 08/04/2016  . Closed displaced fracture of middle phalanx of right ring finger 03/16/2016  . Volar plate injury of finger 03/16/2016  . Wellness examination 12/12/2015  . Allergic rhinitis 11/02/2012     Past Medical History:  Diagnosis Date  . Allergy   . Hypertension   . Kidney stones      Past Surgical History:  Procedure Laterality Date  . CLOSED REDUCTION FINGER WITH PERCUTANEOUS PINNING Right 03/19/2016   Procedure: CLOSED REDUCTION right ring  FINGER;  Surgeon: Daryll Brod, MD;  Location: [place];  Service: Orthopedics;  Laterality: Right;  . cyst removed      pilonidal cyst  . WISDOM TOOTH EXTRACTION       Family History  Problem Relation Age of Onset  . Heart disease Mother   . Hypertension Mother   . Heart disease Father   . Cancer Father        prostate  . Prostate cancer Father   . Hyperlipidemia Father   . Stroke Father   . Heart disease Maternal Grandmother   . Heart disease Maternal Grandfather   . Heart disease Paternal Grandmother   . Heart disease Paternal Grandfather   . Colon cancer Neg Hx   . Colon polyps Neg Hx   . Rectal cancer Neg Hx   . Stomach cancer Neg Hx      Social History   Substance and Sexual Activity  Drug Use No  ,  Social History   Substance and Sexual Activity  Alcohol Use Yes  . Alcohol/week: 0.0 oz   Comment: occasional  ,  Social History   Tobacco Use  Smoking Status Former Smoker  . Packs/day: 0.50  . Years: 20.00  . Pack years: 10.00  . Last attempt to quit: 07/2016  . Years since quitting: 0.9  Smokeless Tobacco Current User  . Types: Chew  ,    Current Outpatient Medications on File Prior to Visit  Medication Sig Dispense Refill    . atorvastatin (LIPITOR) 40 MG tablet Take 1/2 tablet nightly at bedtime x 1 week then increase to 1 tablet nightly as tolerated 180 tablet 1  . Cetirizine HCl (ZYRTEC ALLERGY) 10 MG CAPS Daly take Allegra or Claritin or Zyrtec or Xyzal during the allergy season daily 30 capsule   . fluticasone (FLONASE) 50 MCG/ACT nasal spray Place 1 spray into both nostrils 2 (two) times daily. 16 g 6  . Hypertonic Nasal Wash (SINUS RINSE BOTTLE KIT) PACK  Please do sinus rinses of your nasal passages twice daily followed by one spray Flonase each nostril.  Also, feel free to do a sinus rinse after any prolonged period of time outdoors    . losartan-hydrochlorothiazide (HYZAAR) 100-12.5 MG tablet Take 1 tablet by mouth daily. 90 tablet 3  . predniSONE (DELTASONE) 20 MG tablet Take 3 tabs po * 2 days, then 2 tabs for 2 d, then 1 tab 2 d, then 1/2 tab 2 days. 15 tablet 0   No current facility-administered medications on file prior to visit.      Allergies  Allergen Reactions  . Codeine Swelling     Review of Systems:   General:  Denies fever, chills Optho/Auditory:   Denies visual changes, blurred vision Respiratory:   Denies SOB, cough, wheeze, DIB  Cardiovascular:   Denies chest pain, palpitations, painful respirations Gastrointestinal:   Denies nausea, vomiting, diarrhea.  Endocrine:     Denies new hot or cold intolerance Musculoskeletal:  Denies joint swelling, gait issues, or new unexplained myalgias/ arthralgias Skin:  Denies rash, suspicious lesions  Neurological:    Denies dizziness, unexplained weakness, numbness  Psychiatric/Behavioral:   Denies mood changes  Objective:    Blood pressure 122/79, pulse 75, height 6' (1.829 m), weight 217 lb 11.2 oz (98.7 kg).  Body mass index is 29.53 kg/m.  General: Well Developed, well nourished, and in no acute distress.  HEENT: Normocephalic, atraumatic, pupils equal round reactive to light, neck supple, No carotid bruits, no JVD Skin: Warm and  dry, cap RF less 2 sec Cardiac: Regular rate and rhythm, S1, S2 WNL's, no murmurs rubs or gallops Respiratory: ECTA B/L, Not using accessory muscles, speaking in full sentences. NeuroM-Sk: Ambulates w/o assistance, moves ext * 4 w/o difficulty, sensation grossly intact.  Ext: scant edema b/l lower ext Psych: No HI/SI, judgement and insight good, Euthymic mood. Full Affect.

## 2017-07-16 NOTE — Patient Instructions (Addendum)
Come Jan for fasting blood work around the 15th then office visit with me 1-2 weeks later.   Hypertension Hypertension, commonly called high blood pressure, is when the force of blood pumping through the arteries is too strong. The arteries are the blood vessels that carry blood from the heart throughout the body. Hypertension forces the heart to work harder to pump blood and may cause arteries to become narrow or stiff. Having untreated or uncontrolled hypertension can cause heart attacks, strokes, kidney disease, and other problems. A blood pressure reading consists of a higher number over a lower number. Ideally, your blood pressure should be below 120/80. The first ("top") number is called the systolic pressure. It is a measure of the pressure in your arteries as your heart beats. The second ("bottom") number is called the diastolic pressure. It is a measure of the pressure in your arteries as the heart relaxes. What are the causes? The cause of this condition is not known. What increases the risk? Some risk factors for high blood pressure are under your control. Others are not. Factors you can change  Smoking.  Having type 2 diabetes mellitus, high cholesterol, or both.  Not getting enough exercise or physical activity.  Being overweight.  Having too much fat, sugar, calories, or salt (sodium) in your diet.  Drinking too much alcohol. Factors that are difficult or impossible to change  Having chronic kidney disease.  Having a family history of high blood pressure.  Age. Risk increases with age.  Race. You may be at higher risk if you are African-American.  Gender. Men are at higher risk than women before age 23. After age 109, women are at higher risk than men.  Having obstructive sleep apnea.  Stress. What are the signs or symptoms? Extremely high blood pressure (hypertensive crisis) may cause:  Headache.  Anxiety.  Shortness of breath.  Nosebleed.  Nausea and  vomiting.  Severe chest pain.  Jerky movements you cannot control (seizures).  How is this diagnosed? This condition is diagnosed by measuring your blood pressure while you are seated, with your arm resting on a surface. The cuff of the blood pressure monitor will be placed directly against the skin of your upper arm at the level of your heart. It should be measured at least twice using the same arm. Certain conditions can cause a difference in blood pressure between your right and left arms. Certain factors can cause blood pressure readings to be lower or higher than normal (elevated) for a short period of time:  When your blood pressure is higher when you are in a health care provider's office than when you are at home, this is called white coat hypertension. Most people with this condition do not need medicines.  When your blood pressure is higher at home than when you are in a health care provider's office, this is called masked hypertension. Most people with this condition may need medicines to control blood pressure.  If you have a high blood pressure reading during one visit or you have normal blood pressure with other risk factors:  You may be asked to return on a different day to have your blood pressure checked again.  You may be asked to monitor your blood pressure at home for 1 week or longer.  If you are diagnosed with hypertension, you may have other blood or imaging tests to help your health care provider understand your overall risk for other conditions. How is this treated? This condition is  treated by making healthy lifestyle changes, such as eating healthy foods, exercising more, and reducing your alcohol intake. Your health care provider may prescribe medicine if lifestyle changes are not enough to get your blood pressure under control, and if:  Your systolic blood pressure is above 130.  Your diastolic blood pressure is above 80.  Your personal target blood pressure  may vary depending on your medical conditions, your age, and other factors. Follow these instructions at home: Eating and drinking  Eat a diet that is high in fiber and potassium, and low in sodium, added sugar, and fat. An example eating plan is called the DASH (Dietary Approaches to Stop Hypertension) diet. To eat this way: ? Eat plenty of fresh fruits and vegetables. Try to fill half of your plate at each meal with fruits and vegetables. ? Eat whole grains, such as whole wheat pasta, brown rice, or whole grain bread. Fill about one quarter of your plate with whole grains. ? Eat or drink low-fat dairy products, such as skim milk or low-fat yogurt. ? Avoid fatty cuts of meat, processed or cured meats, and poultry with skin. Fill about one quarter of your plate with lean proteins, such as fish, chicken without skin, beans, eggs, and tofu. ? Avoid premade and processed foods. These tend to be higher in sodium, added sugar, and fat.  Reduce your daily sodium intake. Most people with hypertension should eat less than 1,500 mg of sodium a day.  Limit alcohol intake to no more than 1 drink a day for nonpregnant women and 2 drinks a day for men. One drink equals 12 oz of beer, 5 oz of wine, or 1 oz of hard liquor. Lifestyle  Work with your health care provider to maintain a healthy body weight or to lose weight. Ask what an ideal weight is for you.  Get at least 30 minutes of exercise that causes your heart to beat faster (aerobic exercise) most days of the week. Activities may include walking, swimming, or biking.  Include exercise to strengthen your muscles (resistance exercise), such as pilates or lifting weights, as part of your weekly exercise routine. Try to do these types of exercises for 30 minutes at least 3 days a week.  Do not use any products that contain nicotine or tobacco, such as cigarettes and e-cigarettes. If you need help quitting, ask your health care provider.  Monitor your  blood pressure at home as told by your health care provider.  Keep all follow-up visits as told by your health care provider. This is important. Medicines  Take over-the-counter and prescription medicines only as told by your health care provider. Follow directions carefully. Blood pressure medicines must be taken as prescribed.  Do not skip doses of blood pressure medicine. Doing this puts you at risk for problems and can make the medicine less effective.  Ask your health care provider about side effects or reactions to medicines that you should watch for. Contact a health care provider if:  You think you are having a reaction to a medicine you are taking.  You have headaches that keep coming back (recurring).  You feel dizzy.  You have swelling in your ankles.  You have trouble with your vision. Get help right away if:  You develop a severe headache or confusion.  You have unusual weakness or numbness.  You feel faint.  You have severe pain in your chest or abdomen.  You vomit repeatedly.  You have trouble breathing. Summary  Hypertension is when the force of blood pumping through your arteries is too strong. If this condition is not controlled, it may put you at risk for serious complications.  Your personal target blood pressure may vary depending on your medical conditions, your age, and other factors. For most people, a normal blood pressure is less than 120/80.  Hypertension is treated with lifestyle changes, medicines, or a combination of both. Lifestyle changes include weight loss, eating a healthy, low-sodium diet, exercising more, and limiting alcohol. This information is not intended to replace advice given to you by your health care provider. Make sure you discuss any questions you have with your health care provider. Document Released: 08/03/2005 Document Revised: 07/01/2016 Document Reviewed: 07/01/2016 Elsevier Interactive Patient Education  [employer].

## 2017-07-29 ENCOUNTER — Ambulatory Visit: Payer: Self-pay | Admitting: Adult Health

## 2017-09-07 ENCOUNTER — Ambulatory Visit (INDEPENDENT_AMBULATORY_CARE_PROVIDER_SITE_OTHER): Payer: 59 | Admitting: Family Medicine

## 2017-09-07 ENCOUNTER — Encounter: Payer: Self-pay | Admitting: Family Medicine

## 2017-09-07 VITALS — BP 122/88 | HR 81 | Ht 72.0 in | Wt 219.3 lb

## 2017-09-07 DIAGNOSIS — E559 Vitamin D deficiency, unspecified: Secondary | ICD-10-CM

## 2017-09-07 DIAGNOSIS — E663 Overweight: Secondary | ICD-10-CM | POA: Diagnosis not present

## 2017-09-07 DIAGNOSIS — F4329 Adjustment disorder with other symptoms: Secondary | ICD-10-CM | POA: Diagnosis not present

## 2017-09-07 DIAGNOSIS — E7800 Pure hypercholesterolemia, unspecified: Secondary | ICD-10-CM | POA: Diagnosis not present

## 2017-09-07 DIAGNOSIS — I10 Essential (primary) hypertension: Secondary | ICD-10-CM | POA: Diagnosis not present

## 2017-09-07 MED ORDER — ATORVASTATIN CALCIUM 40 MG PO TABS: ORAL_TABLET | ORAL | 1 refills | Status: DC

## 2017-09-07 NOTE — Progress Notes (Signed)
Impression and Recommendations:    1. Essential hypertension   2. Overweight (BMI 25.0-29.9)   3. Stress and adjustment reaction   4. Elevated LDL cholesterol level   5. Vitamin D deficiency      1. Vitamin D deficiency: Pt is tolerating his daily supplemental well. Pt instructed to continue taking this as prescribed. Will recheck in near future.  2. Essential hypertension: Pt is taking 1/2 tablet of losartan HCTZ combo and tolerating this well. After discussion, we will increase to 1 full tablet of this medication daily. Pt instructed to continue taking BP daily and keeping a log of this at home. Due to his age, his BP goal is to be consistently < 130/80.   -Patient states he will get back to eating healthier and losing some weight to come the spring.  3. Overweight:  Pt has stated he wants to exercise and lose weight for his health improvement.  t highly encouraged to lose weight and exercise daily as it will help with his overall health including HTN and mood.  Dietary and exercise guidelines provided.  4. Stress and adjustment reaction: Pt is tolerating his increase in prozac well. Pt instructed to continue taking this as prescribed and listed below. Pt is very grateful and reports great improvement in his mood after taking his medications.  5. Elevated LDL cholesterol level - Pt is tolerating his medication well. Instructed to continue taking this as described below. Dietary and exercise guidelines discussed with patient. Recommended pt to reduce intake of saturated, trans fats and fatty carbohydrates. Handouts provided if desired.  -Will obtain FBW today. Follow up in 1 week to discuss lab results.  Education and handouts provided. Pt discussed losing weight to improve      Education and routine counseling performed. Handouts provided.   Return for increased Bp meds to 1 full tab Daily- f/up [address] sooner if concerns/ Bp not at goal.  The patient was counseled, risk  factors were discussed, anticipatory guidance given.  Gross side effects, risk and benefits, and alternatives of medications discussed with patient.  Patient is aware that all medications have potential side effects and we are unable to predict every side effect or drug-drug interaction that may occur.  Expresses verbal understanding and consents to current therapy plan and treatment regimen.  Please see AVS handed out to patient at the end of our visit for further patient instructions/ counseling done pertaining to today'Brian office visit.    Note: This document was prepared using Dragon voice recognition software and may include unintentional dictation errors.  This document serves as a record of services personally performed by DMellody Dance DO. It was created on her behalf by NMayer Masker a trained medical scribe. The creation of this record is based on the scribe'Brian personal observations and the provider'Brian statements to them.   I have reviewed the above medical documentation for accuracy and completeness and I concur.  DMellody Dance01/22/19 2:20 PM    Subjective:    Chief Complaint  Patient presents with  . Follow-up    HPI: [name] a 52y.o. male [facility] and mood.    No problems updated.  Mood:  Last OV we doubled prozac for mood and he reports he is feeling better but reports more decreased libido from last visit. Otherwise there are no other problems. He states "it takes longer" when undergoing  sexual intercourse, but denies any other adverse side effects. He is tolerating this well with increased stress tolerance at work. He also reports being more energetic and is sleeping better.  HTN: Pt is taking his losartan-HCTZ regularly and is compliant and tolerating this well.   -  His blood pressure has been controlled at home.  120'Brian/ 80'Brian.  He reports it is rarely 120 / upper 70s- mostly  48-54 diastolic.  - Patient reports good compliance with blood pressure medications  - Denies medication Brian-E.    - Smoking Status noted  - He denies new onset of: chest pain, exercise intolerance, shortness of breath, dizziness, visual changes, headache, lower extremity swelling or claudication.   Today their BP is BP: 122/88   Pt also states he wants to lose more weight in the future.    Last 3 blood pressure readings in our office are as follows: BP Readings from Last 3 Encounters:  09/07/17 122/88  07/16/17 122/79  06/02/17 (!) 148/92    Pulse Readings from Last 3 Encounters:  09/07/17 81  07/16/17 75  06/02/17 76    Filed Weights   09/07/17 0929  Weight: 219 lb 4.8 oz (99.5 kg)    Patient Care Team    Relationship Specialty Notifications Start End  [name], DO PCP - General Family Medicine  08/04/16   Coolidge Breeze, MD Consulting Physician Dentistry  12/11/15   Daryll Brod, MD Consulting Physician Orthopedic Surgery  01/26/17     Lab Results  Component Value Date   CREATININE 1.05 08/31/2016   BUN 12 08/31/2016   NA 144 08/31/2016   K 5.7 (H) 08/31/2016   CL 105 08/31/2016   CO2 25 08/31/2016    Lab Results  Component Value Date   CHOL 206 (H) 08/31/2016   CHOL 202 (H) 12/12/2015    Lab Results  Component Value Date   HDL 35 (L) 08/31/2016   HDL 38.70 (L) 12/12/2015    Lab Results  Component Value Date   LDLCALC 153 (H) 08/31/2016   [place] 150 (H) 12/12/2015    Lab Results  Component Value Date   TRIG 88 08/31/2016   TRIG 70.0 12/12/2015    Lab Results  Component Value Date   CHOLHDL 5.9 (H) 08/31/2016   CHOLHDL 5 12/12/2015    No results found for: LDLDIRECT ===================================================================   Patient Active Problem List   Diagnosis Date Noted  . Elevated LDL cholesterol level 08/29/2016    Priority: High  . h/o Tobacco use disorder 08/04/2016    Priority: High  . Essential  hypertension 10/10/2015    Priority: High  . Low HDL (under 40) 01/26/2017    Priority: Medium  . Tobacco abuse counseling 09/05/2016    Priority: Medium  . Overweight (BMI 25.0-29.9) 08/04/2016    Priority: Medium  . Environmental and seasonal allergies 08/04/2016    Priority: Medium  . Vitamin D deficiency 09/08/2016    Priority: Low  . Sinusitis 06/02/2017  . Stress and adjustment reaction 06/02/2017  . Fatigue- esp AM 08/04/2016  . Closed displaced fracture of middle phalanx of right ring finger 03/16/2016  . Volar plate injury of finger 03/16/2016  . Wellness examination 12/12/2015  . Allergic rhinitis 11/02/2012     Past Medical History:  Diagnosis Date  . Allergy   . Hypertension   . Kidney stones      Past Surgical History:  Procedure Laterality Date  . CLOSED REDUCTION FINGER WITH PERCUTANEOUS PINNING Right 03/19/2016  Procedure: CLOSED REDUCTION right ring  FINGER;  Surgeon: Daryll Brod, MD;  Location: [facility];  Service: Orthopedics;  Laterality: Right;  . cyst removed      pilonidal cyst  . WISDOM TOOTH EXTRACTION       Family History  Problem Relation Age of Onset  . Heart disease Mother   . Hypertension Mother   . Heart disease Father   . Cancer Father        prostate  . Prostate cancer Father   . Hyperlipidemia Father   . Stroke Father   . Heart disease Maternal Grandmother   . Heart disease Maternal Grandfather   . Heart disease Paternal Grandmother   . Heart disease Paternal Grandfather   . Colon cancer Neg Hx   . Colon polyps Neg Hx   . Rectal cancer Neg Hx   . Stomach cancer Neg Hx      Social History   Substance and Sexual Activity  Drug Use No  ,  Social History   Substance and Sexual Activity  Alcohol Use Yes  . Alcohol/week: 0.0 oz   Comment: occasional  ,  Social History   Tobacco Use  Smoking Status Former Smoker  . Packs/day: 0.50  . Years: 20.00  . Pack years: 10.00  . Last attempt to quit:  07/2016  . Years since quitting: 1.1  Smokeless Tobacco Current User  . Types: Chew  ,    Current Outpatient Medications on File Prior to Visit  Medication Sig Dispense Refill  . Cetirizine HCl (ZYRTEC ALLERGY) 10 MG CAPS Daly take Allegra or Claritin or Zyrtec or Xyzal during the allergy season daily 30 capsule   . FLUoxetine (PROZAC) 20 MG tablet Take 2 tablets (40 mg total) by mouth daily. 180 tablet 3  . fluticasone (FLONASE) 50 MCG/ACT nasal spray Place 1 spray into both nostrils 2 (two) times daily. 16 g 6  . Hypertonic Nasal Wash (SINUS RINSE BOTTLE KIT) PACK Please do sinus rinses of your nasal passages twice daily followed by one spray Flonase each nostril.  Also, feel free to do a sinus rinse after any prolonged period of time outdoors    . losartan-hydrochlorothiazide (HYZAAR) 100-12.5 MG tablet Take 1 tablet by mouth daily. 90 tablet 3  . predniSONE (DELTASONE) 20 MG tablet Take 3 tabs po * 2 days, then 2 tabs for 2 d, then 1 tab 2 d, then 1/2 tab 2 days. 15 tablet 0   No current facility-administered medications on file prior to visit.      Allergies  Allergen Reactions  . Codeine Swelling     Review of Systems:   General:  Denies fever, chills Optho/Auditory:   Denies visual changes, blurred vision Respiratory:   Denies SOB, cough, wheeze, DIB  Cardiovascular:   Denies chest pain, palpitations, painful respirations Gastrointestinal:   Denies nausea, vomiting, diarrhea.  Endocrine:     Denies new hot or cold intolerance Musculoskeletal:  Denies joint swelling, gait issues, or new unexplained myalgias/ arthralgias Skin:  Denies rash, suspicious lesions  Neurological:    Denies dizziness, unexplained weakness, numbness  Psychiatric/Behavioral:   Denies mood changes  Objective:    Blood pressure 122/88, pulse 81, height 6' (1.829 m), weight 219 lb 4.8 oz (99.5 kg), SpO2 98 %.  Body mass index is 29.74 kg/m.  General: Well Developed, well nourished, and in no  acute distress.  HEENT: Normocephalic, atraumatic, pupils equal round reactive to light, neck supple, No carotid bruits, no  JVD Skin: Warm and dry, cap RF less 2 sec Cardiac: Regular rate and rhythm, S1, S2 WNL'Brian, no murmurs rubs or gallops Respiratory: ECTA B/L, Not using accessory muscles, speaking in full sentences. NeuroM-Sk: Ambulates w/o assistance, moves ext * 4 w/o difficulty, sensation grossly intact.  Ext: scant edema b/l lower ext Psych: No HI/SI, judgement and insight good, Euthymic mood. Full Affect.

## 2017-09-07 NOTE — Patient Instructions (Addendum)
Please go from a half a tablet of losartan-hydrochlorothiazide and increase to 1 full tablet daily.   Please continue to check your blood pressure and due to your young age of 52 years old, blood pressure should be less than 120/ 80 on a regular basis.     DASH Eating Plan DASH stands for "Dietary Approaches to Stop Hypertension." The DASH eating plan is a healthy eating plan that has been shown to reduce high blood pressure (hypertension). It may also reduce your risk for type 2 diabetes, heart disease, and stroke. The DASH eating plan may also help with weight loss. What are tips for following this plan? General guidelines  Avoid eating more than 2,300 mg (milligrams) of salt (sodium) a day. If you have hypertension, you may need to reduce your sodium intake to 1,500 mg a day.  Limit alcohol intake to no more than 1 drink a day for nonpregnant women and 2 drinks a day for men. One drink equals 12 oz of beer, 5 oz of wine, or 1 oz of hard liquor.  Work with your health care provider to maintain a healthy body weight or to lose weight. Ask what an ideal weight is for you.  Get at least 30 minutes of exercise that causes your heart to beat faster (aerobic exercise) most days of the week. Activities may include walking, swimming, or biking.  Work with your health care provider or diet and nutrition specialist (dietitian) to adjust your eating plan to your individual calorie needs. Reading food labels  Check food labels for the amount of sodium per serving. Choose foods with less than 5 percent of the Daily Value of sodium. Generally, foods with less than 300 mg of sodium per serving fit into this eating plan.  To find whole grains, look for the word "whole" as the first word in the ingredient list. Shopping  Buy products labeled as "low-sodium" or "no salt added."  Buy fresh foods. Avoid canned foods and premade or frozen meals. Cooking  Avoid adding salt when cooking. Use salt-free  seasonings or herbs instead of table salt or sea salt. Check with your health care provider or pharmacist before using salt substitutes.  Do not fry foods. Cook foods using healthy methods such as baking, boiling, grilling, and broiling instead.  Cook with heart-healthy oils, such as olive, canola, soybean, or sunflower oil. Meal planning   Eat a balanced diet that includes: ? 5 or more servings of fruits and vegetables each day. At each meal, try to fill half of your plate with fruits and vegetables. ? Up to 6-8 servings of whole grains each day. ? Less than 6 oz of lean meat, poultry, or fish each day. A 3-oz serving of meat is about the same size as a deck of cards. One egg equals 1 oz. ? 2 servings of low-fat dairy each day. ? A serving of nuts, seeds, or beans 5 times each week. ? Heart-healthy fats. Healthy fats called Omega-3 fatty acids are found in foods such as flaxseeds and coldwater fish, like sardines, salmon, and mackerel.  Limit how much you eat of the following: ? Canned or prepackaged foods. ? Food that is high in trans fat, such as fried foods. ? Food that is high in saturated fat, such as fatty meat. ? Sweets, desserts, sugary drinks, and other foods with added sugar. ? Full-fat dairy products.  Do not salt foods before eating.  Try to eat at least 2 vegetarian meals each week.  Eat more home-cooked food and less restaurant, buffet, and fast food.  When eating at a restaurant, ask that your food be prepared with less salt or no salt, if possible. What foods are recommended? The items listed may not be a complete list. Talk with your dietitian about what dietary choices are best for you. Grains Whole-grain or whole-wheat bread. Whole-grain or whole-wheat pasta. Brown rice. [place]. Bulgur. Whole-grain and low-sodium cereals. Pita bread. Low-fat, low-sodium crackers. Whole-wheat flour tortillas. Vegetables Fresh or frozen vegetables (raw, steamed, roasted,  or grilled). Low-sodium or reduced-sodium tomato and vegetable juice. Low-sodium or reduced-sodium tomato sauce and tomato paste. Low-sodium or reduced-sodium canned vegetables. Fruits All fresh, dried, or frozen fruit. Canned fruit in natural juice (without added sugar). Meat and other protein foods Skinless chicken or [place]. Ground chicken or [place]. Pork with fat trimmed off. Fish and seafood. Egg whites. Dried beans, peas, or lentils. Unsalted nuts, nut butters, and seeds. Unsalted canned beans. Lean cuts of beef with fat trimmed off. Low-sodium, lean deli meat. Dairy Low-fat (1%) or fat-free (skim) milk. Fat-free, low-fat, or reduced-fat cheeses. Nonfat, low-sodium ricotta or cottage cheese. Low-fat or nonfat yogurt. Low-fat, low-sodium cheese. Fats and oils Soft margarine without trans fats. Vegetable oil. Low-fat, reduced-fat, or light mayonnaise and salad dressings (reduced-sodium). Canola, safflower, olive, soybean, and sunflower oils. Avocado. Seasoning and other foods Herbs. Spices. Seasoning mixes without salt. Unsalted popcorn and pretzels. Fat-free sweets. What foods are not recommended? The items listed may not be a complete list. Talk with your dietitian about what dietary choices are best for you. Grains Baked goods made with fat, such as croissants, muffins, or some breads. Dry pasta or rice meal packs. Vegetables Creamed or fried vegetables. Vegetables in a cheese sauce. Regular canned vegetables (not low-sodium or reduced-sodium). Regular canned tomato sauce and paste (not low-sodium or reduced-sodium). Regular tomato and vegetable juice (not low-sodium or reduced-sodium). Angie Fava. Olives. Fruits Canned fruit in a light or heavy syrup. Fried fruit. Fruit in cream or butter sauce. Meat and other protein foods Fatty cuts of meat. Ribs. Fried meat. Berniece Salines. Sausage. Bologna and other processed lunch meats. Salami. Fatback. Hotdogs. Bratwurst. Salted nuts and seeds. Canned beans  with added salt. Canned or smoked fish. Whole eggs or egg yolks. Chicken or [place] with skin. Dairy Whole or 2% milk, cream, and half-and-half. Whole or full-fat cream cheese. Whole-fat or sweetened yogurt. Full-fat cheese. Nondairy creamers. Whipped toppings. Processed cheese and cheese spreads. Fats and oils Butter. Stick margarine. Lard. Shortening. Ghee. Bacon fat. Tropical oils, such as coconut, palm kernel, or palm oil. Seasoning and other foods Salted popcorn and pretzels. Onion salt, garlic salt, seasoned salt, table salt, and sea salt. Worcestershire sauce. Tartar sauce. Barbecue sauce. Teriyaki sauce. Soy sauce, including reduced-sodium. Steak sauce. Canned and packaged gravies. Fish sauce. Oyster sauce. Cocktail sauce. Horseradish that you find on the shelf. Ketchup. Mustard. Meat flavorings and tenderizers. Bouillon cubes. Hot sauce and Tabasco sauce. Premade or packaged marinades. Premade or packaged taco seasonings. Relishes. Regular salad dressings. Where to find more information:  National Heart, Lung, and [place]: https://wilson-eaton.com/  American Heart Association: www.heart.org Summary  The DASH eating plan is a healthy eating plan that has been shown to reduce high blood pressure (hypertension). It may also reduce your risk for type 2 diabetes, heart disease, and stroke.  With the DASH eating plan, you should limit salt (sodium) intake to 2,300 mg a day. If you have hypertension, you may need to reduce your sodium intake  to 1,500 mg a day.  When on the DASH eating plan, aim to eat more fresh fruits and vegetables, whole grains, lean proteins, low-fat dairy, and heart-healthy fats.  Work with your health care provider or diet and nutrition specialist (dietitian) to adjust your eating plan to your individual calorie needs. This information is not intended to replace advice given to you by your health care provider. Make sure you discuss any questions you have with your health  care provider. Document Released: 07/23/2011 Document Revised: 07/27/2016 Document Reviewed: 07/27/2016 Elsevier Interactive Patient Education  [employer].

## 2017-09-08 LAB — COMPREHENSIVE METABOLIC PANEL
ALBUMIN: 4.4 g/dL (ref 3.5–5.5)
ALK PHOS: 99 IU/L (ref 39–117)
ALT: 20 IU/L (ref 0–44)
AST: 17 IU/L (ref 0–40)
Albumin/Globulin Ratio: 1.8 (ref 1.2–2.2)
BUN / CREAT RATIO: 14 (ref 9–20)
BUN: 13 mg/dL (ref 6–24)
Bilirubin Total: 0.4 mg/dL (ref 0.0–1.2)
CALCIUM: 9.3 mg/dL (ref 8.7–10.2)
CO2: 20 mmol/L (ref 20–29)
CREATININE: 0.94 mg/dL (ref 0.76–1.27)
Chloride: 104 mmol/L (ref 96–106)
GFR calc Af Amer: 108 mL/min/{1.73_m2} (ref >59)
GFR calc non Af Amer: 93 mL/min/{1.73_m2} (ref >59)
GLUCOSE: 98 mg/dL (ref 65–99)
Globulin, Total: 2.5 g/dL (ref 1.5–4.5)
Potassium: 4.6 mmol/L (ref 3.5–5.2)
Sodium: 140 mmol/L (ref 134–144)
Total Protein: 6.9 g/dL (ref 6.0–8.5)

## 2017-09-08 LAB — CBC WITH DIFFERENTIAL/PLATELET
BASOS ABS: 0 10*3/uL (ref 0.0–0.2)
Basos: 1 %
EOS (ABSOLUTE): 0.1 10*3/uL (ref 0.0–0.4)
Eos: 1 %
HEMOGLOBIN: 16.6 g/dL (ref 13.0–17.7)
Hematocrit: 48.8 % (ref 37.5–51.0)
IMMATURE GRANS (ABS): 0 10*3/uL (ref 0.0–0.1)
Immature Granulocytes: 0 %
LYMPHS ABS: 1.7 10*3/uL (ref 0.7–3.1)
LYMPHS: 30 %
MCH: 30.8 pg (ref 26.6–33.0)
MCHC: 34 g/dL (ref 31.5–35.7)
MCV: 91 fL (ref 79–97)
MONOCYTES: 9 %
Monocytes Absolute: 0.5 10*3/uL (ref 0.1–0.9)
Neutrophils Absolute: 3.4 10*3/uL (ref 1.4–7.0)
Neutrophils: 59 %
PLATELETS: 223 10*3/uL (ref 150–379)
RBC: 5.39 x10E6/uL (ref 4.14–5.80)
RDW: 14.1 % (ref 12.3–15.4)
WBC: 5.8 10*3/uL (ref 3.4–10.8)

## 2017-09-08 LAB — LIPID PANEL
CHOL/HDL RATIO: 5.4 ratio — AB (ref 0.0–5.0)
Cholesterol, Total: 205 mg/dL — ABNORMAL HIGH (ref 100–199)
HDL: 38 mg/dL — AB (ref >39)
LDL Calculated: 143 mg/dL — ABNORMAL HIGH (ref 0–99)
TRIGLYCERIDES: 121 mg/dL (ref 0–149)
VLDL Cholesterol Cal: 24 mg/dL (ref 5–40)

## 2017-09-08 LAB — TSH: TSH: 1.74 u[IU]/mL (ref 0.450–4.500)

## 2017-09-08 LAB — VITAMIN D 25 HYDROXY (VIT D DEFICIENCY, FRACTURES): VIT D 25 HYDROXY: 23.6 ng/mL — AB (ref 30.0–100.0)

## 2017-09-08 LAB — HEMOGLOBIN A1C
ESTIMATED AVERAGE GLUCOSE: 111 mg/dL
HEMOGLOBIN A1C: 5.5 % (ref 4.8–5.6)

## 2017-11-09 ENCOUNTER — Encounter: Payer: Self-pay | Admitting: Family Medicine

## 2017-11-09 ENCOUNTER — Ambulatory Visit (INDEPENDENT_AMBULATORY_CARE_PROVIDER_SITE_OTHER): Payer: 59 | Admitting: Family Medicine

## 2017-11-09 VITALS — BP 108/74 | HR 73 | Temp 98.7°F | Ht 72.0 in | Wt 222.8 lb

## 2017-11-09 DIAGNOSIS — J019 Acute sinusitis, unspecified: Secondary | ICD-10-CM | POA: Diagnosis not present

## 2017-11-09 DIAGNOSIS — H1032 Unspecified acute conjunctivitis, left eye: Secondary | ICD-10-CM | POA: Diagnosis not present

## 2017-11-09 DIAGNOSIS — H6692 Otitis media, unspecified, left ear: Secondary | ICD-10-CM

## 2017-11-09 DIAGNOSIS — B9689 Other specified bacterial agents as the cause of diseases classified elsewhere: Secondary | ICD-10-CM | POA: Diagnosis not present

## 2017-11-09 MED ORDER — AMOXICILLIN 500 MG PO CAPS: 1000.0000 mg | ORAL_CAPSULE | Freq: Two times a day (BID) | ORAL | 0 refills | Status: DC

## 2017-11-09 NOTE — Patient Instructions (Signed)
Please use Naphcon-A over-the-counter eyedrops as needed for both of your eyes especially if they are red or group B.  Please continue your allergy medicine of Zyrtec and Flonase as well as the nasal rinses.  In addition, you can take some Tylenol Cold and sinus or Advil Cold and Sinus in addition to the antibiotics and above eyedrops.

## 2017-11-09 NOTE — Progress Notes (Signed)
Acute Care Office visit  Assessment and plan:  1. Left otitis media, unspecified otitis media type   2. Acute bacterial rhinosinusitis   3. Acute conjunctivitis of left eye, unspecified acute conjunctivitis type     - Viral vs Allergic vs Bacterial causes for pt's symptoms reveiwed.  - Since his symptoms have gone on for a week and present as ear infection, antibiotics recommended - amoxicillin emphasized as #1 treatment for eyes, ears, nose, throat infection.  - Supportive care and various OTC medications discussed in addition to any prescribed.  - Naphcon-A recommended for eye redness and irritation.  - Advised to continue using cough & sinus decongestants at home.  Patient may also incorporate something like Allegra D during this time.  - Advised the patient to continue using AYR or Neilmed sinus rinses BID followed by flonase BID (one spray to each nostril). Continue Zyrtec PRN.   - Sleep extra, no drinking.  Emphasized the importance of supportive care to enhance immunity.  - Call or RTC if new symptoms, or if no improvement or worse over next several days.     Meds ordered this encounter  Medications  . amoxicillin (AMOXIL) 500 MG capsule    Sig: Take 2 capsules (1,000 mg total) by mouth 2 (two) times daily.    Dispense:  40 capsule    Refill:  0    No orders of the defined types were placed in this encounter.   Gross side effects, risk and benefits, and alternatives of medications discussed with patient.  Patient is aware that all medications have potential side effects and we are unable to predict every sideeffect or drug-drug interaction that may occur.  Expresses verbal understanding and consents to current therapy plan and treatment regiment.   Education and routine counseling performed. Handouts provided.  Anticipatory guidance and routine counseling done re: condition, txmnt options and need for follow up. All questions of patient's were  answered.  Return if symptoms worsen or fail to improve.  Please see AVS handed out to patient at the end of our visit for additional patient instructions/ counseling done pertaining to today's office visit.  Note: This document was partially repared using Dragon voice recognition software and may include unintentional dictation errors.  This document serves as a record of services personally performed by [name], DO. It was created on her behalf by Toni Amend, a trained medical scribe. The creation of this record is based on the scribe's personal observations and the provider's statements to them.   I have reviewed the above medical documentation for accuracy and completeness and I concur.  [name] 11/09/17 4:50 PM    Subjective:    Chief Complaint  Patient presents with  . Sinus Problem    HPI:  Pt presents with Sx for seven days   C/o:   Bad sore throat, feels pressure in his ears now.  Some bilateral ear pain.  His ears "pop" when he swallows.  Last night, he got out of the shower and felt it going into his eye.  Notes conjunctivitis in left eye starting yesterday.  Overall, feels stuffed up.  Has dry cough.  Notes positive sick contacts - people at work.  A guy at his office "had the same crap with his eye and received antibiotic ointment drops."    Denies:   Denies fever.  Denies one-sided face pain.  Denies exposure to strep (that he knows of).  For symptoms patient has tried:  Nasal  sprays, which are beginning to "loosen it up" (his congestion).  Also using OTC sinus treatment and daily sinus rinses.  Overall getting:   Symptoms are not resolving, worsening on the left side.   Patient Care Team    Relationship Specialty Notifications Start End  [name], DO PCP - General Family Medicine  08/04/16   Coolidge Breeze, MD Consulting Physician Dentistry  12/11/15   Daryll Brod, MD Consulting Physician Orthopedic Surgery  01/26/17      Past medical history, Surgical history, Family history reviewed and noted below, Social history, Allergies, and Medications have been entered into the medical record, reviewed and changed as needed.   Allergies  Allergen Reactions  . Codeine Swelling    Review of Systems: - see above HPI for pertinent positives General:   No F/C, wt loss Pulm:   No DIB, pleuritic chest pain Card:  No CP, palpitations Abd:  No n/v/d or pain Ext:  No inc edema from baseline   Objective:   Blood pressure 108/74, pulse 73, temperature 98.7 F (37.1 C), height 6' (1.829 m), weight 222 lb 12.8 oz (101.1 kg), SpO2 97 %. Body mass index is 30.22 kg/m. General: Well Developed, well nourished, appropriate for stated age.  Neuro: Alert and oriented x3, extra-ocular muscles intact, sensation grossly intact.  HEENT: Normocephalic, atraumatic, pupils equal round reactive to light, neck supple, no masses.  Tender lymphadenopathy present in left periauricular region.  TM's intact B/L, left TM retracted and red on the whole superior 1/3 aspect of the TM.  left conjunctiva injected.  Ocular discharge from right eye, but conjunctiva is essentially clear; no injection.  Nares- patent, clear d/c, OP- clear, mild erythema, No TTP sinuses Skin: Warm and dry, no gross rash. Cardiac: RRR, S1 S2,  no murmurs rubs or gallops.  Respiratory: ECTA B/L and A/P, Not using accessory muscles, speaking in full sentences- unlabored. Vascular:  No gross lower ext edema, cap RF less 2 sec. Psych: No HI/SI, judgement and insight good, Euthymic mood. Full Affect.

## 2017-11-25 ENCOUNTER — Ambulatory Visit (INDEPENDENT_AMBULATORY_CARE_PROVIDER_SITE_OTHER): Payer: 59 | Admitting: Adult Health

## 2017-11-25 ENCOUNTER — Encounter: Payer: Self-pay | Admitting: Adult Health

## 2017-11-25 VITALS — BP 95/58 | HR 79 | Temp 97.9°F | Ht 72.0 in | Wt 216.0 lb

## 2017-11-25 DIAGNOSIS — J209 Acute bronchitis, unspecified: Secondary | ICD-10-CM | POA: Insufficient documentation

## 2017-11-25 DIAGNOSIS — J0100 Acute maxillary sinusitis, unspecified: Secondary | ICD-10-CM

## 2017-11-25 DIAGNOSIS — R062 Wheezing: Secondary | ICD-10-CM | POA: Insufficient documentation

## 2017-11-25 MED ORDER — FLUTICASONE PROPIONATE 50 MCG/ACT NA SUSP: 1.0000 | Freq: Two times a day (BID) | NASAL | 6 refills | Status: DC

## 2017-11-25 MED ORDER — PREDNISONE 20 MG PO TABS: ORAL_TABLET | ORAL | 0 refills | Status: DC

## 2017-11-25 MED ORDER — BENZONATATE 200 MG PO CAPS: 200.0000 mg | ORAL_CAPSULE | Freq: Two times a day (BID) | ORAL | 0 refills | Status: DC | PRN

## 2017-11-25 MED ORDER — AMOXICILLIN-POT CLAVULANATE 875-125 MG PO TABS: 1.0000 | ORAL_TABLET | Freq: Two times a day (BID) | ORAL | 0 refills | Status: DC

## 2017-11-25 MED ORDER — ALBUTEROL SULFATE HFA 108 (90 BASE) MCG/ACT IN AERS: 1.0000 | INHALATION_SPRAY | Freq: Four times a day (QID) | RESPIRATORY_TRACT | 1 refills | Status: DC | PRN

## 2017-11-25 NOTE — Patient Instructions (Signed)
Acute Bronchitis, Adult Acute bronchitis is sudden (acute) swelling of the air tubes (bronchi) in the lungs. Acute bronchitis causes these tubes to fill with mucus, which can make it hard to breathe. It can also cause coughing or wheezing. In adults, acute bronchitis usually goes away within 2 weeks. A cough caused by bronchitis may last up to 3 weeks. Smoking, allergies, and asthma can make the condition worse. Repeated episodes of bronchitis may cause further lung problems, such as chronic obstructive pulmonary disease (COPD). What are the causes? This condition can be caused by germs and by substances that irritate the lungs, including:  Cold and flu viruses. This condition is most often caused by the same virus that causes a cold.  Bacteria.  Exposure to tobacco smoke, dust, fumes, and air pollution.  What increases the risk? This condition is more likely to develop in people who:  Have close contact with someone with acute bronchitis.  Are exposed to lung irritants, such as tobacco smoke, dust, fumes, and vapors.  Have a weak immune system.  Have a respiratory condition such as asthma.  What are the signs or symptoms? Symptoms of this condition include:  A cough.  Coughing up clear, yellow, or green mucus.  Wheezing.  Chest congestion.  Shortness of breath.  A fever.  Body aches.  Chills.  A sore throat.  How is this diagnosed? This condition is usually diagnosed with a physical exam. During the exam, your health care provider may order tests, such as chest X-rays, to rule out other conditions. He or she may also:  Test a sample of your mucus for bacterial infection.  Check the level of oxygen in your blood. This is done to check for pneumonia.  Do a chest X-ray or lung function testing to rule out pneumonia and other conditions.  Perform blood tests.  Your health care provider will also ask about your symptoms and medical history. How is this  treated? Most cases of acute bronchitis clear up over time without treatment. Your health care provider may recommend:  Drinking more fluids. Drinking more makes your mucus thinner, which may make it easier to breathe.  Taking a medicine for a fever or cough.  Taking an antibiotic medicine.  Using an inhaler to help improve shortness of breath and to control a cough.  Using a cool mist vaporizer or humidifier to make it easier to breathe.  Follow these instructions at home: Medicines  Take over-the-counter and prescription medicines only as told by your health care provider.  If you were prescribed an antibiotic, take it as told by your health care provider. Do not stop taking the antibiotic even if you start to feel better. General instructions  Get plenty of rest.  Drink enough fluids to keep your urine clear or pale yellow.  Avoid smoking and secondhand smoke. Exposure to cigarette smoke or irritating chemicals will make bronchitis worse. If you smoke and you need help quitting, ask your health care provider. Quitting smoking will help your lungs heal faster.  Use an inhaler, cool mist vaporizer, or humidifier as told by your health care provider.  Keep all follow-up visits as told by your health care provider. This is important. How is this prevented? To lower your risk of getting this condition again:  Wash your hands often with soap and water. If soap and water are not available, use hand sanitizer.  Avoid contact with people who have cold symptoms.  Try not to touch your hands to your   mouth, nose, or eyes.  Make sure to get the flu shot every year.  Contact a health care provider if:  Your symptoms do not improve in 2 weeks of treatment. Get help right away if:  You cough up blood.  You have chest pain.  You have severe shortness of breath.  You become dehydrated.  You faint or keep feeling like you are going to faint.  You keep vomiting.  You have a  severe headache.  Your fever or chills gets worse. This information is not intended to replace advice given to you by your health care provider. Make sure you discuss any questions you have with your health care provider. Document Released: 09/10/2004 Document Revised: 02/26/2016 Document Reviewed: 01/22/2016 Elsevier Interactive Patient Education  2018 [employer].   Sinusitis, Adult Sinusitis is soreness and inflammation of your sinuses. Sinuses are hollow spaces in the bones around your face. Your sinuses are located:  Around your eyes.  In the middle of your forehead.  Behind your nose.  In your cheekbones.  Your sinuses and nasal passages are lined with a stringy fluid (mucus). Mucus normally drains out of your sinuses. When your nasal tissues become inflamed or swollen, the mucus can become trapped or blocked so air cannot flow through your sinuses. This allows bacteria, viruses, and funguses to grow, which leads to infection. Sinusitis can develop quickly and last for 7?10 days (acute) or for more than 12 weeks (chronic). Sinusitis often develops after a cold. What are the causes? This condition is caused by anything that creates swelling in the sinuses or stops mucus from draining, including:  Allergies.  Asthma.  Bacterial or viral infection.  Abnormally shaped bones between the nasal passages.  Nasal growths that contain mucus (nasal polyps).  Narrow sinus openings.  Pollutants, such as chemicals or irritants in the air.  A foreign object stuck in the nose.  A fungal infection. This is rare.  What increases the risk? The following factors may make you more likely to develop this condition:  Having allergies or asthma.  Having had a recent cold or respiratory tract infection.  Having structural deformities or blockages in your nose or sinuses.  Having a weak immune system.  Doing a lot of swimming or diving.  Overusing nasal  sprays.  Smoking.  What are the signs or symptoms? The main symptoms of this condition are pain and a feeling of pressure around the affected sinuses. Other symptoms include:  Upper toothache.  Earache.  Headache.  Bad breath.  Decreased sense of smell and taste.  A cough that may get worse at night.  Fatigue.  Fever.  Thick drainage from your nose. The drainage is often green and it may contain pus (purulent).  Stuffy nose or congestion.  Postnasal drip. This is when extra mucus collects in the throat or back of the nose.  Swelling and warmth over the affected sinuses.  Sore throat.  Sensitivity to light.  How is this diagnosed? This condition is diagnosed based on symptoms, a medical history, and a physical exam. To find out if your condition is acute or chronic, your health care provider may:  Look in your nose for signs of nasal polyps.  Tap over the affected sinus to check for signs of infection.  View the inside of your sinuses using an imaging device that has a light attached (endoscope).  If your health care provider suspects that you have chronic sinusitis, you may also:  Be tested for allergies.  Have a sample of mucus taken from your nose (nasal culture) and checked for bacteria.  Have a mucus sample examined to see if your sinusitis is related to an allergy.  If your sinusitis does not respond to treatment and it lasts longer than 8 weeks, you may have an MRI or CT scan to check your sinuses. These scans also help to determine how severe your infection is. In rare cases, a bone biopsy may be done to rule out more serious types of fungal sinus disease. How is this treated? Treatment for sinusitis depends on the cause and whether your condition is chronic or acute. If a virus is causing your sinusitis, your symptoms will go away on their own within 10 days. You may be given medicines to relieve your symptoms, including:  Topical nasal decongestants.  They shrink swollen nasal passages and let mucus drain from your sinuses.  Antihistamines. These drugs block inflammation that is triggered by allergies. This can help to ease swelling in your nose and sinuses.  Topical nasal corticosteroids. These are nasal sprays that ease inflammation and swelling in your nose and sinuses.  Nasal saline washes. These rinses can help to get rid of thick mucus in your nose.  If your condition is caused by bacteria, you will be given an antibiotic medicine. If your condition is caused by a fungus, you will be given an antifungal medicine. Surgery may be needed to correct underlying conditions, such as narrow nasal passages. Surgery may also be needed to remove polyps. Follow these instructions at home: Medicines  Take, use, or apply over-the-counter and prescription medicines only as told by your health care provider. These may include nasal sprays.  If you were prescribed an antibiotic medicine, take it as told by your health care provider. Do not stop taking the antibiotic even if you start to feel better. Hydrate and Humidify  Drink enough water to keep your urine clear or pale yellow. Staying hydrated will help to thin your mucus.  Use a cool mist humidifier to keep the humidity level in your home above 50%.  Inhale steam for 10-15 minutes, 3-4 times a day or as told by your health care provider. You can do this in the bathroom while a hot shower is running.  Limit your exposure to cool or dry air. Rest  Rest as much as possible.  Sleep with your head raised (elevated).  Make sure to get enough sleep each night. General instructions  Apply a warm, moist washcloth to your face 3-4 times a day or as told by your health care provider. This will help with discomfort.  Wash your hands often with soap and water to reduce your exposure to viruses and other germs. If soap and water are not available, use hand sanitizer.  Do not smoke. Avoid being  around people who are smoking (secondhand smoke).  Keep all follow-up visits as told by your health care provider. This is important. Contact a health care provider if:  You have a fever.  Your symptoms get worse.  Your symptoms do not improve within 10 days. Get help right away if:  You have a severe headache.  You have persistent vomiting.  You have pain or swelling around your face or eyes.  You have vision problems.  You develop confusion.  Your neck is stiff.  You have trouble breathing. This information is not intended to replace advice given to you by your health care provider. Make sure you discuss any questions you  have with your health care provider. Document Released: 08/03/2005 Document Revised: 03/29/2016 Document Reviewed: 05/29/2015 Elsevier Interactive Patient Education  2018 [employer].  Please use Augmentin and Prednisone as directed. Please use Flonase, Tessalon, and ProAir as needed. Increase water/rest/vit c-2,000mg /day. Alternate OTC Acetaminophen and Ibuprofen as needed for pain. If symptoms persist after Augmentin completed, please call clinic. FEEL BETTER!

## 2017-11-25 NOTE — Assessment & Plan Note (Signed)
Please use Augmentin and Prednisone as directed. Please use Flonase, Tessalon, and ProAir as needed. Increase water/rest/vit c-2,000mg /day. Alternate OTC Acetaminophen and Ibuprofen as needed for pain. If symptoms persist after Augmentin completed, please call clinic.

## 2017-11-25 NOTE — Progress Notes (Signed)
Subjective:    Patient ID: Brian Gay, male    DOB: 1966-03-15, 52 y.o.   MRN: [id number]  HPI: Mr. Brian Gay presents with bil ear pressure, copious thick/yellow/green nasal drainage, productive cough (thick yellow/white mucus), intermittent wheezing and fatigue.  He was treated L otitis media, bacterial rhinosinusitis, OS conjunctivitis on 11/09/17 with Amoxicillin 523m BID x 10 days. He reports completing course of medication as directed.  He has been using "every OTC thing I can get my hands on".  He denies smoking tobacco, however dips 1 can every 2 days.   He denies CP/dyspnea/fever/night sweats/N/V/D  Patient Care Team    Relationship Specialty Notifications Start End  OMellody Dance DO PCP - General Family Medicine  08/04/16   JCoolidge Breeze MD Consulting Physician Dentistry  12/11/15   KDaryll Brod MV[place]Physician Orthopedic Surgery  01/26/17     Patient Active Problem List   Diagnosis Date Noted  . Acute bronchitis 11/25/2017  . Acute maxillary sinusitis 11/25/2017  . Wheezing 11/25/2017  . Sinusitis 06/02/2017  . Stress and adjustment reaction 06/02/2017  . Low HDL (under 40) 01/26/2017  . Vitamin D deficiency 09/08/2016  . Tobacco abuse counseling 09/05/2016  . Elevated LDL cholesterol level 08/29/2016  . h/o Tobacco use disorder 08/04/2016  . Overweight (BMI 25.0-29.9) 08/04/2016  . Environmental and seasonal allergies 08/04/2016  . Fatigue- esp AM 08/04/2016  . Closed displaced fracture of middle phalanx of right ring finger 03/16/2016  . Volar plate injury of finger 03/16/2016  . Wellness examination 12/12/2015  . Essential hypertension 10/10/2015  . Allergic rhinitis 11/02/2012     Past Medical History:  Diagnosis Date  . Allergy   . Hypertension   . Kidney stones      Past Surgical History:  Procedure Laterality Date  . CLOSED REDUCTION FINGER WITH PERCUTANEOUS PINNING Right 03/19/2016   Procedure: CLOSED REDUCTION right ring  FINGER;   Surgeon: GDaryll Brod MD;  [place] [facility]  Service: Orthopedics;  Laterality: Right;  . cyst removed      pilonidal cyst  . WISDOM TOOTH EXTRACTION       Family History  Problem Relation Age of Onset  . Heart disease Mother   . Hypertension Mother   . Heart disease Father   . Cancer Father        prostate  . Prostate cancer Father   . Hyperlipidemia Father   . Stroke Father   . Heart disease Maternal Grandmother   . Heart disease Maternal Grandfather   . Heart disease Paternal Grandmother   . Heart disease Paternal Grandfather   . Colon cancer Neg Hx   . Colon polyps Neg Hx   . Rectal cancer Neg Hx   . Stomach cancer Neg Hx      Social History   Substance and Sexual Activity  Drug Use No     Social History   Substance and Sexual Activity  Alcohol Use Yes  . Alcohol/week: 0.0 oz   Comment: occasional     Social History   Tobacco Use  Smoking Status Former Smoker  . Packs/day: 0.50  . Years: 20.00  . Pack years: 10.00  . Last attempt to quit: 07/2016  . Years since quitting: 1.3  Smokeless Tobacco Current User  . Types: Chew     Outpatient Encounter Medications as of 11/25/2017  Medication Sig  . atorvastatin (LIPITOR) 40 MG tablet Take 1/2 tablet nightly at bedtime x 1 week then increase to  1 tablet nightly as tolerated  . Cetirizine HCl (ZYRTEC ALLERGY) 10 MG CAPS Daly take Allegra or Claritin or Zyrtec or Xyzal during the allergy season daily  . FLUoxetine (PROZAC) 20 MG tablet Take 2 tablets (40 mg total) by mouth daily.  . fluticasone (FLONASE) 50 MCG/ACT nasal spray Place 1 spray into both nostrils 2 (two) times daily.  . Hypertonic Nasal Wash (SINUS RINSE BOTTLE KIT) PACK Please do sinus rinses of your nasal passages twice daily followed by one spray Flonase each nostril.  Also, feel free to do a sinus rinse after any prolonged period of time outdoors  . losartan-hydrochlorothiazide (HYZAAR) 100-12.5 MG tablet Take 1 tablet by  mouth daily.  . [DISCONTINUED] fluticasone (FLONASE) 50 MCG/ACT nasal spray Place 1 spray into both nostrils 2 (two) times daily.  Marland Kitchen albuterol (PROVENTIL HFA;VENTOLIN HFA) 108 (90 Base) MCG/ACT inhaler Inhale 1-2 puffs into the lungs every 6 (six) hours as needed for wheezing or shortness of breath.  Marland Kitchen amoxicillin-clavulanate (AUGMENTIN) 875-125 MG tablet Take 1 tablet by mouth 2 (two) times daily.  . benzonatate (TESSALON) 200 MG capsule Take 1 capsule (200 mg total) by mouth 2 (two) times daily as needed for cough.  . predniSONE (DELTASONE) 20 MG tablet 1 tab every 12 hrs for 3 days.  1 tab daily for 3 days  . [DISCONTINUED] amoxicillin (AMOXIL) 500 MG capsule Take 2 capsules (1,000 mg total) by mouth 2 (two) times daily.  . [DISCONTINUED] predniSONE (DELTASONE) 20 MG tablet Take 3 tabs po * 2 days, then 2 tabs for 2 d, then 1 tab 2 d, then 1/2 tab 2 days.   No facility-administered encounter medications on file as of 11/25/2017.     Allergies: Codeine  Body mass index is 29.29 kg/m.  Blood pressure (!) 95/58, pulse 79, temperature 97.9 F (36.6 C), temperature source Oral, height 6' (1.829 m), weight 216 lb (98 kg), SpO2 97 %.         Review of Systems  Constitutional: Positive for fatigue. Negative for activity change, appetite change, chills, diaphoresis, fever and unexpected weight change.  HENT: Positive for congestion, ear pain, facial swelling, postnasal drip, rhinorrhea, sinus pressure, sneezing, sore throat and voice change. Negative for sinus pain.   Eyes: Negative for visual disturbance.  Respiratory: Positive for cough and wheezing. Negative for chest tightness, shortness of breath and stridor.   Cardiovascular: Negative for chest pain, palpitations and leg swelling.  Gastrointestinal: Negative for abdominal distention, abdominal pain, blood in stool, constipation, diarrhea, nausea and vomiting.  Endocrine: Negative for cold intolerance, heat intolerance, polydipsia,  polyphagia and polyuria.  Genitourinary: Negative for difficulty urinating and flank pain.  Neurological: Positive for headaches. Negative for dizziness.       Objective:   Physical Exam  Constitutional: He is oriented to person, place, and time. He appears well-developed and well-nourished. No distress.  HENT:  Head: Normocephalic and atraumatic.  Right Ear: External ear normal. Tympanic membrane is erythematous and bulging. No decreased hearing is noted.  Left Ear: External ear normal. Tympanic membrane is erythematous and bulging. No decreased hearing is noted.  Nose: Mucosal edema and rhinorrhea present. Right sinus exhibits maxillary sinus tenderness. Right sinus exhibits no frontal sinus tenderness. Left sinus exhibits maxillary sinus tenderness. Left sinus exhibits no frontal sinus tenderness.  Mouth/Throat: Uvula is midline and mucous membranes are normal. Posterior oropharyngeal edema and posterior oropharyngeal erythema present. No oropharyngeal exudate or tonsillar abscesses.  Eyes: Pupils are equal, round, and reactive to light. Conjunctivae  are normal.  Neck: Normal range of motion. Neck supple.  Cardiovascular: Normal rate, regular rhythm, normal heart sounds and intact distal pulses.  Pulmonary/Chest: He has no decreased breath sounds. He has wheezes in the right lower field and the left lower field. He has no rhonchi. He has no rales.  Lymphadenopathy:    He has no cervical adenopathy.  Neurological: He is alert and oriented to person, place, and time.  Skin: Skin is warm and dry. No rash noted. He is not diaphoretic. No erythema. No pallor.  Psychiatric: He has a normal mood and affect. His behavior is normal. Judgment and thought content normal.  Nursing note and vitals reviewed.     Assessment & Plan:   1. Acute bronchitis, unspecified organism   2. Acute maxillary sinusitis, recurrence not specified   3. Wheezing     Acute bronchitis Please use Augmentin and  Prednisone as directed. Please use Flonase, Tessalon, and ProAir as needed. Increase water/rest/vit c-2,069m/day. Alternate OTC Acetaminophen and Ibuprofen as needed for pain. If symptoms persist after Augmentin completed, please call clinic.  Acute maxillary sinusitis Please use Augmentin and Prednisone as directed. Please use Flonase, Tessalon, and ProAir as needed. Increase water/rest/vit c-2,0062mday. Alternate OTC Acetaminophen and Ibuprofen as needed for pain. If symptoms persist after Augmentin completed, please call clinic.    FOLLOW-UP:  Return if symptoms worsen or fail to improve.

## 2017-12-28 IMAGING — CR DG HAND COMPLETE 3+V*R*
3 series · 3 of 3 positions shown · non-contrast
Comparison: None.

CLINICAL DATA: Hyperextension of the fourth and fifth fingers,
bruising

EXAM:
RIGHT HAND - COMPLETE 3+ VIEW

[view not recorded (1 of 3)]
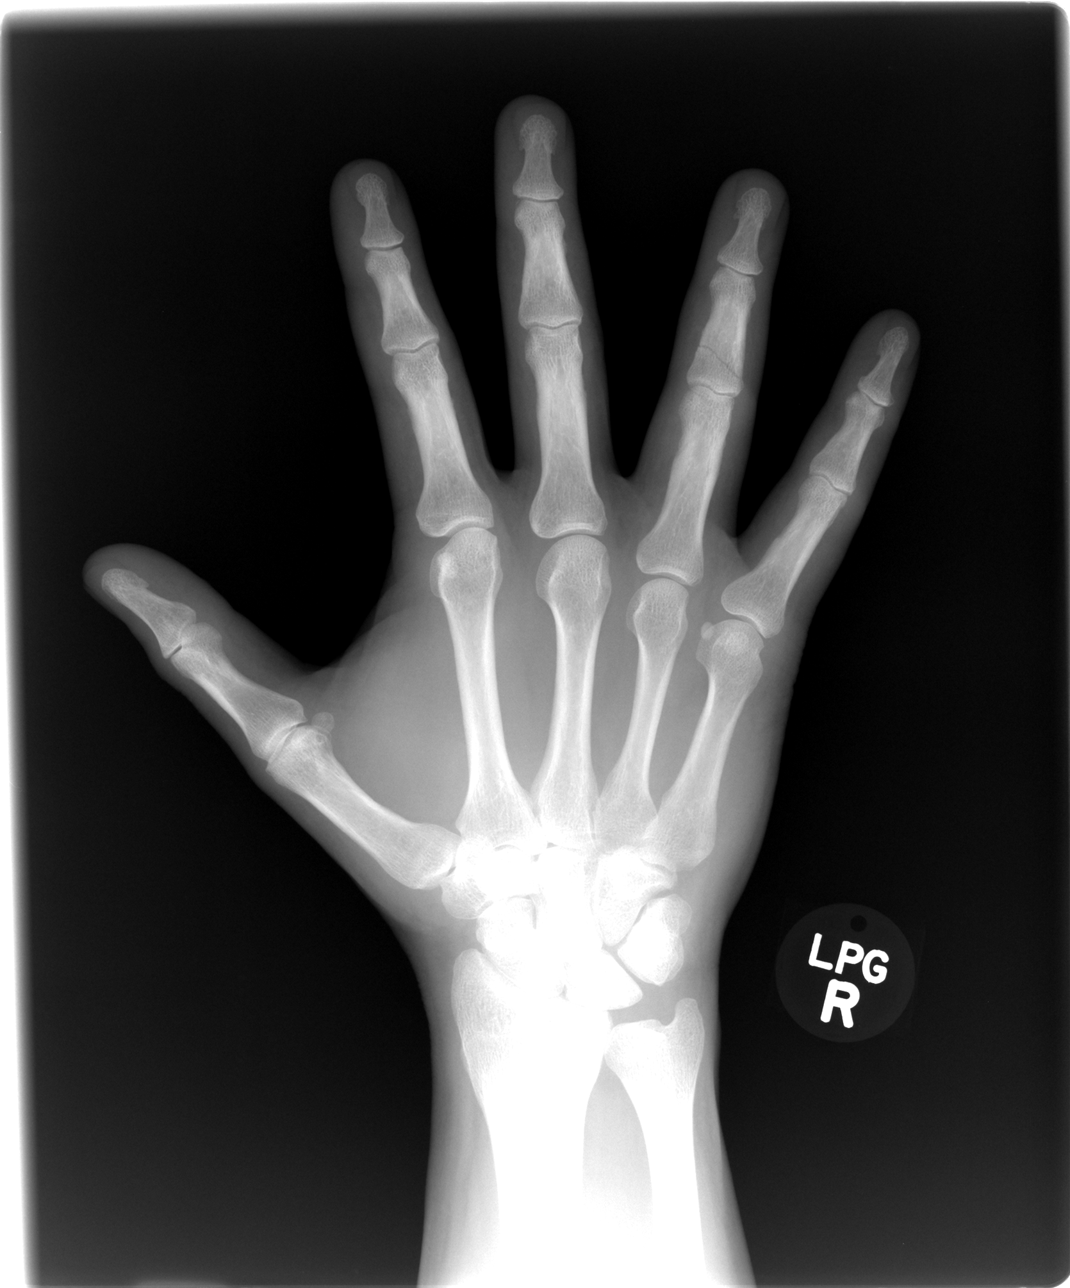

[view not recorded (2 of 3)]
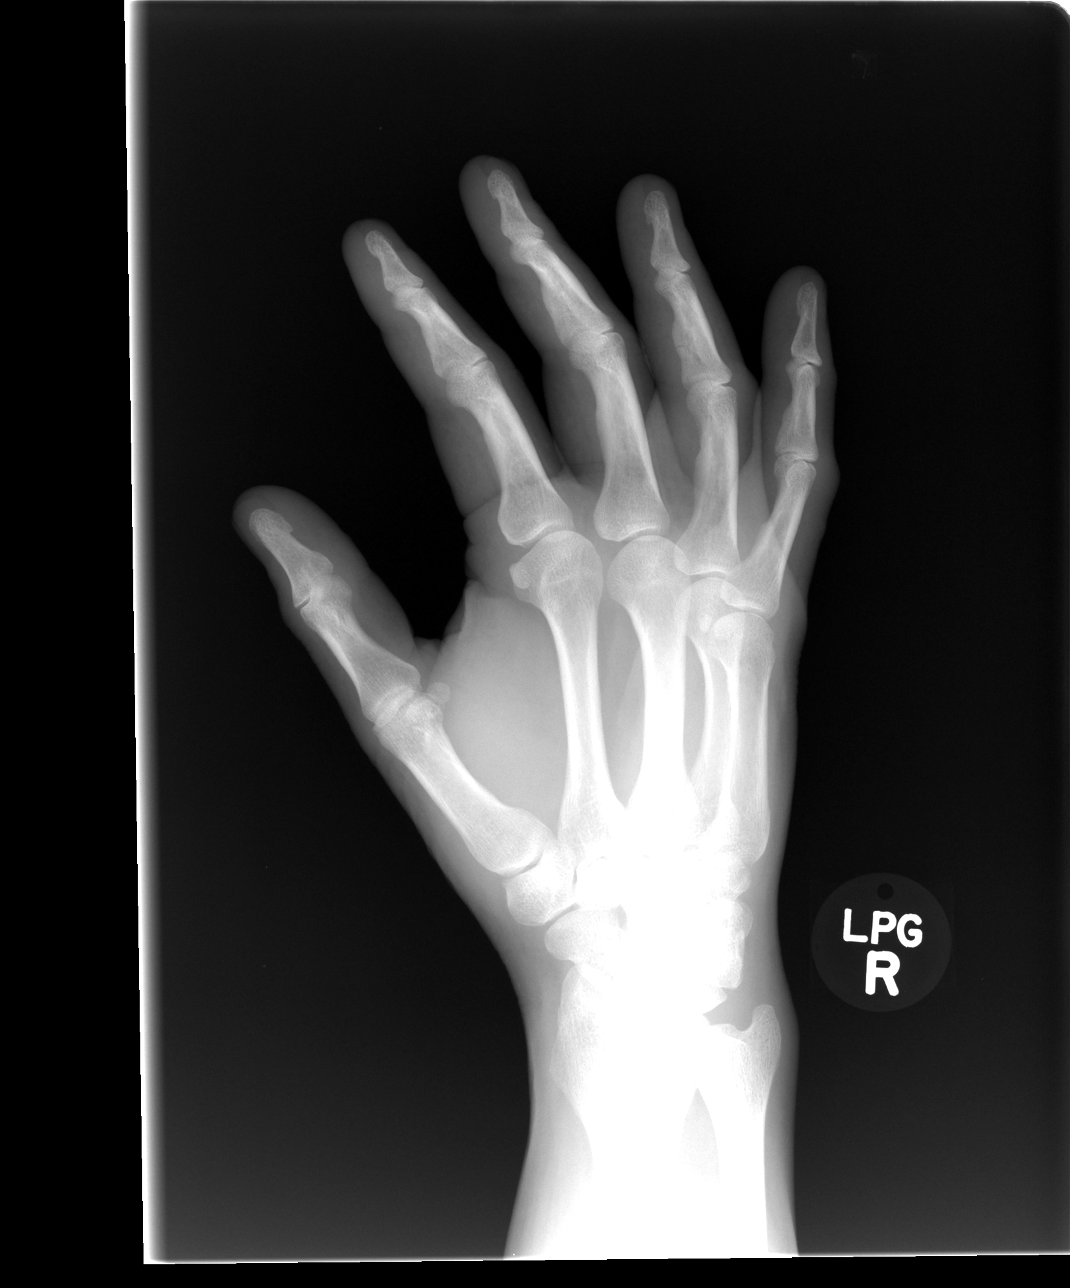

[view not recorded (3 of 3)]
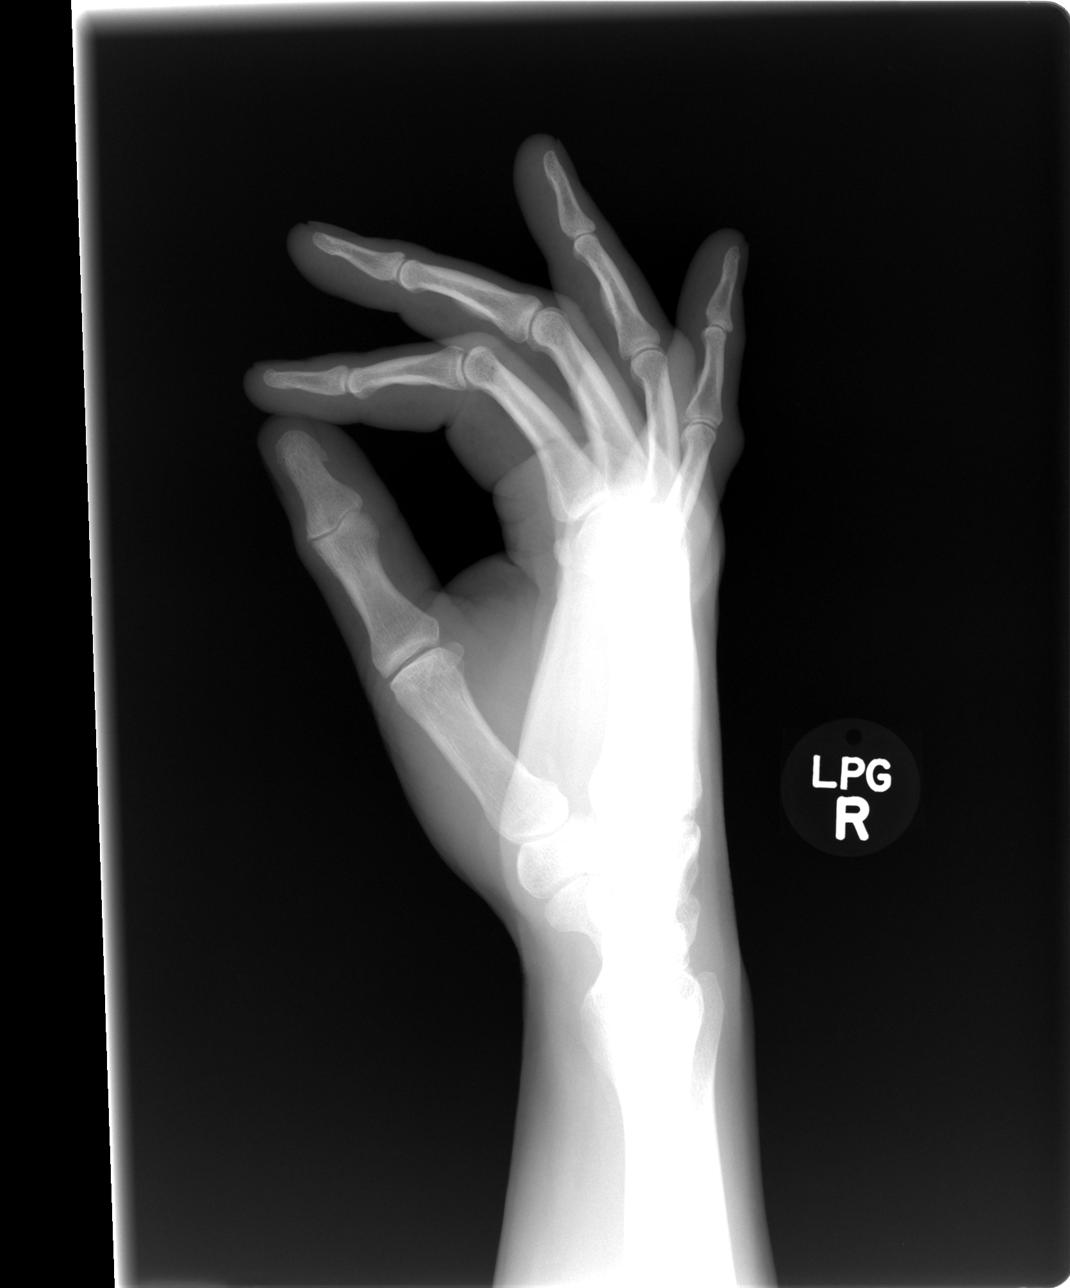

[3 of 3 positions shown; findings below may reference images not displayed]

FINDINGS: There is a nondisplaced linear fracture through the base of the
middle phalanx of the right fourth digit. Seen only on the lateral
view, there is a small avulsion fracture fragment from the base of
the middle phalanx of the right fifth digit with adjacent soft
tissue swelling. Joint spaces appear normal.
IMPRESSION: 1. Nondisplaced fracture through the base of the middle phalanx of
the right fourth digit.
2. Small avulsion fracture fragment from the base of the middle
phalanx of the right fifth digit.

## 2018-01-05 ENCOUNTER — Encounter: Payer: Self-pay | Admitting: Family Medicine

## 2018-01-05 ENCOUNTER — Ambulatory Visit: Payer: 59 | Admitting: Family Medicine

## 2018-01-05 VITALS — BP 111/72 | HR 70 | Ht 72.0 in | Wt 218.9 lb

## 2018-01-05 DIAGNOSIS — E786 Lipoprotein deficiency: Secondary | ICD-10-CM

## 2018-01-05 DIAGNOSIS — E7800 Pure hypercholesterolemia, unspecified: Secondary | ICD-10-CM | POA: Diagnosis not present

## 2018-01-05 DIAGNOSIS — J3089 Other allergic rhinitis: Secondary | ICD-10-CM

## 2018-01-05 DIAGNOSIS — I10 Essential (primary) hypertension: Secondary | ICD-10-CM

## 2018-01-05 DIAGNOSIS — F4329 Adjustment disorder with other symptoms: Secondary | ICD-10-CM | POA: Diagnosis not present

## 2018-01-05 MED ORDER — LOSARTAN POTASSIUM-HCTZ 100-12.5 MG PO TABS: 1.0000 | ORAL_TABLET | Freq: Every day | ORAL | 3 refills | Status: DC

## 2018-01-05 MED ORDER — SINUS RINSE BOTTLE KIT NA PACK: PACK | NASAL | Status: DC

## 2018-01-05 MED ORDER — ATORVASTATIN CALCIUM 40 MG PO TABS: ORAL_TABLET | ORAL | 3 refills | Status: DC

## 2018-01-05 MED ORDER — FLUOXETINE HCL 20 MG PO TABS: 20.0000 mg | ORAL_TABLET | Freq: Every day | ORAL | 3 refills | Status: DC

## 2018-01-05 NOTE — Progress Notes (Signed)
Impression and Recommendations:    1. Essential hypertension   2. Elevated LDL cholesterol level   3. Low HDL (under 40)   4. Environmental and seasonal allergies   5. Stress and adjustment reaction     1. Hypertension - Blood pressure well-controlled and stable at this time.  - Continue on Hyzaar as prescribed.  2. Cholesterol - Reviewed last lipid panel with the patient today.  Last taken 09/07/2017: LDL = 143 HDL = 38  - Patient has not been taking his cholesterol medication for 4-6+ months.  - Patient agrees to resume taking medication. - Take 40 mg tablet of Lipitor daily at night, prior to bedtime.  - Encouraged patient to minimize potential side-effects (joint pains, muscle aches) with adequate hydration.  - Re-check lipid panel in 4 months after resumed compliance with Lipitor.  3. Mood - Stable at this time, per patient. - Continue on Prozac one tablet daily.  - Patient notes that he cannot tolerate full dose of two tablets due to decreased sexual desire.  4. Strained Muscle in Left Forearm/Bicep - Advised patient to stop physical activities that may be straining his left arm.  - Recommended to ice the area of pain for 15-20 minutes, 3 or more times daily.  - Encouraged the patient to allow several weeks for his arm to heal.  5. BMI Counseling Explained to patient what BMI refers to, and what it means medically.    Told patient to think about it as a "medical risk stratification measurement" and how increasing BMI is associated with increasing risk/ or worsening state of various diseases such as hypertension, hyperlipidemia, diabetes, premature OA, depression etc.  American Heart Association guidelines for healthy diet, basically Mediterranean diet, and exercise guidelines of 30 minutes 5 days per week or more discussed in detail.  Health counseling performed.  All questions answered.  6. Exercise & Health Maintenance - Advised patient to continue  working toward designated exercise to improve health.    - Encouraged patient that exercise will help to improve his mood and sexual desire as well.  - Recommended that the patient eventually strive for at least 150 minutes of moderate cardiovascular activity per week according to guidelines established by the [facility].   - Healthy dietary habits encouraged, including low-carb, and high amounts of lean protein in diet.   - Patient should also consume adequate amounts of water - half of body weight in oz of water per day.  7. Follow-Up - Medications refilled today (Hyzaar, Lipitor).  - Patient will return in 4 months for cholesterol re-check.  - Patient knows to follow-up as scheduled for chronic care visits, and return as needed for acute concerns.   Education and routine counseling performed. Handouts provided.    Meds ordered this encounter  Medications  . atorvastatin (LIPITOR) 40 MG tablet    Sig: 1 tablet nightly before bedtime    Dispense:  90 tablet    Refill:  3  . losartan-hydrochlorothiazide (HYZAAR) 100-12.5 MG tablet    Sig: Take 1 tablet by mouth daily.    Dispense:  90 tablet    Refill:  3  . FLUoxetine (PROZAC) 20 MG tablet    Sig: Take 1 tablet (20 mg total) by mouth daily.    Dispense:  90 tablet    Refill:  3  . Hypertonic Nasal Wash (SINUS RINSE BOTTLE KIT) PACK    Sig: Please do sinus rinses of your nasal passages twice daily followed by  one spray Flonase each nostril.  Also, feel free to do a sinus rinse after any prolonged period of time outdoors     The patient was counseled, risk factors were discussed, anticipatory guidance given.  Gross side effects, risk and benefits, and alternatives of medications discussed with patient.  Patient is aware that all medications have potential side effects and we are unable to predict every side effect or drug-drug interaction that may occur.  Expresses verbal understanding and consents to current therapy plan and  treatment regimen.  Return in about 4 months (around 05/08/2018) for chol, bp, mood-follow-up 4 months-fasting blood work 2 to 3 days prior to OV.  Please see AVS handed out to patient at the end of our visit for further patient instructions/ counseling done pertaining to today's office visit.    Note: This document was prepared using Dragon voice recognition software and may include unintentional dictation errors.  This document serves as a record of services personally performed by [name], DO. It was created on her behalf by Toni Amend, a trained medical scribe. The creation of this record is based on the scribe's personal observations and the provider's statements to them.   I have reviewed the above medical documentation for accuracy and completeness and I concur.  [name] 01/07/18 12:42 PM    Subjective:    HPI: Brian Gay is a 52 y.o. male who presents to [place] [facility] today for follow up for HTN.    Patient notes that he needs to resume drinking appropriate amounts of water.  Mood Has been taking one Prozac tablet daily.  Cut back his dose from 2 tablets to 1 tablet. Notes that it is working fine at this dose and his mood is well controlled. Patient was concerned because the dose of two Prozac was causing lack of desire.  Patient notes that he is sleeping well.  Cholesterol Patient hasn't been taking Lipitor.  Notes "I can't get in the habit of taking it I guess." Believes that it's been longer than 4-6 months since he was last regularly on his cholesterol medicine. He was off of his meds in January when his cholesterol lipid panel was last checked.  Acute Pain in Left Forearm/Bicep Patient pulled his left arm a month ago.  Notes that the pain is more on the outer forearm/bicep area. The pain does not radiate; it stays in one place.    Notes in the mornings, the pain is worse, but eases with time, "kinda like it loosens  up." Activities such as lifting, exercise, and playing games like corn hole do aggravate the pain in his arm.  HTN:  -  His blood pressure has been controlled at home.  Has been checking his blood pressure at work and notes that it runs around 111/72, his measurement in the office today.  - Patient reports good compliance with blood pressure medications.  Is taking one Hyzaar daily.  - Denies medication S-E   - Smoking Status noted   - He denies new onset of: chest pain, exercise intolerance, shortness of breath, dizziness, visual changes, headache, lower extremity swelling or claudication.    Last 3 blood pressure readings in our office are as follows: BP Readings from Last 3 Encounters:  01/05/18 111/72  11/25/17 (!) 95/58  11/09/17 108/74    Pulse Readings from Last 3 Encounters:  01/05/18 70  11/25/17 79  11/09/17 73    Filed Weights   01/05/18 1349  Weight: 218 lb 14.4 oz (99.3 kg)    Depression screen [facility] 2/9 01/05/2018 11/25/2017 11/09/2017 09/07/2017 07/16/2017  Decreased Interest 0 0 0 0 0  Down, Depressed, Hopeless 0 0 0 0 0  PHQ - 2 Score 0 0 0 0 0  Altered sleeping 0 1 0 0 2  Tired, decreased energy 0 1 1 1 1   Change in appetite 0 0 0 0 0  Feeling bad or failure about yourself  0 0 0 0 0  Trouble concentrating 0 0 0 0 0  Moving slowly or fidgety/restless 0 0 0 0 0  Suicidal thoughts 0 0 0 0 0  PHQ-9 Score 0 2 1 1 3   Difficult doing work/chores Not difficult at all Not difficult at all Not difficult at all Not difficult at all Not difficult at all     Patient Care Team    Relationship Specialty Notifications Start End  [name], DO PCP - General Family Medicine  08/04/16   Coolidge Breeze, MD Consulting Physician Dentistry  12/11/15   Daryll Brod, MD Consulting Physician Orthopedic Surgery  01/26/17      Lab Results  Component Value Date   CREATININE 0.94 09/07/2017   BUN 13 09/07/2017   NA 140 09/07/2017   K 4.6 09/07/2017   CL 104  09/07/2017   CO2 20 09/07/2017    Lab Results  Component Value Date   CHOL 205 (H) 09/07/2017   CHOL 206 (H) 08/31/2016   CHOL 202 (H) 12/12/2015    Lab Results  Component Value Date   HDL 38 (L) 09/07/2017   HDL 35 (L) 08/31/2016   HDL 38.70 (L) 12/12/2015    Lab Results  Component Value Date   LDLCALC 143 (H) 09/07/2017   LDLCALC 153 (H) 08/31/2016   LDLCALC 150 (H) 12/12/2015    Lab Results  Component Value Date   TRIG 121 09/07/2017   TRIG 88 08/31/2016   TRIG 70.0 12/12/2015    Lab Results  Component Value Date   CHOLHDL 5.4 (H) 09/07/2017   CHOLHDL 5.9 (H) 08/31/2016   CHOLHDL 5 12/12/2015    No results found for: LDLDIRECT ===================================================================   Patient Active Problem List   Diagnosis Date Noted  . Elevated LDL cholesterol level 08/29/2016    Priority: High  . h/o Tobacco use disorder 08/04/2016    Priority: High  . Essential hypertension 10/10/2015    Priority: High  . Low HDL (under 40) 01/26/2017    Priority: Medium  . Tobacco abuse counseling 09/05/2016    Priority: Medium  . Overweight (BMI 25.0-29.9) 08/04/2016    Priority: Medium  . Environmental and seasonal allergies 08/04/2016    Priority: Medium  . Vitamin D deficiency 09/08/2016    Priority: Low  . Acute bronchitis 11/25/2017  . Acute maxillary sinusitis 11/25/2017  . Wheezing 11/25/2017  . Sinusitis 06/02/2017  . Stress and adjustment reaction 06/02/2017  . Fatigue- esp AM 08/04/2016  . Closed displaced fracture of middle phalanx of right ring finger 03/16/2016  . Volar plate injury of finger 03/16/2016  . Wellness examination 12/12/2015  . Allergic rhinitis 11/02/2012     Past Medical History:  Diagnosis Date  . Allergy   . Hypertension   . Kidney stones      Past Surgical History:  Procedure Laterality Date  . CLOSED REDUCTION FINGER WITH PERCUTANEOUS PINNING Right 03/19/2016   Procedure: CLOSED REDUCTION right ring   FINGER;  Surgeon: Daryll Brod, MD;  Location: [facility]  [place];  Service: Orthopedics;  Laterality: Right;  . cyst removed      pilonidal cyst  . WISDOM TOOTH EXTRACTION       Family History  Problem Relation Age of Onset  . Heart disease Mother   . Hypertension Mother   . Heart disease Father   . Cancer Father        prostate  . Prostate cancer Father   . Hyperlipidemia Father   . Stroke Father   . Heart disease Maternal Grandmother   . Heart disease Maternal Grandfather   . Heart disease Paternal Grandmother   . Heart disease Paternal Grandfather   . Colon cancer Neg Hx   . Colon polyps Neg Hx   . Rectal cancer Neg Hx   . Stomach cancer Neg Hx      Social History   Substance and Sexual Activity  Drug Use No  ,  Social History   Substance and Sexual Activity  Alcohol Use Yes  . Alcohol/week: 0.0 oz   Comment: occasional  ,  Social History   Tobacco Use  Smoking Status Former Smoker  . Packs/day: 0.50  . Years: 20.00  . Pack years: 10.00  . Last attempt to quit: 07/2016  . Years since quitting: 1.4  Smokeless Tobacco Current User  . Types: Chew  ,    Current Outpatient Medications on File Prior to Visit  Medication Sig Dispense Refill  . albuterol (PROVENTIL HFA;VENTOLIN HFA) 108 (90 Base) MCG/ACT inhaler Inhale 1-2 puffs into the lungs every 6 (six) hours as needed for wheezing or shortness of breath. 16 g 1   No current facility-administered medications on file prior to visit.      Allergies  Allergen Reactions  . Codeine Swelling     Review of Systems:   General:  Denies fever, chills Optho/Auditory:   Denies visual changes, blurred vision Respiratory:   Denies SOB, cough, wheeze, DIB  Cardiovascular:   Denies chest pain, palpitations, painful respirations Gastrointestinal:   Denies nausea, vomiting, diarrhea.  Endocrine:     Denies new hot or cold intolerance Musculoskeletal:  Denies joint swelling, gait issues, or new unexplained  myalgias/ arthralgias Skin:  Denies rash, suspicious lesions  Neurological:    Denies dizziness, unexplained weakness, numbness  Psychiatric/Behavioral:   Denies mood changes  Objective:    Blood pressure 111/72, pulse 70, height 6' (1.829 m), weight 218 lb 14.4 oz (99.3 kg), SpO2 98 %. Body mass index is 29.69 kg/m. General: Well Developed, well nourished, and in no acute distress.  HEENT: Normocephalic, atraumatic, pupils equal round reactive to light, neck supple, No carotid bruits, no JVD Skin: Warm and dry, cap RF less 2 sec Cardiac: Regular rate and rhythm, S1, S2 WNL's, no murmurs rubs or gallops Respiratory: ECTA B/L, Not using accessory muscles, speaking in full sentences. NeuroM-Sk: Ambulates w/o assistance, moves ext * 4 w/o difficulty, sensation grossly intact.  Ext: scant edema b/l lower ext Psych: No HI/SI, judgement and insight good, Euthymic mood. Full Affect.

## 2018-01-05 NOTE — Patient Instructions (Signed)

## 2018-03-07 ENCOUNTER — Other Ambulatory Visit: Payer: Self-pay

## 2018-03-07 ENCOUNTER — Ambulatory Visit: Payer: 59 | Admitting: Family Medicine

## 2018-03-07 ENCOUNTER — Encounter: Payer: Self-pay | Admitting: Family Medicine

## 2018-03-07 VITALS — BP 114/72 | HR 89 | Temp 98.7°F | Ht 72.0 in | Wt 219.4 lb

## 2018-03-07 DIAGNOSIS — L02215 Cutaneous abscess of perineum: Secondary | ICD-10-CM | POA: Diagnosis not present

## 2018-03-07 MED ORDER — HYDROCODONE-ACETAMINOPHEN 5-325 MG PO TABS: 1.0000 | ORAL_TABLET | Freq: Four times a day (QID) | ORAL | 0 refills | Status: DC | PRN

## 2018-03-07 MED ORDER — DOXYCYCLINE HYCLATE 100 MG PO TABS: 100.0000 mg | ORAL_TABLET | Freq: Two times a day (BID) | ORAL | 0 refills | Status: DC

## 2018-03-07 NOTE — Patient Instructions (Addendum)
IF you received an x-ray today, you will receive an invoice from [facility]. Please contact [facility] at [phone] with questions or concerns regarding your invoice.   IF you received labwork today, you will receive an invoice from [facility]. Please contact [facility] at [phone] with questions or concerns regarding your invoice.   Our billing staff will not be able to assist you with questions regarding bills from these companies.  You will be contacted with the lab results as soon as they are available. The fastest way to get your results is to activate your My Chart account. Instructions are located on the last page of this paperwork. If you have not heard from [place] regarding the results in 2 weeks, please contact this office.      Incision and Drainage, Care After Refer to this sheet in the next few weeks. These instructions provide you with information about caring for yourself after your procedure. Your health care provider may also give you more specific instructions. Your treatment has been planned according to current medical practices, but problems sometimes occur. Call your health care provider if you have any problems or questions after your procedure. What can I expect after the procedure? After the procedure, it is common to have:  Pain or discomfort around your incision site.  Drainage from your incision.  Follow these instructions at home:  Take over-the-counter and prescription medicines only as told by your health care provider.  If you were prescribed an antibiotic medicine, take it as told by your health care provider.Do not stop taking the antibiotic even if you start to feel better.  Followinstructions from your health care provider about: ? How to take care of your incision. ? When and how you should change your packing and bandage (dressing). Wash your hands with soap and water before you change your dressing. If soap and water are  not available, use hand sanitizer. ? When you should remove your dressing.  Do not take baths, swim, or use a hot tub until your health care provider approves.  Keep all follow-up visits as told by your health care provider. This is important.  Check your incision area every day for signs of infection. Check for: ? More redness, swelling, or pain. ? More fluid or blood. ? Warmth. ? Pus or a bad smell. Contact a health care provider if:  Your cyst or abscess returns.  You have a fever.  You have more redness, swelling, or pain around your incision.  You have more fluid or blood coming from your incision.  Your incision feels warm to the touch.  You have pus or a bad smell coming from your incision. Get help right away if:  You have severe pain or bleeding.  You cannot eat or drink without vomiting.  You have decreased urine output.  You become short of breath.  You have chest pain.  You cough up blood.  The area where the incision and drainage occurred becomes numb or it tingles. This information is not intended to replace advice given to you by your health care provider. Make sure you discuss any questions you have with your health care provider. Document Released: 10/26/2011 Document Revised: 01/03/2016 Document Reviewed: 05/24/2015 Elsevier Interactive Patient Education  2018 [place].  Incision and Drainage Incision and drainage is a surgical procedure to open and drain a fluid-filled sac. The sac may be filled with pus, mucus, or blood. Examples of fluid-filled sacs that may need surgical drainage  include cysts, skin infections (abscesses), and red lumps that develop from a ruptured cyst or a small abscess (boils). You may need this procedure if the affected area is large, painful, infected, or not healing well. Tell a health care provider about:  Any allergies you have.  All medicines you are taking, including vitamins, herbs, eye drops, creams, and  over-the-counter medicines.  Any problems you or family members have had with anesthetic medicines.  Any blood disorders you have.  Any surgeries you have had.  Any medical conditions you have.  Whether you are pregnant or may be pregnant. What are the risks? Generally, this is a safe procedure. However, problems may occur, including:  Infection.  Bleeding.  Allergic reactions to medicines.  Scarring.  What happens before the procedure?  You may need an ultrasound or other imaging tests to see how large or deep the fluid-filled sac is.  You may have blood tests to check for infection.  You may get a tetanus shot.  You may be given antibiotic medicine to help prevent infection.  Follow instructions from your health care provider about eating or drinking restrictions.  Ask your health care provider about: ? Changing or stopping your regular medicines. This is especially important if you are taking diabetes medicines or blood thinners. ? Taking medicines such as aspirin and ibuprofen. These medicines can thin your blood. Do not take these medicines before your procedure if your health care provider instructs you not to.  Plan to have someone take you home after the procedure.  If you will be going home right after the procedure, plan to have someone stay with you for 24 hours. What happens during the procedure?  To reduce your risk of infection: ? Your health care team will wash or sanitize their hands. ? Your skin will be washed with soap.  You will be given one or more of the following: ? A medicine to help you relax (sedative). ? A medicine to numb the area (local anesthetic). ? A medicine to make you fall asleep (general anesthetic).  An incision will be made in the top of the fluid-filled sac.  The contents of the sac may be squeezed out, or a syringe or tube (catheter)may be used to empty the sac.  The catheter may be left in place for several weeks to  drain any fluid. Or, your health care provider may stitch open the edges of the incision to make a long-term opening for drainage (marsupialization).  The inside of the sac may be washed out (irrigated) with a sterile solution and packed with gauze before it is covered with a bandage (dressing). The procedure may vary among health care providers and hospitals. What happens after the procedure?  Your blood pressure, heart rate, breathing rate, and blood oxygen level will be monitored often until the medicines you were given have worn off.  Do not drive for 24 hours if you received a sedative. This information is not intended to replace advice given to you by your health care provider. Make sure you discuss any questions you have with your health care provider. Document Released: 01/27/2001 Document Revised: 01/09/2016 Document Reviewed: 05/24/2015 Elsevier Interactive Patient Education  2018 [place].  Perianal Abscess An abscess is an infected area that is filled with pus. A perianal abscess occurs in the perineum, which is the area between the anus and the scrotum in males and between the anus and the vagina in females. Perianal abscesses can vary in size. Without  treatment, a perianal abscess can become larger and cause other problems. What are the causes? This condition is caused by:  Waste from damaged or dead tissue (debris) that plugs up glands in the perineum. When this happens, an abscess may form.  Infections of the perineum.  What are the signs or symptoms? Common symptoms of this condition include:  Swelling and redness in the area of the abscess. The redness may go beyond the abscess and appear as a red streak on the skin.  Pain in the area of the abscess, including pain when sitting, walking, or passing stool.  Other possible symptoms include:  A visible, painful lump, or a lump that can be felt when touched.  Bleeding or pus-like discharge from the  area.  Fever.  General weakness.  How is this diagnosed? This condition is diagnosed based on your medical history and a physical exam of the affected area.  This may involve examining the rectal area with a gloved hand (digital rectal exam).  Sometimes, the health care provider needs to look into the rectum using a probe or a scope.  For women, it may require a careful vaginal exam.  How is this treated? Treatment for this condition may include:  Making a cut (incision) in the abscess to drain the pus. This can sometimes be done in your health care provider's office or an emergency department after you are given medicine to numb the area (local anesthetic).  Surgery to drain the abscess. This is for larger or deeper abscesses.  Antibiotic medicines, if there is infection in the surrounding tissue (cellulitis).  Having gauze packed into the abscess to continue draining the area.  Frequent baths in warm water that is deep enough to cover your hips and buttocks (sitz baths). These help the wound heal and they make the abscess less likely to come back.  Follow these instructions at home: Medicines  Take over-the-counter and prescription medicines for pain, fever, or discomfort only as told by your health care provider.  If you were prescribed an antibiotic medicine, use it as told by your health care provider. Do not stop using the antibiotic even if you start to feel better.  Do not drive or use heavy machinery while taking prescription pain medicine. Wound care   Keep the skin around the wound clean and dry. Avoid cleaning the area too much.  Avoid scratching the wound.  Avoid using colored or perfumed toilet papers.  Take a sitz bath 3-4 times a day and after bowel movements. This will help reduce pain and swelling.  If directed, apply ice to the injured area: ? Put ice in a plastic bag. ? Place a towel between your skin and the bag. ? Leave the ice on for 20  minutes, 2-3 times a day.  Check your incision area every day for signs of infection. Check for: ? More redness, swelling, or pain. ? More fluid or blood. ? Warmth. ? Pus or a bad smell. Gauze  If gauze was used in the abscess, follow instructions from your health care provider about removing or changing the gauze. It can usually be removed in 2-3 days.  Wash your hands with soap and water before you remove or change your gauze. If soap and water are not available, use hand sanitizer.  If one or more drains were placed in the abscess cavity, be careful not to pull at them. Your health care provider will tell you how long they need to remain in place.  General instructions  Keep all follow-up visits as told by your health care provider. This is important. Contact a health care provider if:  You have trouble passing stool or passing urine.  Your pain or swelling in the affected area does not seem to be getting better.  The gauze packing or the drains come out before the planned time. Get help right away if:  You have problems moving or using your legs.  You have severe or increasing pain.  Your swelling in the affected area suddenly gets worse.  You have a large increase in bleeding or passing of pus.  You have chills or a fever. This information is not intended to replace advice given to you by your health care provider. Make sure you discuss any questions you have with your health care provider. Document Released: 09/09/2006 Document Revised: 02/21/2016 Document Reviewed: 01/13/2016 Elsevier Interactive Patient Education  [employer].

## 2018-03-07 NOTE — Progress Notes (Addendum)
Subjective:  By signing my name below, I, Brian Gay, attest that this documentation has been prepared under the direction and in the presence of Brian Cheadle, MD. Electronically Signed: [name], [place]. 03/07/2018 , 10:37 AM .  Patient was seen in Room 3 .   Patient ID: Brian Gay, male    DOB: 1965-09-21, 52 y.o.   MRN: [id number] Chief Complaint  Patient presents with  . Cyst    Pt here today concern about a cyst in the middle of his buttock that has been there for a week, but this morning pt states it has been oozing out pus   HPI Brian Gay is a 52 y.o. male who presents to Primary Care at [facility] complaining of a cyst that started about over a week ago. Patient reports he's had a boil prior in the same location about 20-30 years ago; with past experiences, he knew we wouldn't be able to lance it until it had gotten larger. He waited for today to come in, and the area did drain by itself last night, so significantly smaller than it was. However, the area had sealed back over, yet it doesn't feel fully drained and area is still very tender. He denies any systemic symptoms; history of seasonal allergies and HTN.   He states that being injected with lidocaine at his prior visit was the worst pain he's ever experienced and would elect to go without anesthesia today. But, he agrees to try topical lidocaine 30 minutes prior to laceration.   Past Medical History:  Diagnosis Date  . Allergy   . Hypertension   . Kidney stones    Past Surgical History:  Procedure Laterality Date  . CLOSED REDUCTION FINGER WITH PERCUTANEOUS PINNING Right 03/19/2016   Procedure: CLOSED REDUCTION right ring  FINGER;  Surgeon: Daryll Brod, MD;  Location: [facility];  Service: Orthopedics;  Laterality: Right;  . cyst removed      pilonidal cyst  . WISDOM TOOTH EXTRACTION     Prior to Admission medications   Medication Sig Start Date End Date Taking? Authorizing Provider  albuterol  (PROVENTIL HFA;VENTOLIN HFA) 108 (90 Base) MCG/ACT inhaler Inhale 1-2 puffs into the lungs every 6 (six) hours as needed for wheezing or shortness of breath. 11/25/17  Yes Danford, Valetta Fuller D, NP  atorvastatin (LIPITOR) 40 MG tablet 1 tablet nightly before bedtime 01/05/18  Yes Opalski, Deborah, DO  FLUoxetine (PROZAC) 20 MG tablet Take 1 tablet (20 mg total) by mouth daily. 01/05/18  Yes Opalski, Neoma Laming, DO  Hypertonic Nasal Wash (SINUS RINSE BOTTLE KIT) PACK Please do sinus rinses of your nasal passages twice daily followed by one spray Flonase each nostril.  Also, feel free to do a sinus rinse after any prolonged period of time outdoors 01/05/18  Yes Opalski, Neoma Laming, DO  losartan-hydrochlorothiazide (HYZAAR) 100-12.5 MG tablet Take 1 tablet by mouth daily. 01/05/18  Yes Opalski, Neoma Laming, DO   Allergies  Allergen Reactions  . Codeine Swelling   Family History  Problem Relation Age of Onset  . Heart disease Mother   . Hypertension Mother   . Heart disease Father   . Cancer Father        prostate  . Prostate cancer Father   . Hyperlipidemia Father   . Stroke Father   . Heart disease Maternal Grandmother   . Heart disease Maternal Grandfather   . Heart disease Paternal Grandmother   . Heart disease Paternal Grandfather   . Colon cancer Neg Hx   .  Colon polyps Neg Hx   . Rectal cancer Neg Hx   . Stomach cancer Neg Hx    Social History   Socioeconomic History  . Marital status: Married    Spouse name: Tammy  . Number of children: 1  . Years of education: 48  . Highest education level: Not on file  Occupational History  . 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 Financial resource strain: Not on file  . Food insecurity:    Worry: Not on file    Inability: Not on file  . Transportation needs:    Medical: Not on file    Non-medical: Not on file  Tobacco Use  . Smoking status: Former Smoker    Packs/day: 0.50    Years: 20.00     Pack years: 10.00    Last attempt to quit: 07/2016    Years since quitting: 1.6  . Smokeless tobacco: Current User    Types: Chew  Substance and Sexual Activity  . Alcohol use: Yes    Alcohol/week: 0.0 oz    Comment: occasional  . Drug use: No  . Sexual activity: Yes    Partners: Female  Lifestyle  . Physical activity:    Days per week: Not on file    Minutes per session: Not on file  . Stress: Not on file  Relationships  . Social connections:    Talks on phone: Not on file    Gets together: Not on file    Attends religious service: Not on file    Active member of club or organization: Not on file    Attends meetings of clubs or organizations: Not on file    Relationship status: Not on file  Other Topics Concern  . Not on file  Social History Narrative   Married since 1984.   Depression screen [facility] 2/9 01/05/2018 11/25/2017 11/09/2017 09/07/2017 07/16/2017  Decreased Interest 0 0 0 0 0  Down, Depressed, Hopeless 0 0 0 0 0  PHQ - 2 Score 0 0 0 0 0  Altered sleeping 0 1 0 0 2  Tired, decreased energy 0 _0 Change in appetite 0 0 0 0 0  Feeling bad or failure about yourself  0 0 0 0 0  Trouble concentrating 0 0 0 0 0  Moving slowly or fidgety/restless 0 0 0 0 0  Suicidal thoughts 0 0 0 0 0  PHQ-9 Score 0 _1 Difficult doing work/chores Not difficult at all Not difficult at all Not difficult at all Not difficult at all Not difficult at all    Review of Systems  Constitutional: Negative for fatigue and unexpected weight change.  Eyes: Negative for visual disturbance.  Respiratory: Negative for cough, chest tightness and shortness of breath.   Cardiovascular: Negative for chest pain, palpitations and leg swelling.  Gastrointestinal: Negative for abdominal pain and Gay in stool.  Genitourinary:       Perineal cyst  Neurological: Negative for dizziness, light-headedness and headaches.       Objective:   Physical Exam  Constitutional: He is oriented to person,  place, and time. He appears well-developed and well-nourished. No distress.  HENT:  Head: Normocephalic and atraumatic.  Eyes: Pupils are equal, round, and reactive to light. EOM are normal.  Neck: Neck supple.  Cardiovascular: Normal rate.  Pulmonary/Chest: Effort normal. No respiratory distress.  Genitourinary:  Genitourinary Comments: Cyst located at the  right aspect perineum behind posterior to his scrotum  Musculoskeletal: Normal range of motion.  Neurological: He is alert and oriented to person, place, and time.  Skin: Skin is warm and dry.  Psychiatric: He has a normal mood and affect. His behavior is normal.  Nursing note and vitals reviewed.   BP 114/72 (BP Location: Right Arm, Patient Position: Sitting, Cuff Size: Large)   Pulse 89   Temp 98.7 F (37.1 C) (Oral)   Ht 6' (1.829 m)   Wt 219 lb 6.4 oz (99.5 kg)   SpO2 96%   BMI 29.76 kg/m     Verbal consent contained after review of risks/benefits of procedures.  Area of fluctuance on right gluteal aspect of perineum cleaned with betadine x 2.  Anesthesia with approx 5cc of subcutaneous 1% lidocaine with epi with good result.  1/2 cm incision made into center of abscess w/ 11 blade and expressed purulent drainage followed by copious amount of sanguineous drainage. Abscess explored with hemostats and no loculations found. Packed with approx 3 in of iodoform 1/4cm packing gauze and dressed.  Pt tolerated procedure well.  Assessment & Plan:   1. Abscess of perineum     Orders Placed This Encounter  Procedures  . WOUND CULTURE    Order Specific Question:   Source    Answer:   right perineum abscess    Meds ordered this encounter  Medications  . doxycycline (VIBRA-TABS) 100 MG tablet    Sig: Take 1 tablet (100 mg total) by mouth 2 (two) times daily.    Dispense:  20 tablet    Refill:  0  . HYDROcodone-acetaminophen (NORCO/VICODIN) 5-325 MG tablet    Sig: Take 1 tablet by mouth every 6 (six) hours as needed for  moderate pain. Take 1-2 tabs 30 min prior to appointment for dressing change    Dispense:  9 tablet    Refill:  0    Brian Cheadle, MD, MPH Primary Care at [place], [place]  [id number] [phone] Office phone  [phone] Office fax   06/05/18 2:06 AM

## 2018-03-09 ENCOUNTER — Other Ambulatory Visit: Payer: Self-pay

## 2018-03-09 ENCOUNTER — Encounter: Payer: Self-pay | Admitting: Physician Assistant

## 2018-03-09 ENCOUNTER — Ambulatory Visit: Payer: 59 | Admitting: Physician Assistant

## 2018-03-09 VITALS — BP 116/75 | HR 81 | Temp 98.9°F | Resp 16 | Ht 71.0 in | Wt 216.0 lb

## 2018-03-09 DIAGNOSIS — L02215 Cutaneous abscess of perineum: Secondary | ICD-10-CM

## 2018-03-09 LAB — WOUND CULTURE

## 2018-03-09 NOTE — Progress Notes (Signed)
Stefan Markarian  MRN: [id number] DOB: 1966-07-02  PCP: [name], DO  Subjective:  Pt presents to clinic for follow-up abscess of his perineum.  He was here on 7/22 and saw Dr. Brigitte Pulse and received an I&D with packing.  He says his packing is still in.  He is taking doxycycline as prescribed.  He has taken 2 pain pills since his last appointment.  He is feeling better today with improvement.  He says a lot drained from his wound yesterday.  He is not using warm compresses. ROS below  Review of Systems  Constitutional: Negative for chills and fever.  Skin: Positive for wound.    Patient Active Problem List   Diagnosis Date Noted  . Acute bronchitis 11/25/2017  . Acute maxillary sinusitis 11/25/2017  . Wheezing 11/25/2017  . Sinusitis 06/02/2017  . Stress and adjustment reaction 06/02/2017  . Low HDL (under 40) 01/26/2017  . Vitamin D deficiency 09/08/2016  . Tobacco abuse counseling 09/05/2016  . Elevated LDL cholesterol level 08/29/2016  . h/o Tobacco use disorder 08/04/2016  . Overweight (BMI 25.0-29.9) 08/04/2016  . Environmental and seasonal allergies 08/04/2016  . Fatigue- esp AM 08/04/2016  . Closed displaced fracture of middle phalanx of right ring finger 03/16/2016  . Volar plate injury of finger 03/16/2016  . Wellness examination 12/12/2015  . Essential hypertension 10/10/2015  . Allergic rhinitis 11/02/2012    Current Outpatient Medications on File Prior to Visit  Medication Sig Dispense Refill  . atorvastatin (LIPITOR) 40 MG tablet 1 tablet nightly before bedtime 90 tablet 3  . doxycycline (VIBRA-TABS) 100 MG tablet Take 1 tablet (100 mg total) by mouth 2 (two) times daily. 20 tablet 0  . FLUoxetine (PROZAC) 20 MG tablet Take 1 tablet (20 mg total) by mouth daily. 90 tablet 3  . HYDROcodone-acetaminophen (NORCO/VICODIN) 5-325 MG tablet Take 1 tablet by mouth every 6 (six) hours as needed for moderate pain. Take 1-2 tabs 30 min prior to appointment for dressing  change 9 tablet 0  . losartan-hydrochlorothiazide (HYZAAR) 100-12.5 MG tablet Take 1 tablet by mouth daily. 90 tablet 3  . albuterol (PROVENTIL HFA;VENTOLIN HFA) 108 (90 Base) MCG/ACT inhaler Inhale 1-2 puffs into the lungs every 6 (six) hours as needed for wheezing or shortness of breath. (Patient not taking: Reported on 03/09/2018) 16 g 1  . Hypertonic Nasal Wash (SINUS RINSE BOTTLE KIT) PACK Please do sinus rinses of your nasal passages twice daily followed by one spray Flonase each nostril.  Also, feel free to do a sinus rinse after any prolonged period of time outdoors (Patient not taking: Reported on 03/09/2018)     No current facility-administered medications on file prior to visit.     Allergies  Allergen Reactions  . Codeine Swelling     Objective:  BP 116/75 (BP Location: Right Arm, Patient Position: Sitting, Cuff Size: Normal)   Pulse 81   Temp 98.9 F (37.2 C) (Oral)   Resp 16   Ht 5' 11"  (1.803 m)   Wt 216 lb (98 kg)   SpO2 97%   BMI 30.13 kg/m   Physical Exam  Constitutional: He is oriented to person, place, and time. He appears well-developed and well-nourished.  Genitourinary:     Neurological: He is alert and oriented to person, place, and time.  Skin: Skin is warm and dry.  Psychiatric: He has a normal mood and affect. His behavior is normal. Judgment and thought content normal.  Vitals reviewed.   Assessment and Plan :  1. Abscess of perineum - Patient presents for follow-up abscess drainage.  This is his second office visit for this problem.  He endorses improvement, is still taking doxycycline as prescribed.  His pain is controlled.  Abscess repacked.  Return to clinic in 2 days for reassessment    Mercer Pod, PA-C  Primary Care at [place] 03/09/2018 8:38 AM

## 2018-03-09 NOTE — Patient Instructions (Addendum)
  Change dressing if dressing is soiled. Keep covered at all times except when showering. You may get wet in the shower but do not scrub.  Apply warm compresses/heating pad to facilitate drainage. Leave packing in place. Do not apply any ointments as this may delay healing. If an antibiotic was prescribed, take as directed until finished. Return in 2 days for wound care.   Thank you for coming in today. I hope you feel we met your needs.  Feel free to call PCP if you have any questions or further requests.  Please consider signing up for MyChart if you do not already have it, as this is a great way to communicate with me.  Best,  Whitney McVey, PA-C   IF you received an x-ray today, you will receive an invoice from [facility]. Please contact [facility] at [phone] with questions or concerns regarding your invoice.   IF you received labwork today, you will receive an invoice from [facility]. Please contact [facility] at [phone] with questions or concerns regarding your invoice.   Our billing staff will not be able to assist you with questions regarding bills from these companies.  You will be contacted with the lab results as soon as they are available. The fastest way to get your results is to activate your My Chart account. Instructions are located on the last page of this paperwork. If you have not heard from [place] regarding the results in 2 weeks, please contact this office.

## 2018-03-11 ENCOUNTER — Ambulatory Visit (INDEPENDENT_AMBULATORY_CARE_PROVIDER_SITE_OTHER): Payer: 59 | Admitting: Physician Assistant

## 2018-03-11 ENCOUNTER — Other Ambulatory Visit: Payer: Self-pay

## 2018-03-11 ENCOUNTER — Encounter: Payer: Self-pay | Admitting: Physician Assistant

## 2018-03-11 VITALS — BP 118/78 | HR 74 | Temp 98.3°F | Resp 16 | Ht 71.0 in | Wt 214.0 lb

## 2018-03-11 DIAGNOSIS — L02215 Cutaneous abscess of perineum: Secondary | ICD-10-CM

## 2018-03-11 NOTE — Progress Notes (Signed)
Brian Gay  MRN: [id number] DOB: [birth year]  PCP: Brian Dance, DO  Subjective:  Pt is a 52 year old male who presents to clinic for f/u abscess of perineum.  He is feeling well today.  This is his third office visit for this problem. Initial visit for this problem was on 7/22 and received an I&D with packing.  He was here 2 days ago, wound was repacked and dressed.  He says the packing came out sometime last night.  He believes the area is getting smaller.  He denies any drainage, fever, chills.   Review of Systems  Constitutional: Negative for chills and fever.  Skin: Positive for wound.    Patient Active Problem List   Diagnosis Date Noted  . Acute bronchitis 11/25/2017  . Acute maxillary sinusitis 11/25/2017  . Wheezing 11/25/2017  . Sinusitis 06/02/2017  . Stress and adjustment reaction 06/02/2017  . Low HDL (under 40) 01/26/2017  . Vitamin D deficiency 09/08/2016  . Tobacco abuse counseling 09/05/2016  . Elevated LDL cholesterol level 08/29/2016  . h/o Tobacco use disorder 08/04/2016  . Overweight (BMI 25.0-29.9) 08/04/2016  . Environmental and seasonal allergies 08/04/2016  . Fatigue- esp AM 08/04/2016  . Closed displaced fracture of middle phalanx of right ring finger 03/16/2016  . Volar plate injury of finger 03/16/2016  . Wellness examination 12/12/2015  . Essential hypertension 10/10/2015  . Allergic rhinitis 11/02/2012    Current Outpatient Medications on File Prior to Visit  Medication Sig Dispense Refill  . atorvastatin (LIPITOR) 40 MG tablet 1 tablet nightly before bedtime 90 tablet 3  . doxycycline (VIBRA-TABS) 100 MG tablet Take 1 tablet (100 mg total) by mouth 2 (two) times daily. 20 tablet 0  . FLUoxetine (PROZAC) 20 MG tablet Take 1 tablet (20 mg total) by mouth daily. 90 tablet 3  . HYDROcodone-acetaminophen (NORCO/VICODIN) 5-325 MG tablet Take 1 tablet by mouth every 6 (six) hours as needed for moderate pain. Take 1-2 tabs 30 min prior to  appointment for dressing change 9 tablet 0  . losartan-hydrochlorothiazide (HYZAAR) 100-12.5 MG tablet Take 1 tablet by mouth daily. 90 tablet 3  . albuterol (PROVENTIL HFA;VENTOLIN HFA) 108 (90 Base) MCG/ACT inhaler Inhale 1-2 puffs into the lungs every 6 (six) hours as needed for wheezing or shortness of breath. (Patient not taking: Reported on 03/09/2018) 16 g 1  . Hypertonic Nasal Wash (SINUS RINSE BOTTLE KIT) PACK Please do sinus rinses of your nasal passages twice daily followed by one spray Flonase each nostril.  Also, feel free to do a sinus rinse after any prolonged period of time outdoors (Patient not taking: Reported on 03/09/2018)     No current facility-administered medications on file prior to visit.     Allergies  Allergen Reactions  . Codeine Swelling     Objective:  BP 118/78 (BP Location: Left Arm, Patient Position: Sitting, Cuff Size: Normal)   Pulse 74   Temp 98.3 F (36.8 C) (Oral)   Resp 16   Ht _0  (1.803 m)   Wt 214 lb (97.1 kg)   SpO2 96%   BMI 29.85 kg/m   Physical Exam  Constitutional: He is oriented to person, place, and time. He appears well-developed and well-nourished.  Genitourinary:     Neurological: He is alert and oriented to person, place, and time.  Skin: Skin is warm and dry.  Psychiatric: He has a normal mood and affect. His behavior is normal. Judgment and thought content normal.  Vitals reviewed.  Assessment and Plan :  1. Abscess, perineum -Patient presents for follow-up abscess of his perineum.  This is his third office visit for this problem.  He is doing well, improving.  Packing not present.  Wound was not repacked today.  He still has about 5 days left of doxycycline.  Continue taking this as directed.  Return to clinic in 4 days to recheck.  Mercer Pod, PA-C  Primary Care at [place] 03/11/2018 9:03 AM  Please note: Portions of this report may have been transcribed using dragon voice recognition  software. Every effort was made to ensure accuracy; however, inadvertent computerized transcription errors may be present.

## 2018-03-11 NOTE — Patient Instructions (Signed)
     IF you received an x-ray today, you will receive an invoice from [facility]. Please contact [facility] at [phone] with questions or concerns regarding your invoice.   IF you received labwork today, you will receive an invoice from [facility]. Please contact [facility] at [phone] with questions or concerns regarding your invoice.   Our billing staff will not be able to assist you with questions regarding bills from these companies.  You will be contacted with the lab results as soon as they are available. The fastest way to get your results is to activate your My Chart account. Instructions are located on the last page of this paperwork. If you have not heard from us regarding the results in 2 weeks, please contact this office.     

## 2018-03-15 ENCOUNTER — Ambulatory Visit (INDEPENDENT_AMBULATORY_CARE_PROVIDER_SITE_OTHER): Payer: 59 | Admitting: Physician Assistant

## 2018-03-15 ENCOUNTER — Encounter: Payer: Self-pay | Admitting: Physician Assistant

## 2018-03-15 ENCOUNTER — Other Ambulatory Visit: Payer: Self-pay

## 2018-03-15 VITALS — BP 110/76 | HR 83 | Temp 98.4°F | Resp 16 | Ht 70.0 in | Wt 217.8 lb

## 2018-03-15 DIAGNOSIS — L02215 Cutaneous abscess of perineum: Secondary | ICD-10-CM

## 2018-03-15 MED ORDER — DOXYCYCLINE HYCLATE 100 MG PO TABS: 100.0000 mg | ORAL_TABLET | Freq: Two times a day (BID) | ORAL | 0 refills | Status: DC

## 2018-03-15 NOTE — Patient Instructions (Signed)
     IF you received an x-ray today, you will receive an invoice from [facility]. Please contact [facility] at [phone] with questions or concerns regarding your invoice.   IF you received labwork today, you will receive an invoice from [facility]. Please contact [facility] at [phone] with questions or concerns regarding your invoice.   Our billing staff will not be able to assist you with questions regarding bills from these companies.  You will be contacted with the lab results as soon as they are available. The fastest way to get your results is to activate your My Chart account. Instructions are located on the last page of this paperwork. If you have not heard from us regarding the results in 2 weeks, please contact this office.     

## 2018-03-15 NOTE — Progress Notes (Signed)
Brian Gay  MRN: [id number] DOB: 1966-07-03  PCP: [name], DO  Subjective:  Pt is a 52 year old male who presents to clinic for f/u abscess. This is his fourt OV for this problem. Initial visit for this problem was on 7/22 and received an I&D with packing. No packing placed last OV. Mass is still present.  He feels like he is improving, however is unsure "I feel it every day, I can't tell if it's getting better or not" Denies pain, tenderness, warmth, drainage.  He has two days of antibiotic left. He is taking this as directed.   Review of Systems  Constitutional: Negative for chills and fever.  Skin: Positive for wound.   Patient Active Problem List   Diagnosis Date Noted  . Acute bronchitis 11/25/2017  . Acute maxillary sinusitis 11/25/2017  . Wheezing 11/25/2017  . Sinusitis 06/02/2017  . Stress and adjustment reaction 06/02/2017  . Low HDL (under 40) 01/26/2017  . Vitamin D deficiency 09/08/2016  . Tobacco abuse counseling 09/05/2016  . Elevated LDL cholesterol level 08/29/2016  . h/o Tobacco use disorder 08/04/2016  . Overweight (BMI 25.0-29.9) 08/04/2016  . Environmental and seasonal allergies 08/04/2016  . Fatigue- esp AM 08/04/2016  . Closed displaced fracture of middle phalanx of right ring finger 03/16/2016  . Volar plate injury of finger 03/16/2016  . Wellness examination 12/12/2015  . Essential hypertension 10/10/2015  . Allergic rhinitis 11/02/2012    Current Outpatient Medications on File Prior to Visit  Medication Sig Dispense Refill  . atorvastatin (LIPITOR) 40 MG tablet 1 tablet nightly before bedtime 90 tablet 3  . doxycycline (VIBRA-TABS) 100 MG tablet Take 1 tablet (100 mg total) by mouth 2 (two) times daily. 20 tablet 0  . FLUoxetine (PROZAC) 20 MG tablet Take 1 tablet (20 mg total) by mouth daily. 90 tablet 3  . losartan-hydrochlorothiazide (HYZAAR) 100-12.5 MG tablet Take 1 tablet by mouth daily. 90 tablet 3  . albuterol (PROVENTIL  HFA;VENTOLIN HFA) 108 (90 Base) MCG/ACT inhaler Inhale 1-2 puffs into the lungs every 6 (six) hours as needed for wheezing or shortness of breath. (Patient not taking: Reported on 03/09/2018) 16 g 1  . HYDROcodone-acetaminophen (NORCO/VICODIN) 5-325 MG tablet Take 1 tablet by mouth every 6 (six) hours as needed for moderate pain. Take 1-2 tabs 30 min prior to appointment for dressing change (Patient not taking: Reported on 03/15/2018) 9 tablet 0  . Hypertonic Nasal Wash (SINUS RINSE BOTTLE KIT) PACK Please do sinus rinses of your nasal passages twice daily followed by one spray Flonase each nostril.  Also, feel free to do a sinus rinse after any prolonged period of time outdoors (Patient not taking: Reported on 03/09/2018)     No current facility-administered medications on file prior to visit.     Allergies  Allergen Reactions  . Codeine Swelling     Objective:  BP 110/76 (BP Location: Left Arm, Patient Position: Sitting, Cuff Size: Normal)   Pulse 83   Temp 98.4 F (36.9 C) (Oral)   Resp 16   Ht 5' 10"  (1.778 m)   Wt 217 lb 12.8 oz (98.8 kg)   SpO2 100%   BMI 31.25 kg/m   Physical Exam  Constitutional: He is oriented to person, place, and time. He appears well-developed and well-nourished.  Genitourinary:     Neurological: He is alert and oriented to person, place, and time.  Skin: Skin is warm and dry.  Psychiatric: He has a normal mood and affect. His  behavior is normal. Judgment and thought content normal.  Vitals reviewed.   Assessment and Plan :  1. Perineal abscess - Abscess is improving. Plan to fill a few more days of Doxycycline. RTC in 3 days for recheck.   - doxycycline (VIBRA-TABS) 100 MG tablet; Take 1 tablet (100 mg total) by mouth 2 (two) times daily.  Dispense: 6 tablet; Refill: 0   Brian Margeaux Swantek, PA-C  Primary Care at [place] 03/15/2018 3:41 PM  Please note: Portions of this report may have been transcribed using dragon voice  recognition software. Every effort was made to ensure accuracy; however, inadvertent computerized transcription errors may be present.

## 2018-04-07 DIAGNOSIS — M9904 Segmental and somatic dysfunction of sacral region: Secondary | ICD-10-CM | POA: Diagnosis not present

## 2018-04-07 DIAGNOSIS — M545 Low back pain: Secondary | ICD-10-CM | POA: Diagnosis not present

## 2018-04-07 DIAGNOSIS — M9903 Segmental and somatic dysfunction of lumbar region: Secondary | ICD-10-CM | POA: Diagnosis not present

## 2018-04-11 DIAGNOSIS — M9904 Segmental and somatic dysfunction of sacral region: Secondary | ICD-10-CM | POA: Diagnosis not present

## 2018-04-11 DIAGNOSIS — M9903 Segmental and somatic dysfunction of lumbar region: Secondary | ICD-10-CM | POA: Diagnosis not present

## 2018-04-11 DIAGNOSIS — M545 Low back pain: Secondary | ICD-10-CM | POA: Diagnosis not present

## 2018-04-13 DIAGNOSIS — M9903 Segmental and somatic dysfunction of lumbar region: Secondary | ICD-10-CM | POA: Diagnosis not present

## 2018-04-13 DIAGNOSIS — M545 Low back pain: Secondary | ICD-10-CM | POA: Diagnosis not present

## 2018-04-13 DIAGNOSIS — M9904 Segmental and somatic dysfunction of sacral region: Secondary | ICD-10-CM | POA: Diagnosis not present

## 2018-05-10 ENCOUNTER — Other Ambulatory Visit: Payer: Self-pay

## 2018-05-10 ENCOUNTER — Other Ambulatory Visit (INDEPENDENT_AMBULATORY_CARE_PROVIDER_SITE_OTHER): Payer: 59

## 2018-05-10 DIAGNOSIS — E786 Lipoprotein deficiency: Secondary | ICD-10-CM

## 2018-05-10 DIAGNOSIS — E7800 Pure hypercholesterolemia, unspecified: Secondary | ICD-10-CM

## 2018-05-10 DIAGNOSIS — E559 Vitamin D deficiency, unspecified: Secondary | ICD-10-CM

## 2018-05-11 LAB — LIPID PANEL
CHOL/HDL RATIO: 6.1 ratio — AB (ref 0.0–5.0)
Cholesterol, Total: 220 mg/dL — ABNORMAL HIGH (ref 100–199)
HDL: 36 mg/dL — AB (ref >39)
LDL CALC: 164 mg/dL — AB (ref 0–99)
Triglycerides: 100 mg/dL (ref 0–149)
VLDL CHOLESTEROL CAL: 20 mg/dL (ref 5–40)

## 2018-05-11 LAB — VITAMIN D 25 HYDROXY (VIT D DEFICIENCY, FRACTURES): Vit D, 25-Hydroxy: 28.2 ng/mL — ABNORMAL LOW (ref 30.0–100.0)

## 2018-05-12 ENCOUNTER — Encounter: Payer: Self-pay | Admitting: Family Medicine

## 2018-05-12 ENCOUNTER — Ambulatory Visit (INDEPENDENT_AMBULATORY_CARE_PROVIDER_SITE_OTHER): Payer: 59 | Admitting: Family Medicine

## 2018-05-12 VITALS — BP 110/73 | HR 78 | Ht 70.0 in | Wt 219.9 lb

## 2018-05-12 DIAGNOSIS — E7800 Pure hypercholesterolemia, unspecified: Secondary | ICD-10-CM

## 2018-05-12 DIAGNOSIS — E785 Hyperlipidemia, unspecified: Secondary | ICD-10-CM

## 2018-05-12 DIAGNOSIS — I10 Essential (primary) hypertension: Secondary | ICD-10-CM | POA: Diagnosis not present

## 2018-05-12 DIAGNOSIS — E559 Vitamin D deficiency, unspecified: Secondary | ICD-10-CM

## 2018-05-12 DIAGNOSIS — F4329 Adjustment disorder with other symptoms: Secondary | ICD-10-CM

## 2018-05-12 DIAGNOSIS — E786 Lipoprotein deficiency: Secondary | ICD-10-CM

## 2018-05-12 MED ORDER — FLUOXETINE HCL 20 MG PO TABS: 40.0000 mg | ORAL_TABLET | Freq: Every day | ORAL | 3 refills | Status: DC

## 2018-05-12 MED ORDER — ATORVASTATIN CALCIUM 80 MG PO TABS: ORAL_TABLET | ORAL | 3 refills | Status: DC

## 2018-05-12 MED ORDER — VITAMIN D3 125 MCG (5000 UT) PO TABS: ORAL_TABLET | ORAL | 3 refills | Status: DC

## 2018-05-12 MED ORDER — LOSARTAN POTASSIUM-HCTZ 100-12.5 MG PO TABS: 1.0000 | ORAL_TABLET | Freq: Every day | ORAL | 3 refills | Status: DC

## 2018-05-12 NOTE — Patient Instructions (Signed)
Please take 40mg  nitely of the atorvastatin for 2 wks, and then go to 1 tab nitely

## 2018-05-12 NOTE — Progress Notes (Signed)
Impression and Recommendations:    1. Essential hypertension   2. Stress and adjustment reaction   3. Hyperlipidemia, unspecified hyperlipidemia type   4. Low HDL (under 40)   5. Vitamin D deficiency   6. Elevated LDL cholesterol level     Essential hypertension -Stable, well-controlled -Refilled medication -Encouraged pt to walk more/ eat healthier etc.  Stress and adjustment reaction -Increase medication from 1 to 2 tabs per day during days/ weeks of stress, then he may go back down to the 20 mg daily if needed.   - However patient requested an increase and we gave him 40 mg daily refill - pt knows exercise will help him relax  Hyperlipidemia -Started Lipitor 80 mg  ( long ago pt was prescribed this and only took it for 1 to 2 weeks.  Then he stopped taking it.  Tolerated it well without any side effects.)  -Encouraged pt to leave medicine near his toothbrush, so he won't forget to take medicine before bed -His 10-year ASCVD risk score is: 12%-extensively discussed with patient what this means clinically -Return for liver blood work  ALT in 6 weeks.   Vitamin D Deficiency -Level still too low and he has not taking supplements as prior told to -Encouraged pt to take supplement 5000 IUs daily.    Education and routine counseling performed. Handouts provided.   Medications Discontinued During This Encounter  Medication Reason  . doxycycline (VIBRA-TABS) 100 MG tablet Completed Course  . HYDROcodone-acetaminophen (NORCO/VICODIN) 5-325 MG tablet No longer needed (for PRN medications)  . Hypertonic Nasal Wash (SINUS RINSE BOTTLE KIT) PACK No longer needed (for PRN medications)  . atorvastatin (LIPITOR) 40 MG tablet   . losartan-hydrochlorothiazide (HYZAAR) 100-12.5 MG tablet Reorder  . FLUoxetine (PROZAC) 20 MG tablet Reorder     Meds ordered this encounter  Medications  . atorvastatin (LIPITOR) 80 MG tablet    Sig: 1 tablet nightly before bedtime    Dispense:   90 tablet    Refill:  3  . losartan-hydrochlorothiazide (HYZAAR) 100-12.5 MG tablet    Sig: Take 1 tablet by mouth daily.    Dispense:  90 tablet    Refill:  3  . FLUoxetine (PROZAC) 20 MG tablet    Sig: Take 2 tablets (40 mg total) by mouth daily.    Dispense:  180 tablet    Refill:  3  . Cholecalciferol (VITAMIN D3) 5000 units TABS    Sig: 5,000 IU OTC vitamin D3 daily.    Dispense:  90 tablet    Refill:  3    Gross side effects, risk and benefits, and alternatives of medications and treatment plan in general discussed with patient.  Patient is aware that all medications have potential side effects and we are unable to predict every side effect or drug-drug interaction that may occur.   Patient will call with any questions prior to using medication if they have concerns.  Expresses verbal understanding and consents to current therapy and treatment regimen.  No barriers to understanding were identified.  Red flag symptoms and signs discussed in detail.  Patient expressed understanding regarding what to do in case of emergency\urgent symptoms  Please see AVS handed out to patient at the end of our visit for further patient instructions/ counseling done pertaining to today's office visit.   Return for 6wks lab only ALT; 60mo FBW 1 wk prior(FLP, CMP, Vit d.     Note:  This document was prepared using Dragon  voice recognition software and may include unintentional dictation errors.  This document serves as a record of services personally performed by [name], DO. It was created on her behalf by Wilburn Mylar, a trained medical scribe. The creation of this record is based on the scribe's personal observations and the provider's statements to them.   I have reviewed the above medical documentation for accuracy and completeness and I concur.  [name] 05/12/18 5:01  PM  --------------------------------------------------------------------------------------------------------------------------------------------------------------------------------------------------------------------------------------------    Subjective:    CC:  Chief Complaint  Patient presents with  . Follow-up    HPI: Brian Gay is a 52 y.o. male who presents to [place] at [facility] today for follow-up of mood and hypertension. He notes hopefully changing jobs to [employer].  Stress and Adjustment Reaction: -Well-controlled -He notes times of stress are coming up  HTN: -Well-controlled -He reports 110s/70s  HLD: -Pt not taking Lipitor. He forgets to take it.   -He notes no side effects.  Vitamin D Deficiency: -Not currently taking any supplements    Depression screen [facility] 2/9 05/12/2018 03/15/2018 03/11/2018  Decreased Interest 0 0 0  Down, Depressed, Hopeless 0 0 0  PHQ - 2 Score 0 0 0  Altered sleeping 0 - -  Tired, decreased energy 0 - -  Change in appetite 0 - -  Feeling bad or failure about yourself  0 - -  Trouble concentrating 0 - -  Moving slowly or fidgety/restless 0 - -  Suicidal thoughts 0 - -  PHQ-9 Score 0 - -  Difficult doing work/chores Not difficult at all - -     GAD 7 : Generalized Anxiety Score 05/12/2018  Nervous, Anxious, on Edge 0  Control/stop worrying 0  Worry too much - different things 0  Trouble relaxing 0  Restless 0  Easily annoyed or irritable 0  Afraid - awful might happen 0  Total GAD 7 Score 0  Anxiety Difficulty Not difficult at all     Wt Readings from Last 3 Encounters:  05/12/18 219 lb 14.4 oz (99.7 kg)  03/15/18 217 lb 12.8 oz (98.8 kg)  03/11/18 214 lb (97.1 kg)   BP Readings from Last 3 Encounters:  05/12/18 110/73  03/15/18 110/76  03/11/18 118/78   Pulse Readings from Last 3 Encounters:  05/12/18 78  03/15/18 83  03/11/18 74   BMI Readings from Last 3 Encounters:  05/12/18 31.55  kg/m  03/15/18 31.25 kg/m  03/11/18 29.85 kg/m         Patient Care Team    Relationship Specialty Notifications Start End  [name], DO PCP - General Family Medicine  08/04/16   Coolidge Breeze, MD Consulting Physician Dentistry  12/11/15   Daryll Brod, [place] Physician Orthopedic Surgery  01/26/17      Patient Active Problem List   Diagnosis Date Noted  . Stress and adjustment reaction 06/02/2017    Priority: High  . Elevated LDL cholesterol level 08/29/2016    Priority: High  . h/o Tobacco use disorder 08/04/2016    Priority: High  . Essential hypertension 10/10/2015    Priority: High  . Low HDL (under 40) 01/26/2017    Priority: Medium  . Tobacco abuse counseling 09/05/2016    Priority: Medium  . Overweight (BMI 25.0-29.9) 08/04/2016    Priority: Medium  . Environmental and seasonal allergies 08/04/2016    Priority: Medium  . Vitamin D deficiency 09/08/2016    Priority: Low  . Hyperlipidemia  05/12/2018  . Acute bronchitis 11/25/2017  . Acute maxillary sinusitis 11/25/2017  . Wheezing 11/25/2017  . Sinusitis 06/02/2017  . Fatigue- esp AM 08/04/2016  . Closed displaced fracture of middle phalanx of right ring finger 03/16/2016  . Volar plate injury of finger 03/16/2016  . Wellness examination 12/12/2015  . Allergic rhinitis 11/02/2012    Past Medical history, Surgical history, Family history, Social history, Allergies and Medications have been entered into the medical record, reviewed and changed as needed.    Current Meds  Medication Sig  . albuterol (PROVENTIL HFA;VENTOLIN HFA) 108 (90 Base) MCG/ACT inhaler Inhale 1-2 puffs into the lungs every 6 (six) hours as needed for wheezing or shortness of breath.  Marland Kitchen atorvastatin (LIPITOR) 80 MG tablet 1 tablet nightly before bedtime  . FLUoxetine (PROZAC) 20 MG tablet Take 2 tablets (40 mg total) by mouth daily.  Marland Kitchen losartan-hydrochlorothiazide (HYZAAR) 100-12.5 MG tablet Take 1 tablet by mouth  daily.  . [DISCONTINUED] atorvastatin (LIPITOR) 40 MG tablet 1 tablet nightly before bedtime  . [DISCONTINUED] FLUoxetine (PROZAC) 20 MG tablet Take 1 tablet (20 mg total) by mouth daily.  . [DISCONTINUED] losartan-hydrochlorothiazide (HYZAAR) 100-12.5 MG tablet Take 1 tablet by mouth daily.    Allergies:  Allergies  Allergen Reactions  . Codeine Swelling     Review of Systems: Review of Systems: General:   No F/C, wt loss Pulm:   No DIB, SOB, pleuritic chest pain Card:  No CP, palpitations Abd:  No n/v/d or pain Ext:  No inc edema from baseline Psych: no SI/ HI    Objective:   Blood pressure 110/73, pulse 78, height 5' 10" (1.778 m), weight 219 lb 14.4 oz (99.7 kg), SpO2 100 %. Body mass index is 31.55 kg/m. General:  Well Developed, well nourished, appropriate for stated age.  Neuro:  Alert and oriented,  extra-ocular muscles intact  HEENT:  Normocephalic, atraumatic, neck supple, no carotid bruits appreciated  Skin:  no gross rash, warm, pink. Cardiac:  RRR, S1 S2 Respiratory:  ECTA B/L and A/P, Not using accessory muscles, speaking in full sentences- unlabored. Vascular:  Ext warm, no cyanosis apprec.; cap RF less 2 sec. Psych:  No HI/SI, judgement and insight good, Euthymic mood. Full Affect.

## 2018-06-22 ENCOUNTER — Other Ambulatory Visit (INDEPENDENT_AMBULATORY_CARE_PROVIDER_SITE_OTHER): Payer: 59

## 2018-06-22 ENCOUNTER — Other Ambulatory Visit: Payer: Self-pay

## 2018-06-22 DIAGNOSIS — Z79899 Other long term (current) drug therapy: Secondary | ICD-10-CM

## 2018-06-23 LAB — ALT: ALT: 24 IU/L (ref 0–44)

## 2018-07-26 ENCOUNTER — Ambulatory Visit: Payer: 59 | Admitting: Family Medicine

## 2018-07-26 ENCOUNTER — Encounter: Payer: Self-pay | Admitting: Family Medicine

## 2018-07-26 VITALS — BP 105/69 | HR 85 | Temp 98.3°F | Ht 70.0 in | Wt 223.6 lb

## 2018-07-26 DIAGNOSIS — H00012 Hordeolum externum right lower eyelid: Secondary | ICD-10-CM

## 2018-07-26 DIAGNOSIS — H6690 Otitis media, unspecified, unspecified ear: Secondary | ICD-10-CM

## 2018-07-26 DIAGNOSIS — J019 Acute sinusitis, unspecified: Secondary | ICD-10-CM

## 2018-07-26 MED ORDER — AMOXICILLIN 500 MG PO CAPS: 1000.0000 mg | ORAL_CAPSULE | Freq: Three times a day (TID) | ORAL | 0 refills | Status: AC

## 2018-07-26 NOTE — Patient Instructions (Signed)

## 2018-07-26 NOTE — Progress Notes (Signed)
Pt here for an acute care OV today   Impression and Recommendations:    1. Hordeolum externum of right lower eyelid   2. Acute otitis media, unspecified otitis media type- R serous effusion   3. Acute sinusitis, recurrence not specified, unspecified location      Meds ordered this encounter  Medications  . amoxicillin (AMOXIL) 500 MG capsule    Sig: Take 2 capsules (1,000 mg total) by mouth 3 (three) times daily for 10 days.    Dispense:  60 capsule    Refill:  0    Right Hordeolum:  -Will treat with amoxicillin prescription   -Advised use of warm compresses to increase blood flow and aid in unblocking the area.   Right Otitis Media:  -Will treat with amoxicillin prescription   -Advised for the patient to return to the office, if his symptoms worsen.   Follow up PRN or sooner if symptoms worsen.   Education and routine counseling performed. Handouts provided  Gross side effects, risk and benefits, and alternatives of medications and treatment plan in general discussed with patient.  Patient is aware that all medications have potential side effects and we are unable to predict every side effect or drug-drug interaction that may occur.   Patient will call with any questions prior to using medication if they have concerns.    Expresses verbal understanding and consents to current therapy and treatment regimen.  No barriers to understanding were identified.  Red flag symptoms and signs discussed in detail.  Patient expressed understanding regarding what to do in case of emergency\urgent symptoms   Please see AVS handed out to patient at the end of our visit for further patient instructions/ counseling done pertaining to today's office visit.   Return if symptoms worsen or fail to improve, for 2)Follow-up near future at your convenience to address chronic conditions.     Note:  This document was prepared occasionally using Dragon voice recognition software and may  include unintentional dictation errors in addition to a scribe.  This document serves as a record of services personally performed by [name], DO. It was created on her behalf by Steva Colder, a trained medical scribe. The creation of this record is based on the scribe's personal observations and the provider's statements to them.   I have reviewed the above medical documentation for accuracy and completeness and I concur.  [name], DO    --------------------------------------------------------------------------------------------------------------------------------------------------------------------------------------------------------------------------------------------    Subjective:    CC:  Chief Complaint  Patient presents with  . Eye Problem    HPI: Brian Gay is a 52 y.o. male who presents to [place] [facility] today for issues as discussed below.  He notes that he has had a stye to his left eye initially, then to the right eye, now with swelling to his right lower eyelid. He has tried warm compresses without any relief of his symptoms. He reports associated sinus fullness/pressure sensation with dizziness. He denies fever and any other symptoms.    Wt Readings from Last 3 Encounters:  07/26/18 223 lb 9.6 oz (101.4 kg)  05/12/18 219 lb 14.4 oz (99.7 kg)  03/15/18 217 lb 12.8 oz (98.8 kg)    BP Readings from Last 3 Encounters:  07/26/18 105/69  05/12/18 110/73  03/15/18 110/76   BMI Readings from Last 3 Encounters:  07/26/18 32.08 kg/m  05/12/18 31.55 kg/m  03/15/18 31.25 kg/m     Patient Care Team  Relationship Specialty Notifications Start End  [name], DO PCP - General Family Medicine  08/04/16   Coolidge Breeze, MD Consulting Physician Dentistry  12/11/15   Daryll Brod, [place] Physician Orthopedic Surgery  01/26/17      Patient Active Problem List   Diagnosis Date Noted  . Stress and adjustment  reaction 06/02/2017    Priority: High  . Elevated LDL cholesterol level 08/29/2016    Priority: High  . h/o Tobacco use disorder 08/04/2016    Priority: High  . Essential hypertension 10/10/2015    Priority: High  . Low HDL (under 40) 01/26/2017    Priority: Medium  . Tobacco abuse counseling 09/05/2016    Priority: Medium  . Overweight (BMI 25.0-29.9) 08/04/2016    Priority: Medium  . Environmental and seasonal allergies 08/04/2016    Priority: Medium  . Vitamin D deficiency 09/08/2016    Priority: Low  . Hyperlipidemia 05/12/2018  . Acute bronchitis 11/25/2017  . Acute maxillary sinusitis 11/25/2017  . Wheezing 11/25/2017  . Sinusitis 06/02/2017  . Fatigue- esp AM 08/04/2016  . Closed displaced fracture of middle phalanx of right ring finger 03/16/2016  . Volar plate injury of finger 03/16/2016  . Wellness examination 12/12/2015  . Allergic rhinitis 11/02/2012      Past Medical History:  Diagnosis Date  . Allergy   . Hypertension   . Kidney stones      Past Surgical History:  Procedure Laterality Date  . CLOSED REDUCTION FINGER WITH PERCUTANEOUS PINNING Right 03/19/2016   Procedure: CLOSED REDUCTION right ring  FINGER;  Surgeon: Daryll Brod, MD;  Location: [facility];  Service: Orthopedics;  Laterality: Right;  . cyst removed      pilonidal cyst  . WISDOM TOOTH EXTRACTION       Family History  Problem Relation Age of Onset  . Heart disease Mother   . Hypertension Mother   . Heart disease Father   . Cancer Father        prostate  . Prostate cancer Father   . Hyperlipidemia Father   . Stroke Father   . Heart disease Maternal Grandmother   . Heart disease Maternal Grandfather   . Heart disease Paternal Grandmother   . Heart disease Paternal Grandfather   . Colon cancer Neg Hx   . Colon polyps Neg Hx   . Rectal cancer Neg Hx   . Stomach cancer Neg Hx      Social History   Socioeconomic History  . Marital status: Married    Spouse name:  Tammy  . Number of children: 1  . Years of education: 34  . Highest education level: Not on file  Occupational History  . 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 Financial resource strain: Not on file  . Food insecurity:    Worry: Not on file    Inability: Not on file  . Transportation needs:    Medical: Not on file    Non-medical: Not on file  Tobacco Use  . Smoking status: Former Smoker    Packs/day: 0.50    Years: 20.00    Pack years: 10.00    Last attempt to quit: 07/2016    Years since quitting: 2.0  . Smokeless tobacco: Current User    Types: Chew  Substance and Sexual Activity  . Alcohol use: Yes    Alcohol/week: 0.0 standard drinks    Comment: occasional  . Drug use:  No  . Sexual activity: Yes    Partners: Female  Lifestyle  . Physical activity:    Days per week: Not on file    Minutes per session: Not on file  . Stress: Not on file  Relationships  . Social connections:    Talks on phone: Not on file    Gets together: Not on file    Attends religious service: Not on file    Active member of club or organization: Not on file    Attends meetings of clubs or organizations: Not on file    Relationship status: Not on file  . Intimate partner violence:    Fear of current or ex partner: Not on file    Emotionally abused: Not on file    Physically abused: Not on file    Forced sexual activity: Not on file  Other Topics Concern  . Not on file  Social History Narrative   Married since 1984.     Current Meds  Medication Sig  . albuterol (PROVENTIL HFA;VENTOLIN HFA) 108 (90 Base) MCG/ACT inhaler Inhale 1-2 puffs into the lungs every 6 (six) hours as needed for wheezing or shortness of breath.  Marland Kitchen atorvastatin (LIPITOR) 80 MG tablet 1 tablet nightly before bedtime  . Cholecalciferol (VITAMIN D3) 5000 units TABS 5,000 IU OTC vitamin D3 daily.  Marland Kitchen FLUoxetine (PROZAC) 20 MG tablet Take 2 tablets (40 mg total) by mouth  daily.  Marland Kitchen losartan-hydrochlorothiazide (HYZAAR) 100-12.5 MG tablet Take 1 tablet by mouth daily.    Allergies:  Allergies  Allergen Reactions  . Codeine Swelling     Review of Systems: General:   Denies fever, chills, unexplained weight loss.  Optho/Auditory:   Denies visual changes, blurred vision/LOV. (+) stye noted to right eye Respiratory:   Denies wheeze, DOE more than baseline levels.   Cardiovascular:   Denies chest pain, palpitations, new onset peripheral edema  Gastrointestinal:   Denies nausea, vomiting, diarrhea, abd pain.  Genitourinary: Denies dysuria, freq/ urgency, flank pain or discharge from genitals.  Endocrine:     Denies hot or cold intolerance, polyuria, polydipsia. Musculoskeletal:   Denies unexplained myalgias, joint swelling, unexplained arthralgias, gait problems.  Skin:  Denies new onset rash, suspicious lesions Neurological:     Denies dizziness, unexplained weakness, numbness  Psychiatric/Behavioral:   Denies mood changes, suicidal or homicidal ideations, hallucinations    Objective:   Blood pressure 105/69, pulse 85, temperature 98.3 F (36.8 C), height 5\' 10"  (1.778 m), weight 223 lb 9.6 oz (101.4 kg), SpO2 99 %. Body mass index is 32.08 kg/m. General:  Well Developed, well nourished, appropriate for stated age.  Neuro:  Alert and oriented,  extra-ocular muscles intact  HEENT:  Normocephalic, atraumatic, neck supple. Opaque effusion in his right TM with air fluid levels. Stye present to inferior lid, right eye.  Skin:  no gross rash, warm, pink. Cardiac:  RRR, S1 S2 Respiratory:  ECTA B/L and A/P, Not using accessory muscles, speaking in full sentences- unlabored. Vascular:  Ext warm, no cyanosis apprec.; cap RF less 2 sec. Psych:  No HI/SI, judgement and insight good, Euthymic mood. Full Affect.

## 2018-09-12 ENCOUNTER — Other Ambulatory Visit (INDEPENDENT_AMBULATORY_CARE_PROVIDER_SITE_OTHER): Payer: 59

## 2018-09-12 DIAGNOSIS — E785 Hyperlipidemia, unspecified: Secondary | ICD-10-CM | POA: Diagnosis not present

## 2018-09-12 DIAGNOSIS — E786 Lipoprotein deficiency: Secondary | ICD-10-CM | POA: Diagnosis not present

## 2018-09-12 DIAGNOSIS — E559 Vitamin D deficiency, unspecified: Secondary | ICD-10-CM

## 2018-09-12 DIAGNOSIS — I10 Essential (primary) hypertension: Secondary | ICD-10-CM | POA: Diagnosis not present

## 2018-09-12 DIAGNOSIS — E7800 Pure hypercholesterolemia, unspecified: Secondary | ICD-10-CM

## 2018-09-13 LAB — CBC WITH DIFFERENTIAL/PLATELET
Basophils Absolute: 0 10*3/uL (ref 0.0–0.2)
Basos: 1 %
EOS (ABSOLUTE): 0.1 10*3/uL (ref 0.0–0.4)
EOS: 1 %
Hematocrit: 48.8 % (ref 37.5–51.0)
Hemoglobin: 16.4 g/dL (ref 13.0–17.7)
IMMATURE GRANULOCYTES: 1 %
Immature Grans (Abs): 0 10*3/uL (ref 0.0–0.1)
Lymphocytes Absolute: 1.5 10*3/uL (ref 0.7–3.1)
Lymphs: 25 %
MCH: 30.7 pg (ref 26.6–33.0)
MCHC: 33.6 g/dL (ref 31.5–35.7)
MCV: 91 fL (ref 79–97)
MONOS ABS: 0.5 10*3/uL (ref 0.1–0.9)
Monocytes: 8 %
NEUTROS PCT: 64 %
Neutrophils Absolute: 3.9 10*3/uL (ref 1.4–7.0)
Platelets: 212 10*3/uL (ref 150–450)
RBC: 5.35 x10E6/uL (ref 4.14–5.80)
RDW: 12.5 % (ref 11.6–15.4)
WBC: 5.9 10*3/uL (ref 3.4–10.8)

## 2018-09-13 LAB — COMPREHENSIVE METABOLIC PANEL
ALBUMIN: 4.4 g/dL (ref 3.8–4.9)
ALK PHOS: 117 IU/L (ref 39–117)
ALT: 24 IU/L (ref 0–44)
AST: 16 IU/L (ref 0–40)
Albumin/Globulin Ratio: 1.9 (ref 1.2–2.2)
BUN/Creatinine Ratio: 13 (ref 9–20)
BUN: 14 mg/dL (ref 6–24)
Bilirubin Total: 0.5 mg/dL (ref 0.0–1.2)
CALCIUM: 9.4 mg/dL (ref 8.7–10.2)
CO2: 24 mmol/L (ref 20–29)
CREATININE: 1.1 mg/dL (ref 0.76–1.27)
Chloride: 104 mmol/L (ref 96–106)
GFR calc Af Amer: 89 mL/min/{1.73_m2} (ref >59)
GFR, EST NON AFRICAN AMERICAN: 77 mL/min/{1.73_m2} (ref >59)
GLOBULIN, TOTAL: 2.3 g/dL (ref 1.5–4.5)
Glucose: 95 mg/dL (ref 65–99)
Potassium: 4.2 mmol/L (ref 3.5–5.2)
SODIUM: 140 mmol/L (ref 134–144)
TOTAL PROTEIN: 6.7 g/dL (ref 6.0–8.5)

## 2018-09-13 LAB — VITAMIN D 25 HYDROXY (VIT D DEFICIENCY, FRACTURES): Vit D, 25-Hydroxy: 21.7 ng/mL — ABNORMAL LOW (ref 30.0–100.0)

## 2018-09-13 LAB — LIPID PANEL
CHOLESTEROL TOTAL: 132 mg/dL (ref 100–199)
Chol/HDL Ratio: 4 ratio (ref 0.0–5.0)
HDL: 33 mg/dL — ABNORMAL LOW (ref >39)
LDL Calculated: 81 mg/dL (ref 0–99)
TRIGLYCERIDES: 91 mg/dL (ref 0–149)
VLDL Cholesterol Cal: 18 mg/dL (ref 5–40)

## 2018-09-19 ENCOUNTER — Encounter: Payer: Self-pay | Admitting: Family Medicine

## 2018-09-19 ENCOUNTER — Ambulatory Visit (INDEPENDENT_AMBULATORY_CARE_PROVIDER_SITE_OTHER): Payer: 59 | Admitting: Family Medicine

## 2018-09-19 VITALS — BP 111/75 | HR 92 | Temp 98.3°F | Ht 70.0 in | Wt 224.9 lb

## 2018-09-19 DIAGNOSIS — I10 Essential (primary) hypertension: Secondary | ICD-10-CM

## 2018-09-19 DIAGNOSIS — E785 Hyperlipidemia, unspecified: Secondary | ICD-10-CM

## 2018-09-19 DIAGNOSIS — E786 Lipoprotein deficiency: Secondary | ICD-10-CM

## 2018-09-19 DIAGNOSIS — E669 Obesity, unspecified: Secondary | ICD-10-CM

## 2018-09-19 DIAGNOSIS — E559 Vitamin D deficiency, unspecified: Secondary | ICD-10-CM

## 2018-09-19 DIAGNOSIS — F4329 Adjustment disorder with other symptoms: Secondary | ICD-10-CM | POA: Diagnosis not present

## 2018-09-19 DIAGNOSIS — R5383 Other fatigue: Secondary | ICD-10-CM

## 2018-09-19 MED ORDER — VITAMIN D (ERGOCALCIFEROL) 1.25 MG (50000 UNIT) PO CAPS: ORAL_CAPSULE | ORAL | 3 refills | Status: DC

## 2018-09-19 NOTE — Progress Notes (Signed)
Assessment and plan:  1. Essential hypertension   2. Stress and adjustment reaction   3. Hyperlipidemia, unspecified hyperlipidemia type   4. Low HDL (under 40)   5. Obesity, Class I, BMI 30-34.9   6. Vitamin D deficiency   7. Fatigue, unspecified type     1. Reviewed recent lab work (09/12/2018) in depth with patient today.  All lab work within normal limits unless otherwise noted.  Extensive education provided.  All questions were answered.  - To help preserve organ health, reviewed importance of adequate physical activity, hydration, avoiding nephrotoxic substances, and avoiding hepatotoxic substances.  2. Vitamin D Deficiency = 21.7 - Reviewed that Vitamin D Deficiency can contribute to feelings of fatigue.  - STRONGLY advised patient to take supplementation as prescribed. - Discussed goal range as 40-50.  - Take 5000 IU's OTC daily. - Begin once weekly supplementation.  - Re-check in 4 months.  3. Hyperlipidemia & Low HDL - Managed on Statins HDL = 33 LDL = 81, down from 164.  - LDL at goal at this time. - Continue treatment plan as prescribed. - Patient tolerating meds well without complication.  Denies S-E  - Dietary changes such as low saturated & trans fat and low carb/ ketogenic diets discussed with patient.  Encouraged regular exercise and weight loss when appropriate.   - Educational handouts provided at patient's desire.  The 10-year ASCVD risk score Mikey Bussing DC Brooke Bonito., et al., 2013) is: 7%   Values used to calculate the score:     Age: 59 years     Sex: Male     Is Non-Hispanic African American: No     Diabetic: No     Tobacco smoker: Yes     Systolic Blood Pressure: 564 mmHg     Is BP treated: Yes     HDL Cholesterol: 33 mg/dL     Total Cholesterol: 132 mg/dL  4. Fatigue - Patient will take his prescription and once daily Vitamin D, since he has not been taking even the daily dose OTC.   If his fatigue is not improved in 4 months when he comes in for repeat of his vitamin D level, we will add labs for TSH, Free T3, and Free T4.  5. Chronic Sinus Concerns - Advised the patient to continue using AYR or Neilmed sinus rinses BID followed by flonase BID (one spray to each nostril). Advised that the patient may also incorporate allegra or claritin PRN.   6. Possible Sleep Apnea - If patient has ongoing concerns about his snoring or possible sleep apnea, advised patient to obtain a sleep study.  - Will continue to monitor.  7. Mood Management - Stable at this time. - Continue treatment plan a prescribed.  See med list below. - Patient tolerating meds well without complication.  Denies S-E.  - Reviewed the "spokes of the wheel" of mood and health management.  Stressed the importance of ongoing prudent habits, including regular exercise, appropriate sleep hygiene, healthful dietary habits, and prayer/meditation to relax.  8. BMI Counseling - BMI of 32.27 Explained to patient what BMI refers to, and what it means medically.  Told patient to think about it as a "medical risk stratification measurement" and how increasing BMI is associated with increasing risk/ or worsening state of various diseases such as hypertension, hyperlipidemia, diabetes, premature OA, depression etc.  American Heart Association guidelines for healthy diet, basically Mediterranean diet, and exercise guidelines of 30 minutes 5 days  per week or more discussed in detail.  Health counseling performed.  All questions answered.  9. Lifestyle & Preventative Health Maintenance - Advised patient to continue working toward exercising to improve overall mental, physical, and emotional health.    - Encouraged patient to engage in daily physical activity, especially a formal exercise routine.  Recommended that the patient eventually strive for at least 150 minutes of moderate cardiovascular activity per week according to  guidelines established by the [facility].   - Healthy dietary habits encouraged, including low-carb, and high amounts of lean protein in diet.   - Patient should also consume adequate amounts of water.   Education and routine counseling performed. Handouts provided.   Meds ordered this encounter  Medications  . Vitamin D, Ergocalciferol, (DRISDOL) 1.25 MG (50000 UT) CAPS capsule    Sig: 1 tab wkly    Dispense:  12 capsule    Refill:  3    Medications Discontinued During This Encounter  Medication Reason  . albuterol (PROVENTIL HFA;VENTOLIN HFA) 108 (90 Base) MCG/ACT inhaler No longer needed (for PRN medications)      Return for 50mo- vit D def, wt loss, fatigue-chronic, bp, chol.   Anticipatory guidance and routine counseling done re: condition, txmnt options and need for follow up. All questions of patient's were answered.   Gross side effects, risk and benefits, and alternatives of medications discussed with patient.  Patient is aware that all medications have potential side effects and we are unable to predict every sideeffect or drug-drug interaction that may occur.  Expresses verbal understanding and consents to current therapy plan and treatment regiment.  Please see AVS handed out to patient at the end of our visit for additional patient instructions/ counseling done pertaining to today's office visit.  Note:  This document was prepared using Dragon voice recognition software and may include unintentional dictation errors.    This document serves as a record of services personally performed by [name], DO. It was created on her behalf by Toni Amend, a trained medical scribe. The creation of this record is based on the scribe's personal observations and the provider's statements to them.   I have reviewed the above medical documentation for accuracy and completeness and I concur.  [name], DO 09/19/2018 4:27  PM      ----------------------------------------------------------------------------------------------------------------------  Subjective:   CC:   Brian Gay is a 53 y.o. male who presents to [place] at [facility] today for review and discussion of recent bloodwork that was done in addition to f/up on chronic conditions we are managing for pt.  1. All recent blood work that we ordered was reviewed with patient today.  Patient was counseled on all abnormalities and we discussed dietary and lifestyle changes that could help those values (also medications when appropriate).  Extensive health counseling performed and all patient's concerns/ questions were addressed.  See labs below and also plan for more details of these abnormalities  Fatigue States that he stays sleepy all the time.  Says he never has energy.  Notes that he has not been taking Vitamin D.  Mood Management "If I know I'll have a bad week, I'll take two [Prozac] per day.  If I know everything is okay, I will take one per day."  States that he is somewhat happier in warmer months.  Blood Pressure, Hyperlipidemia, & Statin Management Notes that he is taking his cholesterol medication every night.  Feels that his blood pressure has been good.  Denies regular use of ibuprofen.  Chronic Sinus Concerns Patient states "I just want to make sure that it's not coming back."  He does not do his sinus rinses every day.  States "I probably do them three times per week."  Patient with Concerns about Sleep Apnea Feels he snores sometimes.   Wt Readings from Last 3 Encounters:  09/19/18 224 lb 14.4 oz (102 kg)  07/26/18 223 lb 9.6 oz (101.4 kg)  05/12/18 219 lb 14.4 oz (99.7 kg)   BP Readings from Last 3 Encounters:  09/19/18 111/75  07/26/18 105/69  05/12/18 110/73   Pulse Readings from Last 3 Encounters:  09/19/18 92  07/26/18 85  05/12/18 78   BMI Readings from Last 3 Encounters:  09/19/18  32.27 kg/m  07/26/18 32.08 kg/m  05/12/18 31.55 kg/m     Patient Care Team    Relationship Specialty Notifications Start End  [name], DO PCP - General Family Medicine  08/04/16   Coolidge Breeze, MD Consulting Physician Dentistry  12/11/15   Daryll Brod, MD Consulting Physician Orthopedic Surgery  01/26/17     Full medical history updated and reviewed in the office today  Patient Active Problem List   Diagnosis Date Noted  . Stress and adjustment reaction 06/02/2017    Priority: High  . Elevated LDL cholesterol level 08/29/2016    Priority: High  . h/o Tobacco use disorder 08/04/2016    Priority: High  . Essential hypertension 10/10/2015    Priority: High  . Low HDL (under 40) 01/26/2017    Priority: Medium  . Tobacco abuse counseling 09/05/2016    Priority: Medium  . Obese 08/04/2016    Priority: Medium  . Environmental and seasonal allergies 08/04/2016    Priority: Medium  . Vitamin D deficiency 09/08/2016    Priority: Low  . Obesity, Class I, BMI 30-34.9 09/19/2018  . Hyperlipidemia 05/12/2018  . Acute bronchitis 11/25/2017  . Acute maxillary sinusitis 11/25/2017  . Wheezing 11/25/2017  . Sinusitis 06/02/2017  . Fatigue- esp AM 08/04/2016  . Closed displaced fracture of middle phalanx of right ring finger 03/16/2016  . Volar plate injury of finger 03/16/2016  . Wellness examination 12/12/2015  . Allergic rhinitis 11/02/2012    Past Medical History:  Diagnosis Date  . Allergy   . Hypertension   . Kidney stones     Past Surgical History:  Procedure Laterality Date  . CLOSED REDUCTION FINGER WITH PERCUTANEOUS PINNING Right 03/19/2016   Procedure: CLOSED REDUCTION right ring  FINGER;  Surgeon: Daryll Brod, MD;  Location: [place];  Service: Orthopedics;  Laterality: Right;  . cyst removed      pilonidal cyst  . WISDOM TOOTH EXTRACTION      Social History   Tobacco Use  . Smoking status: Former Smoker    Packs/day: 0.50     Years: 20.00    Pack years: 10.00    Last attempt to quit: 07/2016    Years since quitting: 2.1  . Smokeless tobacco: Current User    Types: Chew  Substance Use Topics  . Alcohol use: Yes    Alcohol/week: 0.0 standard drinks    Comment: occasional    Family Hx: Family History  Problem Relation Age of Onset  . Heart disease Mother   . Hypertension Mother   . Heart disease Father   . Cancer Father        prostate  . Prostate cancer Father   . Hyperlipidemia Father   .  Stroke Father   . Heart disease Maternal Grandmother   . Heart disease Maternal Grandfather   . Heart disease Paternal Grandmother   . Heart disease Paternal Grandfather   . Colon cancer Neg Hx   . Colon polyps Neg Hx   . Rectal cancer Neg Hx   . Stomach cancer Neg Hx      Medications: Current Outpatient Medications  Medication Sig Dispense Refill  . atorvastatin (LIPITOR) 80 MG tablet 1 tablet nightly before bedtime 90 tablet 3  . Cholecalciferol (VITAMIN D3) 5000 units TABS 5,000 IU OTC vitamin D3 daily. 90 tablet 3  . FLUoxetine (PROZAC) 20 MG tablet Take 2 tablets (40 mg total) by mouth daily. 180 tablet 3  . losartan-hydrochlorothiazide (HYZAAR) 100-12.5 MG tablet Take 1 tablet by mouth daily. 90 tablet 3  . Vitamin D, Ergocalciferol, (DRISDOL) 1.25 MG (50000 UT) CAPS capsule 1 tab wkly 12 capsule 3   No current facility-administered medications for this visit.     Allergies:  Allergies  Allergen Reactions  . Codeine Swelling     Review of Systems: General:   No F/C, wt loss Pulm:   No DIB, SOB, pleuritic chest pain Card:  No CP, palpitations Abd:  No n/v/d or pain Ext:  No inc edema from baseline  Objective:  Blood pressure 111/75, pulse 92, temperature 98.3 F (36.8 C), height 5\' 10"  (1.778 m), weight 224 lb 14.4 oz (102 kg), SpO2 98 %. Body mass index is 32.27 kg/m. Gen:   Well NAD, A and O *3 HEENT:  Right TM is slightly retracted and minimally opaque.  Anatomy seen.  [place]/AT, EOMI,   MMM Lungs:   Normal work of breathing. CTA B/L, no Wh, rhonchi Heart:   RRR, S1, S2 WNL's, no MRG Abd:   No gross distention Exts:    warm, pink,  Brisk capillary refill, warm and well perfused.  Psych:    No HI/SI, judgement and insight good, Euthymic mood. Full Affect.   Recent Results (from the past 2160 hour(s))  ALT     Status: None   Collection Time: 06/22/18  1:33 PM  Result Value Ref Range   ALT 24 0 - 44 IU/L  Lipid panel     Status: Abnormal   Collection Time: 09/12/18  8:16 AM  Result Value Ref Range   Cholesterol, Total 132 100 - 199 mg/dL   Triglycerides 91 0 - 149 mg/dL   HDL 33 (L) >39 mg/dL   VLDL Cholesterol Cal 18 5 - 40 mg/dL   LDL Calculated 81 0 - 99 mg/dL   Chol/HDL Ratio 4.0 0.0 - 5.0 ratio    Comment:                                   T. Chol/HDL Ratio                                             Men  Women                               1/2 Avg.Risk  3.4    3.3  Avg.Risk  5.0    4.4                                2X Avg.Risk  9.6    7.1                                3X Avg.Risk 23.4   11.0   Comprehensive metabolic panel     Status: None   Collection Time: 09/12/18  8:16 AM  Result Value Ref Range   Glucose 95 65 - 99 mg/dL   BUN 14 6 - 24 mg/dL   Creatinine, Ser 1.10 0.76 - 1.27 mg/dL   GFR calc non Af Amer 77 >59 mL/min/1.73   GFR calc Af Amer 89 >59 mL/min/1.73   BUN/Creatinine Ratio 13 9 - 20   Sodium 140 134 - 144 mmol/L   Potassium 4.2 3.5 - 5.2 mmol/L   Chloride 104 96 - 106 mmol/L   CO2 24 20 - 29 mmol/L   Calcium 9.4 8.7 - 10.2 mg/dL   Total Protein 6.7 6.0 - 8.5 g/dL   Albumin 4.4 3.8 - 4.9 g/dL    Comment:               **Please note reference interval change**   Globulin, Total 2.3 1.5 - 4.5 g/dL   Albumin/Globulin Ratio 1.9 1.2 - 2.2   Bilirubin Total 0.5 0.0 - 1.2 mg/dL   Alkaline Phosphatase 117 39 - 117 IU/L   AST 16 0 - 40 IU/L   ALT 24 0 - 44 IU/L  VITAMIN D 25 Hydroxy (Vit-D Deficiency,  Fractures)     Status: Abnormal   Collection Time: 09/12/18  8:16 AM  Result Value Ref Range   Vit D, 25-Hydroxy 21.7 (L) 30.0 - 100.0 ng/mL    Comment: Vitamin D deficiency has been defined by the Institute of Medicine and an Endocrine Society practice guideline as a level of serum 25-OH vitamin D less than 20 ng/mL (1,2). The Endocrine Society went on to further define vitamin D insufficiency as a level between 21 and 29 ng/mL (2). 1. IOM (Institute of Medicine). 2010. Dietary reference    intakes for calcium and D. [place]: The    [employer]. 2. Holick MF, Binkley [place], Bischoff-Ferrari HA, et al.    Evaluation, treatment, and prevention of vitamin D    deficiency: an Endocrine Society clinical practice    guideline. JCEM. 2011 Jul; 96(7):1911-30.   CBC with Differential/Platelet     Status: None   Collection Time: 09/12/18  8:16 AM  Result Value Ref Range   WBC 5.9 3.4 - 10.8 x10E3/uL   RBC 5.35 4.14 - 5.80 x10E6/uL   Hemoglobin 16.4 13.0 - 17.7 g/dL   Hematocrit 48.8 37.5 - 51.0 %   MCV 91 79 - 97 fL   MCH 30.7 26.6 - 33.0 pg   MCHC 33.6 31.5 - 35.7 g/dL   RDW 12.5 11.6 - 15.4 %   Platelets 212 150 - 450 x10E3/uL   Neutrophils 64 Not Estab. %   Lymphs 25 Not Estab. %   Monocytes 8 Not Estab. %   Eos 1 Not Estab. %   Basos 1 Not Estab. %   Neutrophils Absolute 3.9 1.4 - 7.0 x10E3/uL   Lymphocytes Absolute 1.5 0.7 - 3.1 x10E3/uL   Monocytes Absolute 0.5 0.1 -  0.9 x10E3/uL   EOS (ABSOLUTE) 0.1 0.0 - 0.4 x10E3/uL   Basophils Absolute 0.0 0.0 - 0.2 x10E3/uL   Immature Granulocytes 1 Not Estab. %   Immature Grans (Abs) 0.0 0.0 - 0.1 x10E3/uL

## 2018-09-19 NOTE — Patient Instructions (Signed)
Behavior Modification Ideas for Weight Management  Weight management involves adopting a healthy lifestyle that includes a knowledge of nutrition and exercise, a positive attitude and the right kind of motivation. Internal motives such as better health, increased energy, self-esteem and personal control increase your chances of lifelong weight management success.  Remember to have realistic goals and think long-term success. Believe in yourself and you can do it. The following information will give you ideas to help you meet your goals.  Control Your Home Environment  Eat only while sitting down at the kitchen or dining room table. Do not eat while watching television, reading, cooking, talking on the phone, standing at the refrigerator or working on the computer. Keep tempting foods out of the house - don't buy them. Keep tempting foods out of sight. Have low-calorie foods ready to eat. Unless you are preparing a meal, stay out of the kitchen. Have healthy snacks at your disposal, such as small pieces of fruit, vegetables, canned fruit, pretzels, low-fat string cheese and nonfat cottage cheese.  Control Your Work Environment  Do not eat at your desk or keep tempting snacks at your desk. If you get hungry between meals, plan healthy snacks and bring them with you to work. During your breaks, go for a walk instead of eating. If you work around food, plan in advance the one item you will eat at mealtime. Make it inconvenient to nibble on food by chewing gum, sugarless candy or drinking water or another low-calorie beverage. Do not work through meals. Skipping meals slows down metabolism and may result in overeating at the next meal. If food is available for special occasions, either pick the healthiest item, nibble on low-fat snacks brought from home, don't have anything offered, choose one option and have a small amount, or have only a beverage.  Control Your Mealtime Environment  Serve your  plate of food at the stove or kitchen counter. Do not put the serving dishes on the table. If you do put dishes on the table, remove them immediately when finished eating. Fill half of your plate with vegetables, a quarter with lean protein and a quarter with starch. Use smaller plates, bowls and glasses. A smaller portion will look large when it is in a little dish. Politely refuse second helpings. When fixing your plate, limit portions of food to one scoop/serving or less.   Daily Food Management  Replace eating with another activity that you will not associate with food. Wait 20 minutes before eating something you are craving. Drink a large glass of water or diet soda before eating. Always have a big glass or bottle of water to drink throughout the day. Avoid high-calorie add-ons such as cream with your coffee, butter, mayonnaise and salad dressings.  Shopping: Do not shop when hungry or tired. Shop from a list and avoid buying anything that is not on your list. If you must have tempting foods, buy individual-sized packages and try to find a lower-calorie alternative. Don't taste test in the store. Read food labels. Compare products to help you make the healthiest choices.  Preparation: Chew a piece of gum while cooking meals. Use a quarter teaspoon if you taste test your food. Try to only fix what you are going to eat, leaving yourself no chance for seconds. If you have prepared more food than you need, portion it into individual containers and freeze or refrigerate immediately. Don't snack while cooking meals.  Eating: Eat slowly. Remember it takes about 20 minutes   for your stomach to send a message to your brain that it is full. Don't let fake hunger make you think you need more. The ideal way to eat is to take a bite, put your utensil down, take a sip of water, cut your next bite, take a bit, put your utensil down and so on. Do not cut your food all at one time. Cut only as  needed. Take small bites and chew your food well. Stop eating for a minute or two at least once during a meal or snack. Take breaks to reflect and have conversation.  Cleanup and Leftovers: Label leftovers for a specific meal or snack. Freeze or refrigerate individual portions of leftovers. Do not clean up if you are still hungry.  Eating Out and Social Eating  Do not arrive hungry. Eat something light before the meal. Try to fill up on low-calorie foods, such as vegetables and fruit, and eat smaller portions of the high-calorie foods. Eat foods that you like, but choose small portions. If you want seconds, wait at least 20 minutes after you have eaten to see if you are actually hungry or if your eyes are bigger than your stomach. Limit alcoholic beverages. Try a soda water with a twist of lime. Do not skip other meals in the day to save room for the special event.  At Restaurants: Order  la carte rather than buffet style. Order some vegetables or a salad for an appetizer instead of eating bread. If you order a high-calorie dish, share it with someone. Try an after-dinner mint with your coffee. If you do have dessert, share it with two or more people. Don't overeat because you do not want to waste food. Ask for a doggie bag to take extra food home. Tell the server to put half of your entree in a to go bag before the meal is served to you. Ask for salad dressing, gravy or high-fat sauces on the side. Dip the tip of your fork in the dressing before each bite. If bread is served, ask for only one piece. Try it plain without butter or oil. At Italian restaurants where oil and vinegar is served with bread, use only a small amount of oil and a lot of vinegar for dipping.  At a Friend's House: Offer to bring a dish, appetizer or dessert that is low in calories. Serve yourself small portions or tell the host that you only want a small amount. Stand or sit away from the snack table. Stay away  from the kitchen or stay busy if you are near the food. Limit your alcohol intake.  At Buffets and Cafeterias: Cover most of your plate with lettuce and/or vegetables. Use a salad plate instead of a dinner plate. After eating, clear away your dishes before having coffee or tea.  Entertaining at Home: Explore low-fat, low-cholesterol cookbooks. Use single-serving foods like chicken breasts or hamburger patties. Prepare low-calorie appetizers and desserts.   Holidays: Keep tempting foods out of sight. Decorate the house without using food. Have low-calorie beverages and foods on hand for guests. Allow yourself one planned treat a day. Don't skip meals to save up for the holiday feast. Eat regular, planned meals.   Exercise Well  Make exercise a priority and a planned activity in the day. If possible, walk the entire or part of the distance to work. Get an exercise buddy. Go for a walk with a colleague during one of your breaks, go to the gym, run or   take a walk with a friend, walk in the mall with a shopping companion. Park at the end of the parking lot and walk to the store or office entrance. Always take the stairs all of the way or at least part of the way to your floor. If you have a desk job, walk around the office frequently. Do leg lifts while sitting at your desk. Do something outside on the weekends like going for a hike or a bike ride.   Have a Healthy Attitude  Make health your weight management priority. Be realistic. Have a goal to achieve a healthier you, not necessarily the lowest weight or ideal weight based on calculations or tables. Focus on a healthy eating style, not on dieting. Dieting usually lasts for a short amount of time and rarely produces long-term success. Think long term. You are developing new healthy behaviors to follow next month, in a year and in a decade.    This information is for educational purposes only and is not intended to replace the  advice of your doctor or health care provider. We encourage you to discuss with your doctor any questions or concerns you may have.        Guidelines for Losing Weight   We want weight loss that will last so you should lose 1-2 pounds a week.  THAT IS IT! Please pick THREE things a month to change. Once it is a habit check off the item. Then pick another three items off the list to become habits.  If you are already doing a habit on the list GREAT!  Cross that item off!  Don't drink your calories. Ie, alcohol, soda, fruit juice, and sweet tea.   Drink more water. Drink a glass when you feel hungry or before each meal.   Eat breakfast - Complex carb and protein (likeDannon light and fit yogurt, oatmeal, fruit, eggs, turkey bacon).  Measure your cereal.  Eat no more than one cup a day. (ie Kashi)  Eat an apple a day.  Add a vegetable a day.  Try a new vegetable a month.  Use Pam! Stop using oil or butter to cook.  Don't finish your plate or use smaller plates.  Share your dessert.  Eat sugar free Jello for dessert or frozen grapes.  Don't eat 2-3 hours before bed.  Switch to whole wheat bread, pasta, and brown rice.  Make healthier choices when you eat out. No fries!  Pick baked chicken, NOT fried.  Don't forget to SLOW DOWN when you eat. It is not going anywhere.   Take the stairs.  Park far away in the parking lot  Lift soup cans (or weights) for 10 minutes while watching TV.  Walk at work for 10 minutes during break.  Walk outside 1 time a week with your friend, kids, dog, or significant other.  Start a walking group at church.  Walk the mall as much as you can tolerate.   Keep a food diary.  Weigh yourself daily.  Walk for 15 minutes 3 days per week.  Cook at home more often and eat out less. If life happens and you go back to old habits, it is okay.  Just start over. You can do it!  If you experience chest pain, get short of breath, or tired  during the exercise, please stop immediately and inform your doctor.    Before you even begin to attack a weight-loss plan, it pays to remember this: You are not   fat. You have fat. Losing weight isn't about blame or shame; it's simply another achievement to accomplish. Dieting is like any other skill-you have to buckle down and work at it. As long as you act in a smart, reasonable way, you'll ultimately get where you want to be. Here are some weight loss pearls for you.   1. It's Not a Diet. It's a Lifestyle Thinking of a diet as something you're on and suffering through only for the short term doesn't work. To shed weight and keep it off, you need to make permanent changes to the way you eat. It's OK to indulge occasionally, of course, but if you cut calories temporarily and then revert to your old way of eating, you'll gain back the weight quicker than you can say yo-yo. Use it to lose it. Research shows that one of the best predictors of long-term weight loss is how many pounds you drop in the first month. For that reason, nutritionists often suggest being stricter for the first two weeks of your new eating strategy to build momentum. Cut out added sugar and alcohol and avoid unrefined carbs. After that, figure out how you can reincorporate them in a way that's healthy and maintainable.  2. There's a Right Way to Exercise Working out burns calories and fat and boosts your metabolism by building muscle. But those trying to lose weight are notorious for overestimating the number of calories they burn and underestimating the amount they take in. Unfortunately, your system is biologically programmed to hold on to extra pounds and that means when you start exercising, your body senses the deficit and ramps up its hunger signals. If you're not diligent, you'll eat everything you burn and then some. Use it, to lose it. Cardio gets all the exercise glory, but strength and interval training are the real heroes.  They help you build lean muscle, which in turn increases your metabolism and calorie-burning ability 3. Don't Overreact to Mild Hunger Some people have a hard time losing weight because of hunger anxiety. To them, being hungry is bad-something to be avoided at all costs-so they carry snacks with them and eat when they don't need to. Others eat because they're stressed out or bored. While you never want to get to the point of being ravenous (that's when bingeing is likely to happen), a hunger pang, a craving, or the fact that it's 3:00 p.m. should not send you racing for the vending machine or obsessing about the energy bar in your purse. Ideally, you should put off eating until your stomach is growling and it's difficult to concentrate.  Use it to lose it. When you feel the urge to eat, use the HALT method. Ask yourself, Am I really hungry? Or am I angry or anxious, lonely or bored, or tired? If you're still not certain, try the apple test. If you're truly hungry, an apple should seem delicious; if it doesn't, something else is going on. Or you can try drinking water and making yourself busy, if you are still hungry try a healthy snack.  4. Not All Calories Are Created Equal The mechanics of weight loss are pretty simple: Take in fewer calories than you use for energy. But the kind of food you eat makes all the difference. Processed food that's high in saturated fat and refined starch or sugar can cause inflammation that disrupts the hormone signals that tell your brain you're full. The result: You eat a lot more.  Use it to lose   it. Clean up your diet. Swap in whole, unprocessed foods, including vegetables, lean protein, and healthy fats that will fill you up and give you the biggest nutritional bang for your calorie buck. In a few weeks, as your brain starts receiving regular hunger and fullness signals once again, you'll notice that you feel less hungry overall and naturally start cutting back on the amount  you eat.  5. Protein, Produce, and Plant-Based Fats Are Your Weight-Loss Trinity Here's why eating the three Ps regularly will help you drop pounds. Protein fills you up. You need it to build lean muscle, which keeps your metabolism humming so that you can torch more fat. People in a weight-loss program who ate double the recommended daily allowance for protein (about 110 grams for a 150-pound woman) lost 70 percent of their weight from fat, while people who ate the RDA lost only about 40 percent, one study found. Produce is packed with filling fiber. "It's very difficult to consume too many calories if you're eating a lot of vegetables. Example: Three cups of broccoli is a lot of food, yet only 93 calories. (Fruit is another story. It can be easy to overeat and can contain a lot of calories from sugar, so be sure to monitor your intake.) Plant-based fats like olive oil and those in avocados and nuts are healthy and extra satiating.  Use it to lose it. Aim to incorporate each of the three Ps into every meal and snack. People who eat protein throughout the day are able to keep weight off, according to a study in the American Journal of Clinical Nutrition. In addition to meat, poultry and seafood, good sources are beans, lentils, eggs, tofu, and yogurt. As for fat, keep portion sizes in check by measuring out salad dressing, oil, and nut butters (shoot for one to two tablespoons). Finally, eat veggies or a little fruit at every meal. People who did that consumed 308 fewer calories but didn't feel any hungrier than when they didn't eat more produce.  7. How You Eat Is As Important As What You Eat In order for your brain to register that you're full, you need to focus on what you're eating. Sit down whenever you eat, preferably at a table. Turn off the TV or computer, put down your phone, and look at your food. Smell it. Chew slowly, and don't put another bite on your fork until you swallow. When women ate  lunch this attentively, they consumed 30 percent less when snacking later than those who listened to an audiobook at lunchtime, according to a study in the [place] Journal of Nutrition. 8. Weighing Yourself Really Works The scale provides the best evidence about whether your efforts are paying off. Seeing the numbers tick up or down or stagnate is motivation to keep going-or to rethink your approach. A 2015 study at [employer] found that daily weigh-ins helped people lose more weight, keep it off, and maintain that loss, even after two years. Use it to lose it. Step on the scale at the same time every day for the best results. If your weight shoots up several pounds from one weigh-in to the next, don't freak out. Eating a lot of salt the night before or having your period is the likely culprit. The number should return to normal in a day or two. It's a steady climb that you need to do something about. 9. Too Much Stress and Too Little Sleep Are Your Enemies When you're tired and frazzled,   your body cranks up the production of cortisol, the stress hormone that can cause carb cravings. Not getting enough sleep also boosts your levels of ghrelin, a hormone associated with hunger, while suppressing leptin, a hormone that signals fullness and satiety. People on a diet who slept only five and a half hours a night for two weeks lost 55 percent less fat and were hungrier than those who slept eight and a half hours, according to a study in the [employer] Journal. Use it to lose it. Prioritize sleep, aiming for seven hours or more a night, which research shows helps lower stress. And make sure you're getting quality zzz's. If a snoring spouse or a fidgety cat wakes you up frequently throughout the night, you may end up getting the equivalent of just four hours of sleep, according to a study from [employer]. Keep pets out of the bedroom, and use a white-noise app to drown out  snoring. 10. You Will Hit a plateau-And You Can Bust Through It As you slim down, your body releases much less leptin, the fullness hormone.  If you're not strength training, start right now. Building muscle can raise your metabolism to help you overcome a plateau. To keep your body challenged and burning calories, incorporate new moves and more intense intervals into your workouts or add another sweat session to your weekly routine. Alternatively, cut an extra 100 calories or so a day from your diet. Now that you've lost weight, your body simply doesn't need as much fuel.    Since food equals calories, in order to lose weight you must either eat fewer calories, exercise more to burn off calories with activity, or both. Food that is not used to fuel the body is stored as fat. A major component of losing weight is to make smarter food choices. Here's how:  1)   Limit non-nutritious foods, such as: Sugar, honey, syrups and candy Pastries, donuts, pies, cakes and cookies Soft drinks, sweetened juices and alcoholic beverages  2)  Cut down on high-fat foods by: - Choosing poultry, fish or lean red meat - Choosing low-fat cooking methods, such as baking, broiling, steaming, grilling and boiling - Using low-fat or non-fat dairy products - Using vinaigrette, herbs, lemon or fat-free salad dressings - Avoiding fatty meats, such as bacon, sausage, franks, ribs and luncheon meats - Avoiding high-fat snacks like nuts, chips and chocolate - Avoiding fried foods - Using less butter, margarine, oil and mayonnaise - Avoiding high-fat gravies, cream sauces and cream-based soups  3) Eat a variety of foods, including: - Fruit and vegetables that are raw, steamed or baked - Whole grains, breads, cereal, rice and pasta - Dairy products, such as low-fat or non-fat milk or yogurt, low-fat cottage cheese and low-fat cheese - Protein-rich foods like chicken, turkey, fish, lean meat and legumes, or beans  4)  Change your eating habits by: - Eat three balanced meals a day to help control your hunger - Watch portion sizes and eat small servings of a variety of foods - Choose low-calorie snacks - Eat only when you are hungry and stop when you are satisfied - Eat slowly and try not to perform other tasks while eating - Find other activities to distract you from food, such as walking, taking up a hobby or being involved in the community - Include regular exercise in your daily routine ( minimum of 20 min of moderate-intensity exercise at least 5 days/week)  - Find a support group,   if necessary, for emotional support in your weight loss journey           Easy ways to cut 100 calories   1. Eat your eggs with hot sauce OR salsa instead of cheese.  Eggs are great for breakfast, but many people consider eggs and cheese to be BFFs. Instead of cheese-1 oz. of cheddar has 114 calories-top your eggs with hot sauce, which contains no calories and helps with satiety and metabolism. Salsa is also a great option!!  2. Top your toast, waffles or pancakes with fresh berries instead of jelly or syrup. Half a cup of berries-fresh, frozen or thawed-has about 40 calories, compared with 2 tbsp. of maple syrup or jelly, which both have about 100 calories. The berries will also give you a good punch of fiber, which helps keep you full and satisfied and won't spike blood sugar quickly like the jelly or syrup. 3. Swap the non-fat latte for black coffee with a splash of half-and-half. Contrary to its name, that non-fat latte has 130 calories and a startling 19g of carbohydrates per 16 oz. serving. Replacing that 'light' drinkable dessert with a black coffee with a splash of half-and-half saves you more than 100 calories per 16 oz. serving. 4. Sprinkle salads with freeze-dried raspberries instead of dried cranberries. If you want a sweet addition to your nutritious salad, stay away from dried cranberries. They have a  whopping 130 calories per  cup and 30g carbohydrates. Instead, sprinkle freeze-dried raspberries guilt-free and save more than 100 calories per  cup serving, adding 3g of belly-filling fiber. 5. Go for mustard in place of mayo on your sandwich. Mustard can add really nice flavor to any sandwich, and there are tons of varieties, from spicy to honey. A serving of mayo is 95 calories, versus 10 calories in a serving of mustard.  Or try an avocado mayo spread: You can find the recipe few click this link: https://www.californiaavocado.com/recipes/recipe-container/[place]ifornia-avocado-mayo 6. Choose a DIY salad dressing instead of the store-bought kind. Mix Dijon or whole grain mustard with low-fat Kefir or red wine vinegar and garlic. 7. Use hummus as a spread instead of a dip. Use hummus as a spread on a high-fiber cracker or tortilla with a sandwich and save on calories without sacrificing taste. 8. Pick just one salad "accessory." Salad isn't automatically a calorie winner. It's easy to over-accessorize with toppings. Instead of topping your salad with nuts, avocado and cranberries (all three will clock in at 313 calories), just pick one. The next day, choose a different accessory, which will also keep your salad interesting. You don't wear all your jewelry every day, right? 9. Ditch the white pasta in favor of spaghetti squash. One cup of cooked spaghetti squash has about 40 calories, compared with traditional spaghetti, which comes with more than 200. Spaghetti squash is also nutrient-dense. It's a good source of fiber and Vitamins A and C, and it can be eaten just like you would eat pasta-with a great tomato sauce and turkey meatballs or with pesto, tofu and spinach, for example. 10. Dress up your chili, soups and stews with non-fat Greek yogurt instead of sour cream. Just a 'dollop' of sour cream can set you back 115 calories and a whopping 12g of fat-seven of which are of the artery-clogging  variety. Added bonus: Greek yogurt is packed with muscle-building protein, calcium and B Vitamins. 11. Mash cauliflower instead of mashed potatoes. One cup of traditional mashed potatoes-in all their creamy goodness-has more than 200   calories, compared to mashed cauliflower, which you can typically eat for less than 100 calories per 1 cup serving. Cauliflower is a great source of the antioxidant indole-3-carbinol (I3C), which may help reduce the risk of some cancers, like breast cancer. 12. Ditch the ice cream sundae in favor of a [place] yogurt parfait. Instead of a cup of ice cream or fro-yo for dessert, try 1 cup of nonfat Greek yogurt topped with fresh berries and a sprinkle of cacao nibs. Both toppings are packed with antioxidants, which can help reduce cellular inflammation and oxidative damage. And the comparison is a no-brainer: One cup of ice cream has about 275 calories; one cup of frozen yogurt has about 230; and a cup of Greek yogurt has just 130, plus twice the protein, so you're less likely to return to the freezer for a second helping. 13. Put olive oil in a spray container instead of using it directly from the bottle. Each tablespoon of olive oil is 120 calories and 15g of fat. Use a mister instead of pouring it straight into the pan or onto a salad. This allows for portion control and will save you more than 100 calories. 14. When baking, substitute canned pumpkin for butter or oil. Canned pumpkin-not pumpkin pie mix-is loaded with Vitamin A, which is important for skin and eye health, as well as immunity. And the comparisons are pretty crazy:  cup of canned pumpkin has about 40 calories, compared to butter or oil, which has more than 800 calories. Yes, 800 calories. Applesauce and mashed banana can also serve as good substitutions for butter or oil, usually in a 1:1 ratio. 15. Top casseroles with high-fiber cereal instead of breadcrumbs. Breadcrumbs are typically made with white bread,  while breakfast cereals contain 5-9g of fiber per serving. Not only will you save more than 150 calories per  cup serving, the swap will also keep you more full and you'll get a metabolism boost from the added fiber. 16. Snack on pistachios instead of macadamia nuts. Believe it or not, you get the same amount of calories from 35 pistachios (100 calories) as you would from only five macadamia nuts. 17. Chow down on kale chips rather than potato chips. This is my favorite 'don't knock it 'till you try it' swap. Kale chips are so easy to make at home, and you can spice them up with a little grated parmesan or chili powder. Plus, they're a mere fraction of the calories of potato chips, but with the same crunch factor we crave so often. 18. Add seltzer and some fruit slices to your cocktail instead of soda or fruit juice. One cup of soda or fruit juice can pack on as much as 140 calories. Instead, use seltzer and fruit slices. The fruit provides valuable phytochemicals, such as flavonoids and anthocyanins, which help to combat cancer and stave off the aging process.        Fatigue If you have fatigue, you feel tired all the time and have a lack of energy or a lack of motivation. Fatigue may make it difficult to start or complete tasks because of exhaustion. In general, occasional or mild fatigue is often a normal response to activity or life. However, long-lasting (chronic) or extreme fatigue may be a symptom of a medical condition. Follow these instructions at home: General instructions  Watch your fatigue for any changes.  Go to bed and get up at the same time every day.  Avoid fatigue by pacing yourself during the  day and getting enough sleep at night.  Maintain a healthy weight. Medicines  Take over-the-counter and prescription medicines only as told by your health care provider.  Take a multivitamin, if told by your health care provider.  Do not use herbal or dietary supplements  unless they are approved by your health care provider. Activity   Exercise regularly, as told by your health care provider.  Use or practice techniques to help you relax, such as yoga, tai chi, meditation, or massage therapy. Eating and drinking   Avoid heavy meals in the evening.  Eat a well-balanced diet, which includes lean proteins, whole grains, plenty of fruits and vegetables, and low-fat dairy products.  Avoid consuming too much caffeine.  Avoid the use of alcohol.  Drink enough fluid to keep your urine pale yellow. Lifestyle  Change situations that cause you stress. Try to keep your work and personal schedule in balance.  Do not use any products that contain nicotine or tobacco, such as cigarettes and e-cigarettes. If you need help quitting, ask your health care provider.  Do not use drugs. Contact a health care provider if:  Your fatigue does not get better.  You have a fever.  You suddenly lose or gain weight.  You have headaches.  You have trouble falling asleep or sleeping through the night.  You feel angry, guilty, anxious, or sad.  You are unable to have a bowel movement (constipation).  Your skin is dry.  You have swelling in your legs or another part of your body. Get help right away if:  You feel confused.  Your vision is blurry.  You feel faint or you pass out.  You have a severe headache.  You have severe pain in your abdomen, your back, or the area between your waist and hips (pelvis).  You have chest pain, shortness of breath, or an irregular or fast heartbeat.  You are unable to urinate, or you urinate less than normal.  You have abnormal bleeding, such as bleeding from the rectum, vagina, nose, lungs, or nipples.  You vomit blood.  You have thoughts about hurting yourself or others. If you ever feel like you may hurt yourself or others, or have thoughts about taking your own life, get help right away. You can go to your nearest  emergency department or call:  Your local emergency services (911 in the [place]  A suicide crisis helpline, such as the [facility] at (680)867-7151. This is open 24 hours a day. Summary  If you have fatigue, you feel tired all the time and have a lack of energy or a lack of motivation.  Fatigue may make it difficult to start or complete tasks because of exhaustion.  Long-lasting (chronic) or extreme fatigue may be a symptom of a medical condition.  Exercise regularly, as told by your health care provider.  Change situations that cause you stress. Try to keep your work and personal schedule in balance. This information is not intended to replace advice given to you by your health care provider. Make sure you discuss any questions you have with your health care provider. Document Released: 05/31/2007 Document Revised: 04/28/2017 Document Reviewed: 04/28/2017 Elsevier Interactive Patient Education  2019 [place] refers to the changes in your body that occur during a period of inactivity. The changes happen in your heart, lungs, and muscles. They decrease your ability to be active, and they make you feel tired and weak. There are  three stages of deconditioning:  Mild deconditioning. At this stage, you will notice a change in your ability to do your usual exercise activities, such as running, biking, or swimming.  Moderate deconditioning. At this stage, you will notice a change in your ability to do normal everyday activities, such as walking, grocery shopping, and doing chores.  Severe deconditioning. At this stage, you will notice a change in your ability to do minimal activity or normal self-care. Deconditioning can occur after only a few days of inactivity. The longer the period of inactivity, the more severe the deconditioning will be, and the longer it will take to return to your previous level of functioning. What  are the causes? Deconditioning is often caused by inactivity due to:  Illnesses, such as cancer, stroke, heart attack, fibromyalgia, and chronic fatigue syndrome.  Injuries, especially back injuries, broken bones, and ligament and tendon injuries.  A long stay in the hospital.  Pregnancy, especially if long periods of bed rest are needed. What increases the risk? This condition is more likely to develop in:  People who are hospitalized.  People on bed rest.  People who are obese.  People with poor nutrition.  Elderly adults.  People with injuries or illnesses that interfere with movement and activity. What are the signs or symptoms? Symptoms of deconditioning include:  Weakness.  Tiredness.  Shortness of breath with minor exertion.  A faster-than-normal heartbeat. You may not notice this without taking your pulse.  Pain or discomfort with activity.  Decreased strength.  Decreased sense of balance.  Decreased endurance.  Difficulty doing your usual forms of exercise.  Difficulty doing activities of daily living, such as grocery shopping or chores.  Difficulty walking around the house and doing basic self-care, such as getting to the bathroom, preparing meals, or doing laundry. How is this diagnosed? Deconditioning is diagnosed based on your medical history and a physical exam. During the physical exam, your health care provider will check for signs of deconditioning, such as:  Decreased size of muscles.  Decreased strength.  Trouble with balance.  Shortness of breath or abnormally increased heart rate after minor exertion. How is this treated? Treatment for deconditioning usually involves following a structured exercise program in which activity is increased gradually. Your health care provider will determine which exercises are right for you. The exercise program will likely include aerobic exercise and strength training:  Aerobic exercise helps improve  the functioning of the heart and lungs as well as the muscles.  Strength training helps improve muscle size and strength. Both of these types of exercise will improve your endurance. You may be referred to a physical therapist who can create a safe strengthening program for you to follow. Follow these instructions at home:  Follow the exercise program that is recommended by your health care provider or physical therapist.  Do not increase your exercise any faster than directed.  Eat a healthy diet.  Do not use any products that contain nicotine or tobacco, such as cigarettes and e-cigarettes. If you need help quitting, ask your health care provider.  Take over-the-counter and prescription medicines only as told by your health care provider.  Keep all follow-up visits as told by your health care provider. This is important. Contact a health care provider if:  You are not able to carry out the prescribed exercise program.  You are becoming more and more fatigued and weak.  You become light-headed when rising to a sitting or standing position.  Your level  of endurance decreases after it has improved. Get help right away if:  You have chest pain.  You are very short of breath.  You have any episodes of passing out. This information is not intended to replace advice given to you by your health care provider. Make sure you discuss any questions you have with your health care provider. Document Released: 12/18/2013 Document Revised: 02/21/2016 Document Reviewed: 11/02/2015 Elsevier Interactive Patient Education  2019 [employer]       Vitamin D Deficiency Vitamin D deficiency is when your body does not have enough vitamin D. Vitamin D is important to your body for many reasons:  It helps the body to absorb two important minerals, called calcium and phosphorus.  It plays a role in bone health.  It may help to prevent some diseases, such as diabetes and multiple  sclerosis.  It plays a role in muscle function, including heart function. You can get vitamin D by:  Eating foods that naturally contain vitamin D.  Eating or drinking milk or other dairy products that have vitamin D added to them.  Taking a vitamin D supplement or a multivitamin supplement that contains vitamin D.  Being in the sun. Your body naturally makes vitamin D when your skin is exposed to sunlight. Your body changes the sunlight into a form of the vitamin that the body can use. If vitamin D deficiency is severe, it can cause a condition in which your bones become soft. In adults, this condition is called osteomalacia. In children, this condition is called rickets. What are the causes? Vitamin D deficiency may be caused by:  Not eating enough foods that contain vitamin D.  Not getting enough sun exposure.  Having certain digestive system diseases that make it difficult for your body to absorb vitamin D. These diseases include Crohn disease, chronic pancreatitis, and cystic fibrosis.  Having a surgery in which a part of the stomach or a part of the small intestine is removed.  Being obese.  Having chronic kidney disease or liver disease. What increases the risk? This condition is more likely to develop in:  Older people.  People who do not spend much time outdoors.  People who live in a long-term care facility.  People who have had broken bones.  People with weak or thin bones (osteoporosis).  People who have a disease or condition that changes how the body absorbs vitamin D.  People who have dark skin.  People who take certain medicines, such as steroid medicines or certain seizure medicines.  People who are overweight or obese. What are the signs or symptoms? In mild cases of vitamin D deficiency, there may not be any symptoms. If the condition is severe, symptoms may include:  Bone pain.  Muscle pain.  Falling often.  Broken bones caused by a minor  injury. How is this diagnosed? This condition is usually diagnosed with a blood test. How is this treated? Treatment for this condition may depend on what caused the condition. Treatment options include:  Taking vitamin D supplements.  Taking a calcium supplement. Your health care provider will suggest what dose is best for you. Follow these instructions at home:  Take medicines and supplements only as told by your health care provider.  Eat foods that contain vitamin D. Choices include: ? Fortified dairy products, cereals, or juices. Fortified means that vitamin D has been added to the food. Check the label on the package to be sure. ? Fatty fish, such as salmon  or trout. ? Eggs. ? Oysters.  Do not use a tanning bed.  Maintain a healthy weight. Lose weight, if needed.  Keep all follow-up visits as told by your health care provider. This is important. Contact a health care provider if:  Your symptoms do not go away.  You feel like throwing up (nausea) or you throw up (vomit).  You have fewer bowel movements than usual or it is difficult for you to have a bowel movement (constipation). This information is not intended to replace advice given to you by your health care provider. Make sure you discuss any questions you have with your health care provider. Document Released: 10/26/2011 Document Revised: 01/15/2016 Document Reviewed: 12/19/2014 Elsevier Interactive Patient Education  2019 [employer].

## 2018-10-10 ENCOUNTER — Encounter: Payer: Self-pay | Admitting: Adult Health

## 2018-10-10 ENCOUNTER — Ambulatory Visit: Payer: 59 | Admitting: Adult Health

## 2018-10-10 VITALS — BP 113/69 | HR 69 | Temp 98.4°F | Ht 70.0 in | Wt 224.8 lb

## 2018-10-10 DIAGNOSIS — R6889 Other general symptoms and signs: Secondary | ICD-10-CM | POA: Diagnosis not present

## 2018-10-10 LAB — POCT INFLUENZA A/B
Influenza A, POC: NEGATIVE
Influenza B, POC: NEGATIVE

## 2018-10-10 MED ORDER — BENZONATATE 200 MG PO CAPS: 200.0000 mg | ORAL_CAPSULE | Freq: Two times a day (BID) | ORAL | 0 refills | Status: DC | PRN

## 2018-10-10 MED ORDER — OSELTAMIVIR PHOSPHATE 75 MG PO CAPS: 75.0000 mg | ORAL_CAPSULE | Freq: Two times a day (BID) | ORAL | 0 refills | Status: DC

## 2018-10-10 NOTE — Progress Notes (Signed)
Subjective:    Patient ID: Brian Gay, male    DOB: 1965/11/29, 53 y.o.   MRN: [id number]  HPI:  Brian Gay presents with productive cough (white mucus), bloody nasal drainage, sore throat (3/10), fever (highest 100), chills, and diarrhea that all started abruptly 24 hrs ago, Brian Gay states "hit me like a ton of bricks".  Brian Gay reports several co-workers have tested + with flu and has been to local hospital several times with Brian Gay ill, elderly father the last 6 weeks. Brian Gay did not receive the influenza vaccination this year. Brian Gay denies N/V, chills, CP/dyspnea Brian Gay has not taken any OTC remedies Current temp 98.29f oral Brian Gay denies tobacco/vape use  CMP 08/2018, GFR >60  Patient Care Team    Relationship Specialty Notifications Start End  [name], DO PCP - General Family Medicine  08/04/16   Coolidge Breeze, MD Consulting Physician Dentistry  12/11/15   Daryll Brod, [place] Physician Orthopedic Surgery  01/26/17     Patient Active Problem List   Diagnosis Date Noted  . Flu-like symptoms 10/10/2018  . Obesity, Class I, BMI 30-34.9 09/19/2018  . Hyperlipidemia 05/12/2018  . Acute bronchitis 11/25/2017  . Acute maxillary sinusitis 11/25/2017  . Wheezing 11/25/2017  . Sinusitis 06/02/2017  . Stress and adjustment reaction 06/02/2017  . Low HDL (under 40) 01/26/2017  . Vitamin D deficiency 09/08/2016  . Tobacco abuse counseling 09/05/2016  . Elevated LDL cholesterol level 08/29/2016  . h/o Tobacco use disorder 08/04/2016  . Obese 08/04/2016  . Environmental and seasonal allergies 08/04/2016  . Fatigue- esp AM 08/04/2016  . Closed displaced fracture of middle phalanx of right ring finger 03/16/2016  . Volar plate injury of finger 03/16/2016  . Wellness examination 12/12/2015  . Essential hypertension 10/10/2015  . Allergic rhinitis 11/02/2012     Past Medical History:  Diagnosis Date  . Allergy   . Hypertension   . Kidney stones      Past Surgical History:  Procedure  Laterality Date  . CLOSED REDUCTION FINGER WITH PERCUTANEOUS PINNING Right 03/19/2016   Procedure: CLOSED REDUCTION right ring  FINGER;  Surgeon: Daryll Brod, MD;  Location: [facility];  Service: Orthopedics;  Laterality: Right;  . cyst removed      pilonidal cyst  . WISDOM TOOTH EXTRACTION       Family History  Problem Relation Age of Onset  . Heart disease Mother   . Hypertension Mother   . Heart disease Father   . Cancer Father        prostate  . Prostate cancer Father   . Hyperlipidemia Father   . Stroke Father   . Heart disease Maternal Grandmother   . Heart disease Maternal Grandfather   . Heart disease Paternal Grandmother   . Heart disease Paternal Grandfather   . Colon cancer Neg Hx   . Colon polyps Neg Hx   . Rectal cancer Neg Hx   . Stomach cancer Neg Hx      Social History   Substance and Sexual Activity  Drug Use No     Social History   Substance and Sexual Activity  Alcohol Use Yes  . Alcohol/week: 0.0 standard drinks   Comment: occasional     Social History   Tobacco Use  Smoking Status Former Smoker  . Packs/day: 0.50  . Years: 20.00  . Pack years: 10.00  . Last attempt to quit: 07/2016  . Years since quitting: 2.2  Smokeless Tobacco Current User  .  Types: Chew     Outpatient Encounter Medications as of 10/10/2018  Medication Sig  . atorvastatin (LIPITOR) 80 MG tablet 1 tablet nightly before bedtime  . Cholecalciferol (VITAMIN D3) 5000 units TABS 5,000 IU OTC vitamin D3 daily.  Marland Kitchen FLUoxetine (PROZAC) 20 MG tablet Take 2 tablets (40 mg total) by mouth daily.  Marland Kitchen losartan-hydrochlorothiazide (HYZAAR) 100-12.5 MG tablet Take 1 tablet by mouth daily.  . Vitamin D, Ergocalciferol, (DRISDOL) 1.25 MG (50000 UT) CAPS capsule 1 tab wkly  . benzonatate (TESSALON) 200 MG capsule Take 1 capsule (200 mg total) by mouth 2 (two) times daily as needed for cough.  Marland Kitchen oseltamivir (TAMIFLU) 75 MG capsule Take 1 capsule (75 mg total) by mouth 2  (two) times daily.   No facility-administered encounter medications on file as of 10/10/2018.     Allergies: Codeine  Body mass index is 32.26 kg/m.  Blood pressure 113/69, pulse 69, temperature 98.4 F (36.9 C), temperature source Oral, height 5\' 10"  (1.778 m), weight 224 lb 12.8 oz (102 kg), SpO2 97 %.  Review of Systems  Constitutional: Positive for activity change, appetite change, fatigue and fever. Negative for chills, diaphoresis and unexpected weight change.  HENT: Positive for congestion, postnasal drip, rhinorrhea, sinus pressure, sinus pain, sneezing and sore throat. Negative for ear discharge, ear pain, facial swelling, trouble swallowing and voice change.   Eyes: Negative for visual disturbance.  Respiratory: Positive for cough. Negative for chest tightness, shortness of breath and wheezing.   Cardiovascular: Negative for chest pain, palpitations and leg swelling.  Gastrointestinal: Positive for diarrhea. Negative for abdominal distention, abdominal pain, blood in stool, constipation, nausea and vomiting.  Endocrine: Negative for cold intolerance, heat intolerance, polydipsia, polyphagia and polyuria.  Genitourinary: Negative for difficulty urinating and flank pain.  Musculoskeletal: Positive for myalgias.  Skin: Negative for color change, pallor, rash and wound.  Neurological: Negative for dizziness and headaches.  Hematological: Does not bruise/bleed easily.  Psychiatric/Behavioral: Positive for sleep disturbance.       Objective:   Physical Exam Constitutional:      Appearance: Brian Gay is normal weight. Brian Gay is ill-appearing and diaphoretic. Brian Gay is not toxic-appearing.  HENT:     Head: Normocephalic and atraumatic.     Right Ear: Tympanic membrane, ear canal and external ear normal. There is no impacted cerumen.     Left Ear: Tympanic membrane, ear canal and external ear normal. There is no impacted cerumen.     Nose: Congestion and rhinorrhea present.     Mouth/Throat:      Pharynx: Posterior oropharyngeal erythema present. No oropharyngeal exudate.  Eyes:     Extraocular Movements: Extraocular movements intact.     Conjunctiva/sclera: Conjunctivae normal.     Pupils: Pupils are equal, round, and reactive to light.  Neck:     Musculoskeletal: Normal range of motion.  Cardiovascular:     Rate and Rhythm: Normal rate.     Pulses: Normal pulses.     Heart sounds: No murmur. No friction rub. No gallop.   Pulmonary:     Effort: Pulmonary effort is normal. No respiratory distress.     Breath sounds: Normal breath sounds. No stridor. No wheezing, rhonchi or rales.  Chest:     Chest wall: No tenderness.  Lymphadenopathy:     Cervical: No cervical adenopathy.  Skin:    General: Skin is warm.     Capillary Refill: Capillary refill takes less than 2 seconds.     Comments: Very warm to the touch  Neurological:     Mental Status: Brian Gay is alert and oriented to person, place, and time.  Psychiatric:        Mood and Affect: Mood normal.        Behavior: Behavior normal.        Thought Content: Thought content normal.        Judgment: Judgment normal.       Assessment & Plan:   1. Flu-like symptoms     Flu-like symptoms Flu test Negative, however your symptoms are so consistent with flu, will treat with Tamiflu-take as directed. Please take Teasslon as needed. Increase fluids, rest, vit c-2,000mg /day when not feeling well. Work Excuse provided, okay to return Monday 10/17/2018.    FOLLOW-UP:  Return if symptoms worsen or fail to improve.

## 2018-10-10 NOTE — Patient Instructions (Addendum)

## 2018-10-10 NOTE — Assessment & Plan Note (Signed)
Flu test Negative, however your symptoms are so consistent with flu, will treat with Tamiflu-take as directed. Please take Teasslon as needed. Increase fluids, rest, vit c-2,000mg /day when not feeling well. Work Excuse provided, okay to return Monday 10/17/2018.

## 2019-01-18 ENCOUNTER — Ambulatory Visit: Payer: Self-pay | Admitting: Family Medicine

## 2019-02-15 ENCOUNTER — Encounter: Payer: Self-pay | Admitting: Family Medicine

## 2019-02-15 ENCOUNTER — Ambulatory Visit: Payer: 59 | Admitting: Family Medicine

## 2019-02-15 ENCOUNTER — Other Ambulatory Visit: Payer: Self-pay

## 2019-02-15 VITALS — BP 109/70 | HR 61 | Temp 98.2°F | Ht 70.0 in | Wt 213.2 lb

## 2019-02-15 DIAGNOSIS — H04123 Dry eye syndrome of bilateral lacrimal glands: Secondary | ICD-10-CM

## 2019-02-15 DIAGNOSIS — F4329 Adjustment disorder with other symptoms: Secondary | ICD-10-CM

## 2019-02-15 DIAGNOSIS — F17200 Nicotine dependence, unspecified, uncomplicated: Secondary | ICD-10-CM

## 2019-02-15 DIAGNOSIS — H00015 Hordeolum externum left lower eyelid: Secondary | ICD-10-CM | POA: Diagnosis not present

## 2019-02-15 DIAGNOSIS — H1089 Other conjunctivitis: Secondary | ICD-10-CM | POA: Diagnosis not present

## 2019-02-15 DIAGNOSIS — I10 Essential (primary) hypertension: Secondary | ICD-10-CM | POA: Diagnosis not present

## 2019-02-15 MED ORDER — RESTASIS 0.05 % OP EMUL: 1.0000 [drp] | Freq: Two times a day (BID) | OPHTHALMIC | 1 refills | Status: DC

## 2019-02-15 MED ORDER — OLOPATADINE HCL 0.2 % OP SOLN: OPHTHALMIC | 1 refills | Status: DC

## 2019-02-15 MED ORDER — ERYTHROMYCIN 5 MG/GM OP OINT: 1.0000 "application " | TOPICAL_OINTMENT | Freq: Three times a day (TID) | OPHTHALMIC | 0 refills | Status: AC

## 2019-02-15 MED ORDER — LOSARTAN POTASSIUM-HCTZ 100-12.5 MG PO TABS: 0.5000 | ORAL_TABLET | Freq: Every day | ORAL | 1 refills | Status: DC

## 2019-02-15 NOTE — Progress Notes (Signed)
Impression and Recommendations:    1. Dry eye syndrome of both eyes   2. Essential hypertension   3. Other conjunctivitis of both eyes   4. Hordeolum externum of left lower eyelid   5. Stress and adjustment reaction   6. h/o Tobacco use disorder     Dry eye syndrome of both eyes - Plan: cycloSPORINE (RESTASIS) 0.05 % ophthalmic emulsion, Anaerobic and Aerobic Culture   Procedure:  Incision and drainage of eye abscess Risks, benefits, and alternatives explained and consent obtained. Surface cleaned with alcohol or chlorhexidine * 3 No anesthesia needed 18G sterile needle used to make a stab incision into abscess. A lot of pus expressed with mild pressure. Cultures obtained Hemostasis achieved. Sterile dressing placed EBL: Less than 1 mL Pt tolerated procedure well; stable. Wound care, aftercare with drops and ABX ointment and follow-up advised.   All questions were answered.   Essential hypertension - Plan: losartan-hydrochlorothiazide (HYZAAR) 100-12.5 MG tablet,  Pt declines change in dose at this time.  He will monitor at home and let us know if issues/ concerns   Other conjunctivitis of both eyes - Plan: Olopatadine HCl 0.2 % SOLN, Anaerobic and Aerobic Culture,  Needs eye protection   Hordeolum externum of left lower eyelid - Plan: Anaerobic and Aerobic Culture   Stress and adjustment reaction - Plan:  Cont current med dose per pt preference at this time,  He will let us know if it worsens   h/o Tobacco use disorder - Plan: encouraged to quit.  Not ready emotionally.    Orders Placed This Encounter  Procedures  . Anaerobic and Aerobic Culture    Meds ordered this encounter  Medications  . losartan-hydrochlorothiazide (HYZAAR) 100-12.5 MG tablet    Sig: Take 0.5 tablets by mouth daily.    Dispense:  45 tablet    Refill:  1  . Olopatadine HCl 0.2 % SOLN    Sig: Apply 1 drop to each eye daily prn itch    Dispense:  10 mL    Refill:  1  .  cycloSPORINE (RESTASIS) 0.05 % ophthalmic emulsion    Sig: Place 1 drop into both eyes 2 (two) times daily.    Dispense:  0.4 mL    Refill:  1  . erythromycin ophthalmic ointment    Sig: Place 1 application into the left eye 3 (three) times daily for 7 days. Apply 1 inch ribbon to affected eye TID for 5 days.    Dispense:  21 g    Refill:  0    Medications Discontinued During This Encounter  Medication Reason  . oseltamivir (TAMIFLU) 75 MG capsule Completed Course  . benzonatate (TESSALON) 200 MG capsule Completed Course  . losartan-hydrochlorothiazide (HYZAAR) 100-12.5 MG tablet      Gross side effects, risk and benefits, and alternatives of medications and treatment plan in general discussed with patient.  Patient is aware that all medications have potential side effects and we are unable to predict every side effect or drug-drug interaction that may occur.   Patient will call with any questions prior to using medication if they have concerns.    Expresses verbal understanding and consents to current therapy and treatment regimen.  No barriers to understanding were identified.  Red flag symptoms and signs discussed in detail.  Patient expressed understanding regarding what to do in case of emergency\urgent symptoms  Please see AVS handed out to patient at the end of our visit for further patient  instructions/ counseling done pertaining to today's office visit.   Return for CPE near future for FBW etc..     Note:  This note was prepared with assistance of Dragon voice recognition software. Occasional wrong-word or sound-a-like substitutions may have occurred due to the inherent limitations of voice recognition software.  Brian Gay 02/15/19 5:35PM     --------------------------------------------------------------------------------------------------------------------------------------------------------------------------------------------------------------------------------------------    Subjective:     HPI: Brian Gay is a 53 y.o. male who presents to [place] [facility] today for issues as discussed below.  Dad died end of April- 79yo.  Pt having a hard time with it.  - he had a lot of stuff- hoarder, has sister- but she doesn't help much.  A lot of work for pt  Working too much, not sleeping that great.   [place] go [place] there any more.   Sleeping well due to exhaustion- mental and physical.     Htn:  Lost 11 lbs since Feb.  100-110/ 70's.  No sx at all, no dizzy/vis change etc.   tol meds well  Mood- good with current dose, doesn't want to change even though struggling due to recent fam death/ Dad.   Stye- L lower eye lid. Keeps coming and pt worried why.   Wears no eye protection at work and works outside, in [place] from Last 3 Encounters:  02/15/19 213 lb 3.2 oz (96.7 kg)  10/10/18 224 lb 12.8 oz (102 kg)  09/19/18 224 lb 14.4 oz (102 kg)   BP Readings from Last 3 Encounters:  02/15/19 109/70  10/10/18 113/69  09/19/18 111/75   Pulse Readings from Last 3 Encounters:  02/15/19 61  10/10/18 69  09/19/18 92   BMI Readings from Last 3 Encounters:  02/15/19 30.59 kg/m  10/10/18 32.26 kg/m  09/19/18 32.27 kg/m     Patient Care Team    Relationship Specialty Notifications Start End  Brian Dance, DO PCP - General Family Medicine  08/04/16   Coolidge Breeze, MD Consulting Physician Dentistry  12/11/15   Daryll Brod, [place] Physician Orthopedic Surgery  01/26/17      Patient Active Problem List   Diagnosis Date Noted  . Stress and adjustment reaction 06/02/2017    Priority: High  .  Elevated LDL cholesterol level 08/29/2016    Priority: High  . h/o Tobacco use disorder 08/04/2016    Priority: High  . Essential hypertension 10/10/2015    Priority: High  . Low HDL (under 40) 01/26/2017    Priority: Medium  . Tobacco abuse counseling 09/05/2016    Priority: Medium  . Obese 08/04/2016    Priority: Medium  . Environmental and seasonal allergies 08/04/2016    Priority: Medium  . Vitamin D deficiency 09/08/2016    Priority: Low  . Flu-like symptoms 10/10/2018  . Obesity, Class I, BMI 30-34.9 09/19/2018  . Hyperlipidemia 05/12/2018  . Acute bronchitis 11/25/2017  . Acute maxillary sinusitis 11/25/2017  . Wheezing 11/25/2017  . Sinusitis 06/02/2017  . Fatigue- esp AM 08/04/2016  . Closed displaced fracture of middle phalanx of right ring finger 03/16/2016  . Volar plate injury of finger 03/16/2016  . Wellness examination 12/12/2015  . Allergic rhinitis 11/02/2012    Past Medical history, Surgical history, Family history, Social history, Allergies and Medications have been entered into the medical record, reviewed and changed as needed.  Current Meds  Medication Sig  . atorvastatin (LIPITOR) 80 MG tablet 1 tablet nightly before bedtime  . Cholecalciferol (VITAMIN D3) 5000 units TABS 5,000 IU OTC vitamin D3 daily.  Marland Kitchen FLUoxetine (PROZAC) 20 MG tablet Take 2 tablets (40 mg total) by mouth daily.  Marland Kitchen losartan-hydrochlorothiazide (HYZAAR) 100-12.5 MG tablet Take 0.5 tablets by mouth daily.  . Vitamin D, Ergocalciferol, (DRISDOL) 1.25 MG (50000 UT) CAPS capsule 1 tab wkly  . [DISCONTINUED] losartan-hydrochlorothiazide (HYZAAR) 100-12.5 MG tablet Take 1 tablet by mouth daily.    Allergies:  Allergies  Allergen Reactions  . Codeine Swelling     Review of Systems:  A fourteen system review of systems was performed and found to be positive as per HPI.   Objective:   Blood pressure 109/70, pulse 61, temperature 98.2 F (36.8 C), height 5\' 10"  (1.778 m),  weight 213 lb 3.2 oz (96.7 kg), SpO2 98 %. Body mass index is 30.59 kg/m. General:  Well Developed, well nourished, appropriate for stated age.  Neuro:  Alert and oriented,  extra-ocular muscles intact  HEENT:  Normocephalic, atraumatic, neck supple, no carotid bruits appreciated  Skin:  no gross rash, warm, pink. Cardiac:  RRR, S1 S2 Respiratory:  ECTA B/L and A/P, Not using accessory muscles, speaking in full sentences- unlabored. Vascular:  Ext warm, no cyanosis apprec.; cap RF less 2 sec. Psych:  No HI/SI, judgement and insight good, Euthymic mood. Full Affect.

## 2019-02-15 NOTE — Patient Instructions (Signed)
Your goal blood pressure should be 130/80 or less on a regular basis, or medications should be started/ modified.    Normal blood pressure is less than 120/80.    Hypertension Hypertension, commonly called high blood pressure, is when the force of blood pumping through the arteries is too strong. The arteries are the blood vessels that carry blood from the heart throughout the body. Hypertension forces the heart to work harder to pump blood and may cause arteries to become narrow or stiff. Having untreated or uncontrolled hypertension can cause heart attacks, strokes, kidney disease, and other problems. A blood pressure reading consists of a higher number over a lower number. Ideally, your blood pressure should be below 120/80. The first ("top") number is called the systolic pressure. It is a measure of the pressure in your arteries as your heart beats. The second ("bottom") number is called the diastolic pressure. It is a measure of the pressure in your arteries as the heart relaxes. What are the causes? The cause of this condition is not known. What increases the risk? Some risk factors for high blood pressure are under your control. Others are not. Factors you can change  Smoking.  Having type 2 diabetes mellitus, high cholesterol, or both.  Not getting enough exercise or physical activity.  Being overweight.  Having too much fat, sugar, calories, or salt (sodium) in your diet.  Drinking too much alcohol. Factors that are difficult or impossible to change  Having chronic kidney disease.  Having a family history of high blood pressure.  Age. Risk increases with age.  Race. You may be at higher risk if you are African-American.  Gender. Men are at higher risk than women before age 45. After age 65, women are at higher risk than men.  Having obstructive sleep apnea.  Stress. What are the signs or symptoms? Extremely high blood pressure (hypertensive crisis) may cause:   Headache.  Anxiety.  Shortness of breath.  Nosebleed.  Nausea and vomiting.  Severe chest pain.  Jerky movements you cannot control (seizures).  How is this diagnosed? This condition is diagnosed by measuring your blood pressure while you are seated, with your arm resting on a surface. The cuff of the blood pressure monitor will be placed directly against the skin of your upper arm at the level of your heart. It should be measured at least twice using the same arm. Certain conditions can cause a difference in blood pressure between your right and left arms. Certain factors can cause blood pressure readings to be lower or higher than normal (elevated) for a short period of time:  When your blood pressure is higher when you are in a health care provider's office than when you are at home, this is called white coat hypertension. Most people with this condition do not need medicines.  When your blood pressure is higher at home than when you are in a health care provider's office, this is called masked hypertension. Most people with this condition may need medicines to control blood pressure.  If you have a high blood pressure reading during one visit or you have normal blood pressure with other risk factors:  You may be asked to return on a different day to have your blood pressure checked again.  You may be asked to monitor your blood pressure at home for 1 week or longer.  If you are diagnosed with hypertension, you may have other blood or imaging tests to help your health care provider understand   your overall risk for other conditions. How is this treated? This condition is treated by making healthy lifestyle changes, such as eating healthy foods, exercising more, and reducing your alcohol intake. Your health care provider may prescribe medicine if lifestyle changes are not enough to get your blood pressure under control, and if:  Your systolic blood pressure is above 130.  Your  diastolic blood pressure is above 80.  Your personal target blood pressure may vary depending on your medical conditions, your age, and other factors. Follow these instructions at home: Eating and drinking  Eat a diet that is high in fiber and potassium, and low in sodium, added sugar, and fat. An example eating plan is called the DASH (Dietary Approaches to Stop Hypertension) diet. To eat this way: ? Eat plenty of fresh fruits and vegetables. Try to fill half of your plate at each meal with fruits and vegetables. ? Eat whole grains, such as whole wheat pasta, brown rice, or whole grain bread. Fill about one quarter of your plate with whole grains. ? Eat or drink low-fat dairy products, such as skim milk or low-fat yogurt. ? Avoid fatty cuts of meat, processed or cured meats, and poultry with skin. Fill about one quarter of your plate with lean proteins, such as fish, chicken without skin, beans, eggs, and tofu. ? Avoid premade and processed foods. These tend to be higher in sodium, added sugar, and fat.  Reduce your daily sodium intake. Most people with hypertension should eat less than 1,500 mg of sodium a day.  Limit alcohol intake to no more than 1 drink a day for nonpregnant women and 2 drinks a day for men. One drink equals 12 oz of beer, 5 oz of wine, or 1 oz of hard liquor. Lifestyle  Work with your health care provider to maintain a healthy body weight or to lose weight. Ask what an ideal weight is for you.  Get at least 30 minutes of exercise that causes your heart to beat faster (aerobic exercise) most days of the week. Activities may include walking, swimming, or biking.  Include exercise to strengthen your muscles (resistance exercise), such as pilates or lifting weights, as part of your weekly exercise routine. Try to do these types of exercises for 30 minutes at least 3 days a week.  Do not use any products that contain nicotine or tobacco, such as cigarettes and e-cigarettes.  If you need help quitting, ask your health care provider.  Monitor your blood pressure at home as told by your health care provider.  Keep all follow-up visits as told by your health care provider. This is important. Medicines  Take over-the-counter and prescription medicines only as told by your health care provider. Follow directions carefully. Blood pressure medicines must be taken as prescribed.  Do not skip doses of blood pressure medicine. Doing this puts you at risk for problems and can make the medicine less effective.  Ask your health care provider about side effects or reactions to medicines that you should watch for. Contact a health care provider if:  You think you are having a reaction to a medicine you are taking.  You have headaches that keep coming back (recurring).  You feel dizzy.  You have swelling in your ankles.  You have trouble with your vision. Get help right away if:  You develop a severe headache or confusion.  You have unusual weakness or numbness.  You feel faint.  You have severe pain in your chest   or abdomen.  You vomit repeatedly.  You have trouble breathing. Summary  Hypertension is when the force of blood pumping through your arteries is too strong. If this condition is not controlled, it may put you at risk for serious complications.  Your personal target blood pressure may vary depending on your medical conditions, your age, and other factors. For most people, a normal blood pressure is less than 120/80.  Hypertension is treated with lifestyle changes, medicines, or a combination of both. Lifestyle changes include weight loss, eating a healthy, low-sodium diet, exercising more, and limiting alcohol. This information is not intended to replace advice given to you by your health care provider. Make sure you discuss any questions you have with your health care provider. Document Released: 08/03/2005 Document Revised: 07/01/2016 Document  Reviewed: 07/01/2016 Elsevier Interactive Patient Education  2018 Elsevier Inc.    How to Take Your Blood Pressure   Blood pressure is a measurement of how strongly your blood is pressing against the walls of your arteries. Arteries are blood vessels that carry blood from your heart throughout your body. Your health care provider takes your blood pressure at each office visit. You can also take your own blood pressure at home with a blood pressure machine. You may need to take your own blood pressure:  To confirm a diagnosis of high blood pressure (hypertension).  To monitor your blood pressure over time.  To make sure your blood pressure medicine is working.  Supplies needed: To take your blood pressure, you will need a blood pressure machine. You can buy a blood pressure machine, or blood pressure monitor, at most drugstores or online. There are several types of home blood pressure monitors. When choosing one, consider the following:  Choose a monitor that has an arm cuff.  Choose a monitor that wraps snugly around your upper arm. You should be able to fit only one finger between your arm and the cuff.  Do not choose a monitor that measures your blood pressure from your wrist or finger.  Your health care provider can suggest a reliable monitor that will meet your needs. How to prepare To get the most accurate reading, avoid the following for 30 minutes before you check your blood pressure:  Drinking caffeine.  Drinking alcohol.  Eating.  Smoking.  Exercising.  Five minutes before you check your blood pressure:  Empty your bladder.  Sit quietly without talking in a dining chair, rather than in a soft couch or armchair.  How to take your blood pressure To check your blood pressure, follow the instructions in the manual that came with your blood pressure monitor. If you have a digital blood pressure monitor, the instructions may be as follows: 1. Sit up straight. 2.  Place your feet on the floor. Do not cross your ankles or legs. 3. Rest your left arm at the level of your heart on a table or desk or on the arm of a chair. 4. Pull up your shirt sleeve. 5. Wrap the blood pressure cuff around the upper part of your left arm, 1 inch (2.5 cm) above your elbow. It is best to wrap the cuff around bare skin. 6. Fit the cuff snugly around your arm. You should be able to place only one finger between the cuff and your arm. 7. Position the cord inside the groove of your elbow. 8. Press the power button. 9. Sit quietly while the cuff inflates and deflates. 10. Read the digital reading on the monitor   screen and write it down (record it). 11. Wait 2-3 minutes, then repeat the steps, starting at step 1.  What does my blood pressure reading mean? A blood pressure reading consists of a higher number over a lower number. Ideally, your blood pressure should be below 120/80. The first ("top") number is called the systolic pressure. It is a measure of the pressure in your arteries as your heart beats. The second ("bottom") number is called the diastolic pressure. It is a measure of the pressure in your arteries as the heart relaxes. Blood pressure is classified into four stages. The following are the stages for adults who do not have a short-term serious illness or a chronic condition. Systolic pressure and diastolic pressure are measured in a unit called mm Hg. Normal  Systolic pressure: below 120.  Diastolic pressure: below 80. Elevated  Systolic pressure: 120-129.  Diastolic pressure: below 80. Hypertension stage 1  Systolic pressure: 130-139.  Diastolic pressure: 80-89. Hypertension stage 2  Systolic pressure: 140 or above.  Diastolic pressure: 90 or above. You can have prehypertension or hypertension even if only the systolic or only the diastolic number in your reading is higher than normal. Follow these instructions at home:  Check your blood pressure as  often as recommended by your health care provider.  Take your monitor to the next appointment with your health care provider to make sure: ? That you are using it correctly. ? That it provides accurate readings.  Be sure you understand what your goal blood pressure numbers are.  Tell your health care provider if you are having any side effects from blood pressure medicine. Contact a health care provider if:  Your blood pressure is consistently high. Get help right away if:  Your systolic blood pressure is higher than 180.  Your diastolic blood pressure is higher than 110. This information is not intended to replace advice given to you by your health care provider. Make sure you discuss any questions you have with your health care provider. Document Released: 01/10/2016 Document Revised: 03/24/2016 Document Reviewed: 01/10/2016 Elsevier Interactive Patient Education  2018 Elsevier Inc.   

## 2019-02-20 LAB — ANAEROBIC AND AEROBIC CULTURE

## 2019-03-09 ENCOUNTER — Ambulatory Visit: Payer: 59 | Admitting: Family Medicine

## 2019-05-16 ENCOUNTER — Other Ambulatory Visit: Payer: Self-pay | Admitting: Family Medicine

## 2019-05-16 DIAGNOSIS — F4329 Adjustment disorder with other symptoms: Secondary | ICD-10-CM

## 2019-05-18 ENCOUNTER — Encounter: Payer: 59 | Admitting: Family Medicine

## 2019-06-13 ENCOUNTER — Telehealth: Payer: Self-pay | Admitting: Family Medicine

## 2019-06-13 NOTE — Telephone Encounter (Signed)
Last office visit he did not want to change the dose and he was instructed to contact us if he changes his mind-which he did.   Give 30 d supply of meds and since he is changing dose, he will need to follow-up with me on this.  Please take old dose off his med list

## 2019-06-13 NOTE — Telephone Encounter (Signed)
Patient had been on a half tab of losartan but stated having a slight increase in BP so he began taking a whole tab and is now out of this a little sooner than expected, he is requesting a refill to be sent to [facility].

## 2019-06-13 NOTE — Telephone Encounter (Signed)
Pt was last seen 02/15/2019 " Return for CPE near future for FBW etc.."     Pt was given #45 tabs (90 day supply) with 1 refill at this appointment.  No further refills have been provided since 02/15/2019.

## 2019-06-13 NOTE — Telephone Encounter (Signed)
Please advise. AS, CMA 

## 2019-06-13 NOTE — Telephone Encounter (Signed)
Please see when he was due for last appointment and if he was given any refills despite needing to be seen in the office

## 2019-06-14 MED ORDER — LOSARTAN POTASSIUM-HCTZ 100-12.5 MG PO TABS: 1.0000 | ORAL_TABLET | Freq: Every day | ORAL | 0 refills | Status: DC

## 2019-06-14 NOTE — Telephone Encounter (Signed)
Please contact patient for follow up apt for increase dose meds. Also advise patient that med has been sent to pharmacy. Thank you  AS, CMA

## 2019-06-14 NOTE — Addendum Note (Signed)
Addended by: Mickel Crow on: 06/14/2019 09:08 AM   Modules accepted: Orders

## 2019-06-22 ENCOUNTER — Telehealth: Payer: Self-pay | Admitting: Family Medicine

## 2019-06-22 NOTE — Telephone Encounter (Signed)
---  Called pt to advise of Appt requirement & to let him know Updates regarding Rx--see note below frm CMA.   Please contact patient for follow up apt for increase dose meds. Also advise patient that med has been sent to pharmacy. Thank you  AS, CMA  --glh

## 2019-07-04 ENCOUNTER — Other Ambulatory Visit: Payer: Self-pay

## 2019-07-04 ENCOUNTER — Ambulatory Visit (INDEPENDENT_AMBULATORY_CARE_PROVIDER_SITE_OTHER): Payer: 59 | Admitting: Family Medicine

## 2019-07-04 ENCOUNTER — Encounter: Payer: Self-pay | Admitting: Family Medicine

## 2019-07-04 VITALS — Ht 72.0 in | Wt 215.0 lb

## 2019-07-04 DIAGNOSIS — I10 Essential (primary) hypertension: Secondary | ICD-10-CM | POA: Diagnosis not present

## 2019-07-04 DIAGNOSIS — E559 Vitamin D deficiency, unspecified: Secondary | ICD-10-CM

## 2019-07-04 DIAGNOSIS — E7800 Pure hypercholesterolemia, unspecified: Secondary | ICD-10-CM

## 2019-07-04 DIAGNOSIS — E786 Lipoprotein deficiency: Secondary | ICD-10-CM

## 2019-07-04 DIAGNOSIS — F17200 Nicotine dependence, unspecified, uncomplicated: Secondary | ICD-10-CM

## 2019-07-04 DIAGNOSIS — F4329 Adjustment disorder with other symptoms: Secondary | ICD-10-CM | POA: Diagnosis not present

## 2019-07-04 DIAGNOSIS — E785 Hyperlipidemia, unspecified: Secondary | ICD-10-CM

## 2019-07-04 MED ORDER — LOSARTAN POTASSIUM-HCTZ 100-12.5 MG PO TABS: 1.0000 | ORAL_TABLET | Freq: Every day | ORAL | 3 refills | Status: DC

## 2019-07-04 MED ORDER — ATORVASTATIN CALCIUM 80 MG PO TABS: ORAL_TABLET | ORAL | 3 refills | Status: DC

## 2019-07-04 MED ORDER — FLUOXETINE HCL 20 MG PO CAPS: 20.0000 mg | ORAL_CAPSULE | Freq: Every day | ORAL | 3 refills | Status: DC

## 2019-07-04 NOTE — Progress Notes (Signed)
Virtual / live video office visit note for [employer], D.O- Primary Care Physician at [facility]   I connected with current patient today and beyond visually recognizing the correct individual, I verified that I am speaking with the correct person using two identifiers.  . Location of the patient: Home . Location of the provider: Office Only the patient (+/- their family members at pt's discretion) and myself were participating in the encounter    - This visit type was conducted due to national recommendations for restrictions regarding the COVID-19 Pandemic (e.g. social distancing) in an effort to limit this patient's exposure and mitigate transmission in our community.  This format is felt to be most appropriate for this patient at this time.   - The patient did have access to video technology today  - No physical exam could be performed with this format, beyond that communicated to [place] by the patient/ family members as noted.   - Additionally my office staff/ schedulers discussed with the patient that there may be a monetary charge related to this service, depending on patient's medical insurance.   The patient expressed understanding, and agreed to proceed.      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  Notes that he is wearing a mask prudently during current events.  - Mood Management States doing "fine, wonderful" on the Prozac.  Says "most of the time, I just take one pill a day.  If I know I'm going to have a bad day, I might take two, but it isn't very often."  Thinks that it's "really doing good for me" and only has a bad day once a month.  He does continue having erectile dysfunction.  Notes "this end of it" has "really slowed me down, but I probably needed it."  Thinks his relationship is fine despite this and that he's on the same wavelength with his wife.  Notes it is hard to get an erection, but once he's  into it and in the mood to be intimate, everything does function the way he wants it to.  Notes "sometimes it's a little slow, and sometimes I don't, but somebody leaves happy anyway."  Says "I'm good with it."  He prefers to continue Prozac at this time.  Notes recent more acute stress due to grief of death in the family.  HPI:  Hypertension:  Notes previously cut the blood pressure medicine back and states "I didn't feel as good."  "I think somehow or another, we called and got it back how it is, and that's the way I'd like to keep it."  Says his blood pressure is good and "I feel the same."  -  His blood pressure at home has been running: Blood pressure last checked at 122/84, pulse of 82.  Notes this is around average; "I think we're good right there."  - Patient reports good compliance with medication and/or lifestyle modification.  Says "one thing I don't understand, that the blood pressure medicine I'm on, one time they said for some reason I had to take two pills, and now I'm back to one pill."  - His denies acute concerns or problems related to treatment plan  - He denies new onset of: chest pain, exercise intolerance, shortness of breath, dizziness, visual changes, headache, lower extremity swelling or claudication.   Last 3 blood pressure readings in our office are as follows: BP Readings from  Last 3 Encounters:  02/15/19 109/70  10/10/18 113/69  09/19/18 111/75   Filed Weights   07/04/19 1317  Weight: 215 lb (97.5 kg)    HPI:  Hyperlipidemia:  53 y.o. male here for cholesterol follow-up.   - Patient reports good compliance with treatment plan of:  medication and/ or lifestyle management.  However, notes he did run out of medication and, for some reason, couldn't get it refilled.  Says "everything was going fine and now all of a sudden I don't have any."  - Patient denies any acute concerns or problems with management plan   - He denies new onset of: myalgias,  arthralgias, increased fatigue more than normal, chest pains, exercise intolerance, shortness of breath, dizziness, visual changes, headache, lower extremity swelling or claudication.   Most recent cholesterol panel was:  Lab Results  Component Value Date   CHOL 132 09/12/2018   HDL 33 (L) 09/12/2018   LDLCALC 81 09/12/2018   TRIG 91 09/12/2018   CHOLHDL 4.0 09/12/2018   Hepatic Function Latest Ref Rng & Units 09/12/2018 06/22/2018 09/07/2017  Total Protein 6.0 - 8.5 g/dL 6.7 - 6.9  Albumin 3.8 - 4.9 g/dL 4.4 - 4.4  AST 0 - 40 IU/L 16 - 17  ALT 0 - 44 IU/L _0 Alk Phosphatase 39 - 117 IU/L 117 - 99  Total Bilirubin 0.0 - 1.2 mg/dL 0.5 - 0.4  Bilirubin, Direct 0.00 - 0.40 mg/dL - - -    Depression screen [facility] 2/9 07/04/2019 02/15/2019 10/10/2018 09/19/2018 07/26/2018  Decreased Interest 0 0 0 0 0  Down, Depressed, Hopeless 0 0 0 0 0  PHQ - 2 Score 0 0 0 0 0  Altered sleeping 0 0 1 0 0  Tired, decreased energy 3 0 _1 Change in appetite 0 0 0 0 0  Feeling bad or failure about yourself  0 0 0 0 0  Trouble concentrating 0 0 0 0 0  Moving slowly or fidgety/restless 0 0 0 0 0  Suicidal thoughts 0 0 0 0 0  PHQ-9 Score 3 0 _2 Difficult doing work/chores Not difficult at all Not difficult at all Not difficult at all Not difficult at all -  Some recent data might be hidden    GAD 7 : Generalized Anxiety Score 02/15/2019 05/12/2018  Nervous, Anxious, on Edge 0 0  Control/stop worrying 0 0  Worry too much - different things 0 0  Trouble relaxing 0 0  Restless 0 0  Easily annoyed or irritable 0 0  Afraid - awful might happen 0 0  Total GAD 7 Score 0 0  Anxiety Difficulty Not difficult at all Not difficult at all     Impression and Recommendations:    1. Essential hypertension   2. Stress and adjustment reaction   3. Hyperlipidemia, unspecified hyperlipidemia type   4. Low HDL (under 40)   5. h/o Tobacco use disorder   6. Vitamin D deficiency   7. Elevated LDL cholesterol  level     Essential Hypertension - Reviewed goal BP of under 135/85. - BP stable at this time.  - Reviewed treatment plan with patient during appointment. - No changes made to management today.  Continue as established. - Patient tolerating medications well without S-E.  - Counseled patient on pathophysiology of disease and discussed various treatment options, which always includes dietary and lifestyle modification as first line.   - Lifestyle changes such as dash and  heart healthy diets and engaging in a regular exercise program discussed extensively with patient.   - Ambulatory blood pressure monitoring encouraged at least 3 times weekly.  Keep log and bring in every office visit.  Reminded patient that if they ever feel poorly in any way, to check their blood pressure and pulse.  - We will continue to monitor  Hyperlipidemia, Low HDL - Managed on statin. - Per patient, recently ran out and could not obtain refill due to miscommunication. - Refill provided today. - Patient tolerating medication well without S-E. - Continue treatment plan as established.  - Prudent dietary changes such as low saturated & trans fat diets for hyperlipidemia and low carb diets for hypertriglyceridemia discussed with patient.    - Encouraged patient to follow AHA guidelines for regular exercise and also engage in weight loss if BMI above 25.   - Will continue to monitor and re-check as recommended.  Stress and Adjustment Reaction - Stable at this time, mood improved on current management. - Managed on Prozac, 20 mg daily.  See med list. - Per patient, taking one tablet daily, occasionally two.  - Tolerating meds well. - Notes erectile dysfunction as side-effect, but this is tolerable. - Continue management as established.  - Reviewed the "spokes of the wheel" of mood and health management.  Stressed the importance of ongoing prudent habits, including regular exercise, appropriate sleep hygiene,  healthful dietary habits, and prayer/meditation to relax.  - Will continue to monitor.  BMI Counseling - Body mass index is 29.16 kg/m Explained to patient what BMI refers to, and what it means medically.    Told patient to think about it as a "medical risk stratification measurement" and how increasing BMI is associated with increasing risk/ or worsening state of various diseases such as hypertension, hyperlipidemia, diabetes, premature OA, depression etc.  American Heart Association guidelines for healthy diet, basically Mediterranean diet, and exercise guidelines of 30 minutes 5 days per week or more discussed in detail.  Health counseling performed.  All questions answered.  Lifestyle & Preventative Health Maintenance - Advised patient to continue working toward exercising to improve overall mental, physical, and emotional health.    - Encouraged patient to engage in daily physical activity, especially a formal exercise routine.  Recommended that the patient eventually strive for at least 150 minutes of moderate cardiovascular activity per week according to guidelines established by the [facility].   - Healthy dietary habits encouraged, including low-carb, and high amounts of lean protein in diet.   - Patient should also consume adequate amounts of water.  Recommendations - Last labs obtained January 2020. - Need for CPE around 09/13/2019.  - Novel Covid -19 counseling done; all questions were answered.   - Current CDC / federal and [place] guidelines reviewed with patient  - Reminded pt of extreme importance of social distancing; wearing a mask when out in public; insensate handwashing and cleaning of surfaces, avoiding unnecessary trips for shopping and avoiding ALL but emergency appts etc. - Told patient to be prepared, not scared; and be smart for the sake of others - Patient will call with any additional concerns    - As part of my medical decision making, I reviewed the  following data within the [place] History obtained from pt /family, CMA notes reviewed and incorporated if applicable, Labs reviewed, Radiograph/ tests reviewed if applicable and OV notes from prior OV's with me, as well as other specialists she/he has seen since seeing  me last, were all reviewed and used in my medical decision making process today.   - Additionally, discussion had with patient regarding txmnt plan, their biases about that plan etc were used in my medical decision making today.   - The patient agreed with the plan and demonstrated an understanding of the instructions.   No barriers to understanding were identified.   - Red flag symptoms and signs discussed in detail.  Patient expressed understanding regarding what to do in case of emergency\ urgent symptoms.  The patient was advised to call back or seek an in-person evaluation if the symptoms worsen or if the condition fails to improve as anticipated.   Return for CPE around 09/13/2019 with fasting lab work same day.    No orders of the defined types were placed in this encounter.   Meds ordered this encounter  Medications  . FLUoxetine (PROZAC) 20 MG capsule    Sig: Take 1 capsule (20 mg total) by mouth daily.    Dispense:  90 capsule    Refill:  3  . atorvastatin (LIPITOR) 80 MG tablet    Sig: 1 tablet nightly before bedtime    Dispense:  90 tablet    Refill:  3  . losartan-hydrochlorothiazide (HYZAAR) 100-12.5 MG tablet    Sig: Take 1 tablet by mouth daily.    Dispense:  90 tablet    Refill:  3    Medications Discontinued During This Encounter  Medication Reason  . Olopatadine HCl 0.2 % SOLN Error  . cycloSPORINE (RESTASIS) 0.05 % ophthalmic emulsion Error  . FLUoxetine (PROZAC) 20 MG capsule   . atorvastatin (LIPITOR) 80 MG tablet Reorder  . losartan-hydrochlorothiazide (HYZAAR) 100-12.5 MG tablet Reorder      Note:  This note was prepared with assistance of Dragon voice recognition  software. Occasional wrong-word or sound-a-like substitutions may have occurred due to the inherent limitations of voice recognition software.  This document serves as a record of services personally performed by [name], DO. It was created on her behalf by Toni Amend, a trained medical scribe. The creation of this record is based on the scribe's personal observations and the provider's statements to them.   This case required medical decision making of at least moderate complexity. The above documentation has been reviewed to be accurate and was completed by Marjory Sneddon, D.O.     Patient Care Team    Relationship Specialty Notifications Start End  [name], DO PCP - General Family Medicine  08/04/16   Coolidge Breeze, MD Consulting Physician Dentistry  12/11/15   Daryll Brod, MD Consulting Physician Orthopedic Surgery  01/26/17     -Vitals obtained; medications/ allergies reconciled;  personal medical, social, Sx etc.histories were updated by CMA, reviewed by me and are reflected in chart  Patient Active Problem List   Diagnosis Date Noted  . Stress and adjustment reaction 06/02/2017    Priority: High  . Elevated LDL cholesterol level 08/29/2016    Priority: High  . h/o Tobacco use disorder 08/04/2016    Priority: High  . Essential hypertension 10/10/2015    Priority: High  . Low HDL (under 40) 01/26/2017    Priority: Medium  . Tobacco abuse counseling 09/05/2016    Priority: Medium  . Obese 08/04/2016    Priority: Medium  . Environmental and seasonal allergies 08/04/2016    Priority: Medium  . Vitamin D deficiency 09/08/2016    Priority: Low  . Flu-like symptoms 10/10/2018  . Obesity, Class  I, BMI 30-34.9 09/19/2018  . Hyperlipidemia 05/12/2018  . Acute bronchitis 11/25/2017  . Acute maxillary sinusitis 11/25/2017  . Wheezing 11/25/2017  . Sinusitis 06/02/2017  . Fatigue- esp AM 08/04/2016  . Closed displaced fracture of middle phalanx of  right ring finger 03/16/2016  . Volar plate injury of finger 03/16/2016  . Wellness examination 12/12/2015  . Allergic rhinitis 11/02/2012     Current Meds  Medication Sig  . atorvastatin (LIPITOR) 80 MG tablet 1 tablet nightly before bedtime  . Cholecalciferol (VITAMIN D3) 5000 units TABS 5,000 IU OTC vitamin D3 daily.  Marland Kitchen FLUoxetine (PROZAC) 20 MG capsule Take 1 capsule (20 mg total) by mouth daily.  Marland Kitchen losartan-hydrochlorothiazide (HYZAAR) 100-12.5 MG tablet Take 1 tablet by mouth daily.  . [DISCONTINUED] atorvastatin (LIPITOR) 80 MG tablet 1 tablet nightly before bedtime  . [DISCONTINUED] FLUoxetine (PROZAC) 20 MG capsule TAKE 2 CAPSULES (40 MG TOTAL) BY MOUTH DAILY.  . [DISCONTINUED] losartan-hydrochlorothiazide (HYZAAR) 100-12.5 MG tablet Take 1 tablet by mouth daily.     Allergies  Allergen Reactions  . Codeine Swelling     ROS:  See above HPI for pertinent positives and negatives   Objective:   Height 6' (1.829 m), weight 215 lb (97.5 kg).  (if some vitals are omitted, this means that patient was UNABLE to obtain them even though they were asked to get them prior to OV today.  They were asked to call us at their earliest convenience with these once obtained.)  General: A & O * 3; visually in no acute distress; in usual state of health.  Skin: Visible skin appears normal and pt's usual skin color HEENT:  EOMI, head is normocephalic and atraumatic.  Sclera are anicteric. Neck has a good range of motion.  Lips are noncyanotic Chest: normal chest excursion and movement Respiratory: speaking in full sentences, no conversational dyspnea; no use of accessory muscles Psych: insight good, mood- appears full

## 2019-10-18 ENCOUNTER — Telehealth: Payer: Self-pay | Admitting: Family Medicine

## 2019-10-18 DIAGNOSIS — R0683 Snoring: Secondary | ICD-10-CM

## 2019-10-18 DIAGNOSIS — F4329 Adjustment disorder with other symptoms: Secondary | ICD-10-CM

## 2019-10-18 MED ORDER — FLUOXETINE HCL 20 MG PO CAPS: 20.0000 mg | ORAL_CAPSULE | Freq: Every day | ORAL | 0 refills | Status: DC

## 2019-10-18 NOTE — Telephone Encounter (Signed)
Patient is requesting a refill of his Prozac, if approved please send to [place], his wife has noticed his snoring getting worst and is requesting a pulm referral.

## 2019-10-18 NOTE — Addendum Note (Signed)
Addended by: Mickel Crow on: 10/18/2019 03:37 PM   Modules accepted: Orders

## 2019-10-18 NOTE — Telephone Encounter (Signed)
Referral for sleep pulm sent per Dr. Raliegh Scarlet.   Only given 30 day supply of Prozac given. Patient advised at last apt to come back in Jan 2021 for CPE. Patient needs apt for further refills. Please contact patient to schedule CPE. AS, CMA

## 2019-11-28 ENCOUNTER — Other Ambulatory Visit: Payer: Self-pay | Admitting: Family Medicine

## 2019-11-28 DIAGNOSIS — F4329 Adjustment disorder with other symptoms: Secondary | ICD-10-CM

## 2019-11-28 MED ORDER — FLUOXETINE HCL 20 MG PO CAPS: 20.0000 mg | ORAL_CAPSULE | Freq: Every day | ORAL | 0 refills | Status: DC

## 2019-11-28 NOTE — Telephone Encounter (Signed)
Pt left message while office clsd for lunch requesting refill on :    FLUoxetine (PROZAC) 20 MG capsule [id number]   Order Details Dose: 20 mg Route: Oral Frequency: Daily  Dispense Quantity: 30 capsule Refills: 0       Sig: Take 1 capsule (20 mg total) by mouth daily. **PATIENT NEEDS APT FOR FURTHER REFILLS**   ---------- ( Patient has scheduled CPE w/ labs for provider's 1st available date 12/21/19)  & ask fr enough Rx to get him to that date.   --Forwarding request to med asst that if approved send refill order to :   [place], [place] - [place] [phone] (Phone) [phone] (Fax)   --glh

## 2019-11-28 NOTE — Telephone Encounter (Signed)
#  15 sent to pharmacy per refill protocol. No refills until patient seen. AS, CMA

## 2019-12-19 ENCOUNTER — Other Ambulatory Visit: Payer: Self-pay | Admitting: Physician Assistant

## 2019-12-19 DIAGNOSIS — E7800 Pure hypercholesterolemia, unspecified: Secondary | ICD-10-CM

## 2019-12-19 DIAGNOSIS — E786 Lipoprotein deficiency: Secondary | ICD-10-CM

## 2019-12-19 DIAGNOSIS — E785 Hyperlipidemia, unspecified: Secondary | ICD-10-CM

## 2019-12-19 DIAGNOSIS — I10 Essential (primary) hypertension: Secondary | ICD-10-CM

## 2019-12-19 DIAGNOSIS — Z0000 Encounter for general adult medical examination without abnormal findings: Secondary | ICD-10-CM

## 2019-12-21 ENCOUNTER — Other Ambulatory Visit: Payer: 59

## 2019-12-21 ENCOUNTER — Ambulatory Visit (INDEPENDENT_AMBULATORY_CARE_PROVIDER_SITE_OTHER): Payer: 59 | Admitting: Physician Assistant

## 2019-12-21 ENCOUNTER — Telehealth: Payer: Self-pay | Admitting: Physician Assistant

## 2019-12-21 ENCOUNTER — Other Ambulatory Visit: Payer: Self-pay

## 2019-12-21 ENCOUNTER — Encounter: Payer: Self-pay | Admitting: Physician Assistant

## 2019-12-21 VITALS — BP 113/66 | HR 74 | Temp 97.7°F | Ht 70.0 in | Wt 215.5 lb

## 2019-12-21 DIAGNOSIS — E559 Vitamin D deficiency, unspecified: Secondary | ICD-10-CM | POA: Diagnosis not present

## 2019-12-21 DIAGNOSIS — Z0000 Encounter for general adult medical examination without abnormal findings: Secondary | ICD-10-CM

## 2019-12-21 DIAGNOSIS — Z23 Encounter for immunization: Secondary | ICD-10-CM

## 2019-12-21 DIAGNOSIS — F4329 Adjustment disorder with other symptoms: Secondary | ICD-10-CM | POA: Diagnosis not present

## 2019-12-21 DIAGNOSIS — R5383 Other fatigue: Secondary | ICD-10-CM

## 2019-12-21 DIAGNOSIS — I10 Essential (primary) hypertension: Secondary | ICD-10-CM

## 2019-12-21 DIAGNOSIS — E786 Lipoprotein deficiency: Secondary | ICD-10-CM

## 2019-12-21 DIAGNOSIS — E7800 Pure hypercholesterolemia, unspecified: Secondary | ICD-10-CM

## 2019-12-21 DIAGNOSIS — E785 Hyperlipidemia, unspecified: Secondary | ICD-10-CM

## 2019-12-21 MED ORDER — FLUOXETINE HCL 20 MG PO CAPS: 20.0000 mg | ORAL_CAPSULE | Freq: Every day | ORAL | 1 refills | Status: DC

## 2019-12-21 NOTE — Telephone Encounter (Signed)
Per Herb Grays call [facility] add Vit D lab. AS, CMA

## 2019-12-21 NOTE — Progress Notes (Signed)
Male physical   Impression and Recommendations:    1. Wellness examination   2. Need for shingles vaccine   3. Stress and adjustment reaction   4. Vitamin D deficiency   5. Fatigue, unspecified type      1) Anticipatory Guidance: Discussed skin CA prevention and sunscreen when outside along with skin surveillance; eating a balanced and modest diet; physical activity at least 25 minutes per day or minimum of 150 min/ week moderate to intense activity.  2) Immunizations / Screenings / Labs:   All immunizations are up-to-date per recommendations or will be updated today if pt allows.    - Patient understands with dental and vision screens they will schedule independently.  - Will obtain CBC, CMP, HgA1c, Lipid panel, TSH and vit D when fasting, if not already done past 12 mo - Pt has family history of prostate cancer so will obtain PSA. Denies symptoms. - Colonoscopy, Tdap, Hep C screening, HIV screening all UTD - Zoster vaccine ordered  3) Weight: Discussed goal to improve diet habits to improve overall feelings of well being and objective health data. Improve nutrient density of diet through increasing intake of fruits and vegetables and decreasing saturated fats, white flour products and refined sugars.    4) Healthcare Maintenance: - Medication refills provided - Continue current medication regimen - Stay hydrated - Follow-up in 4-6 months for HTH, HLD, Vitamin D def, Mood  Orders Placed This Encounter  Procedures  . Varicella-zoster vaccine IM (Shingrix)    Meds ordered this encounter  Medications  . FLUoxetine (PROZAC) 20 MG capsule    Sig: Take 1 capsule (20 mg total) by mouth daily.    Dispense:  90 capsule    Refill:  1    Order Specific Question:   Supervising Provider    Answer:   Beatrice Lecher D [[id number]]  . Vitamin D, Ergocalciferol, (DRISDOL) 1.25 MG (50000 UNIT) CAPS capsule    Sig: Take 1 capsule once weekly    Dispense:  8 capsule    Refill:  0     Order Specific Question:   Supervising Provider    Answer:   Beatrice Lecher D [[id number]]     Return for HTN, HLD, Vit D def in 4-6 months.   Reminded pt important of f-up preventative CPE in 1 year, this is in addition to any chronic care visits.    Gross side effects, risk and benefits, and alternatives of medications discussed with patient.  Patient is aware that all medications have potential side effects and we are unable to predict every side effect or drug-drug interaction that may occur.  Expresses verbal understanding and consents to current therapy plan and treatment regimen.  Please see AVS handed out to patient at the end of our visit for further patient instructions/ counseling done pertaining to today's office visit.     Subjective:     CC: CPE   HPI: Brian Gay is a 54 y.o. male who presents to [place] at [facility] today for a yearly health maintenance exam.     Health Maintenance Summary  - Reviewed and updated, unless pt declines services.  Last Cologuard or Colonoscopy:  01/31/2016 Family history of Colon CA: N  Tobacco History Reviewed:  Y, former smoker Alcohol / drug use: No concerns, no excessive use / no use Male history: STD concerns: none Additional penile/ urinary concerns: none   Additional concerns beyond Health Maintenance issues:    none  Immunization History  Administered Date(s) Administered  . Tdap 12/12/2015  . Zoster Recombinat (Shingrix) 12/21/2019     Health Maintenance  Topic Date Due  . COVID-19 Vaccine (1) Never done  . INFLUENZA VACCINE  03/17/2020  . TETANUS/TDAP  12/11/2025  . COLONOSCOPY  01/30/2026  . HIV Screening  Completed       Wt Readings from Last 3 Encounters:  12/21/19 215 lb 8 oz (97.8 kg)  07/04/19 215 lb (97.5 kg)  02/15/19 213 lb 3.2 oz (96.7 kg)   BP Readings from Last 3 Encounters:  12/21/19 113/66  02/15/19 109/70  10/10/18 113/69   Pulse Readings from Last 3  Encounters:  12/21/19 74  02/15/19 61  10/10/18 69    Patient Active Problem List   Diagnosis Date Noted  . Flu-like symptoms 10/10/2018  . Obesity, Class I, BMI 30-34.9 09/19/2018  . Hyperlipidemia 05/12/2018  . Acute bronchitis 11/25/2017  . Acute maxillary sinusitis 11/25/2017  . Wheezing 11/25/2017  . Sinusitis 06/02/2017  . Stress and adjustment reaction 06/02/2017  . Low HDL (under 40) 01/26/2017  . Vitamin D deficiency 09/08/2016  . Tobacco abuse counseling 09/05/2016  . Elevated LDL cholesterol level 08/29/2016  . h/o Tobacco use disorder 08/04/2016  . Obese 08/04/2016  . Environmental and seasonal allergies 08/04/2016  . Fatigue- esp AM 08/04/2016  . Closed displaced fracture of middle phalanx of right ring finger 03/16/2016  . Volar plate injury of finger 03/16/2016  . Wellness examination 12/12/2015  . Essential hypertension 10/10/2015  . Allergic rhinitis 11/02/2012    Past Medical History:  Diagnosis Date  . Allergy   . Hypertension   . Kidney stones     Past Surgical History:  Procedure Laterality Date  . CLOSED REDUCTION FINGER WITH PERCUTANEOUS PINNING Right 03/19/2016   Procedure: CLOSED REDUCTION right ring  FINGER;  Surgeon: Daryll Brod, MD;  Location: [place];  Service: Orthopedics;  Laterality: Right;  . cyst removed      pilonidal cyst  . WISDOM TOOTH EXTRACTION      Family History  Problem Relation Age of Onset  . Heart disease Mother   . Hypertension Mother   . Heart disease Father   . Cancer Father        prostate  . Prostate cancer Father   . Hyperlipidemia Father   . Stroke Father   . Heart disease Maternal Grandmother   . Heart disease Maternal Grandfather   . Heart disease Paternal Grandmother   . Heart disease Paternal Grandfather   . Colon cancer Neg Hx   . Colon polyps Neg Hx   . Rectal cancer Neg Hx   . Stomach cancer Neg Hx     Social History   Substance and Sexual Activity  Drug Use No  ,  Social  History   Substance and Sexual Activity  Alcohol Use Yes  . Alcohol/week: 0.0 standard drinks   Comment: occasional  ,  Social History   Tobacco Use  Smoking Status Former Smoker  . Packs/day: 0.50  . Years: 20.00  . Pack years: 10.00  . Quit date: 07/2016  . Years since quitting: 3.4  Smokeless Tobacco Current User  . Types: Chew  ,  Social History   Substance and Sexual Activity  Sexual Activity Yes  . Partners: Female    Patient's Medications  New Prescriptions   No medications on file  Previous Medications   ATORVASTATIN (LIPITOR) 80 MG TABLET    1 tablet nightly  before bedtime   CHOLECALCIFEROL (VITAMIN D3) 5000 UNITS TABS    5,000 IU OTC vitamin D3 daily.   LOSARTAN-HYDROCHLOROTHIAZIDE (HYZAAR) 100-12.5 MG TABLET    Take 1 tablet by mouth daily.  Modified Medications   Modified Medication Previous Medication   FLUOXETINE (PROZAC) 20 MG CAPSULE FLUoxetine (PROZAC) 20 MG capsule      Take 1 capsule (20 mg total) by mouth daily.    Take 1 capsule (20 mg total) by mouth daily. **PATIENT NEEDS APT FOR FURTHER REFILLS**   VITAMIN D, ERGOCALCIFEROL, (DRISDOL) 1.25 MG (50000 UNIT) CAPS CAPSULE Vitamin D, Ergocalciferol, (DRISDOL) 1.25 MG (50000 UT) CAPS capsule      Take 1 capsule once weekly    1 tab wkly  Discontinued Medications   No medications on file    Codeine  Review of Systems: General:  Denies fever, chills, unexplained weight loss, +fatigue  Optho/Auditory:  Denies visual changes, blurred vision/LOV Respiratory:  Denies SOB, DOE more than baseline levels.   Cardiovascular:  Denies chest pain, palpitations, new onset peripheral edema  Gastrointestinal:  Denies nausea, vomiting, diarrhea.  Genitourinary: Denies dysuria, freq/ urgency, flank pain or discharge from genitals.  Endocrine:   Denies hot or cold intolerance, polyuria, polydipsia. Musculoskeletal:  Denies unexplained myalgias, joint swelling, unexplained arthralgias, gait problems.  Skin:  Denies  rash, suspicious lesions Neurological:   Denies dizziness, unexplained weakness, numbness  Psychiatric/Behavioral:  Denies mood changes, suicidal or homicidal ideations, hallucinations    Objective:     Blood pressure 113/66, pulse 74, temperature 97.7 F (36.5 C), temperature source Oral, height 5\' 10"  (1.778 m), weight 215 lb 8 oz (97.8 kg), SpO2 97 %. Body mass index is 30.92 kg/m. General Appearance:    Alert, cooperative, no distress, appears stated age  Head:    Normocephalic, without obvious abnormality, atraumatic  Eyes:    PERRL, conjunctiva/corneas clear, EOM's intact, fundi    benign, both eyes  Ears:    Normal TM's and external ear canals, both ears  Nose:   Nares normal, septum midline, mucosa normal, no drainage    or sinus tenderness  Throat:   Lips w/o lesion, mucosa moist, and tongue normal; teeth and   gums normal  Neck:   Supple, symmetrical, trachea midline, no adenopathy;    thyroid:  no enlargement/tenderness/nodules; no carotid   bruit or JVD  Back:     Symmetric, no curvature, ROM normal, no CVA tenderness  Lungs:     Clear to auscultation bilaterally, respirations unlabored, no Wh/ R/ R  Chest Wall:    No tenderness or gross deformity; normal excursion   Heart:    Regular rate and rhythm, S1 and S2 normal, no murmur, rub   or gallop  Abdomen:     Soft, non-tender, bowel sounds active all four quadrants, No G/R/R, no masses, no organomegaly  Genitalia:   Deferred.  Rectal:   Deferred.  Extremities:   Extremities normal, atraumatic, no cyanosis or gross edema  Pulses:   2+ and symmetric all extremities  Skin:   Warm, dry, Skin color, texture, turgor normal, no obvious rashes or lesions  M-Sk:   Ambulates * 4 w/o difficulty, no gross deformities, tone WNL  Neurologic:   CNII-XII intact, normal strength, sensation and reflexes    Throughout Psych:  No HI/SI, judgement and insight good, Euthymic mood. Full Affect.

## 2019-12-21 NOTE — Patient Instructions (Signed)
Preventive Care 40-54 Years Old, Male Preventive care refers to lifestyle choices and visits with your health care provider that can promote health and wellness. This includes:  A yearly physical exam. This is also called an annual well check.  Regular dental and eye exams.  Immunizations.  Screening for certain conditions.  Healthy lifestyle choices, such as eating a healthy diet, getting regular exercise, not using drugs or products that contain nicotine and tobacco, and limiting alcohol use. What can I expect for my preventive care visit? Physical exam Your health care provider will check:  Height and weight. These may be used to calculate body mass index (BMI), which is a measurement that tells if you are at a healthy weight.  Heart rate and blood pressure.  Your skin for abnormal spots. Counseling Your health care provider may ask you questions about:  Alcohol, tobacco, and drug use.  Emotional well-being.  Home and relationship well-being.  Sexual activity.  Eating habits.  Work and work environment. What immunizations do I need?  Influenza (flu) vaccine  This is recommended every year. Tetanus, diphtheria, and pertussis (Tdap) vaccine  You may need a Td booster every 10 years. Varicella (chickenpox) vaccine  You may need this vaccine if you have not already been vaccinated. Zoster (shingles) vaccine  You may need this after age 60. Measles, mumps, and rubella (MMR) vaccine  You may need at least one dose of MMR if you were born in 1957 or later. You may also need a second dose. Pneumococcal conjugate (PCV13) vaccine  You may need this if you have certain conditions and were not previously vaccinated. Pneumococcal polysaccharide (PPSV23) vaccine  You may need one or two doses if you smoke cigarettes or if you have certain conditions. Meningococcal conjugate (MenACWY) vaccine  You may need this if you have certain conditions. Hepatitis A  vaccine  You may need this if you have certain conditions or if you travel or work in places where you may be exposed to hepatitis A. Hepatitis B vaccine  You may need this if you have certain conditions or if you travel or work in places where you may be exposed to hepatitis B. Haemophilus influenzae type b (Hib) vaccine  You may need this if you have certain risk factors. Human papillomavirus (HPV) vaccine  If recommended by your health care provider, you may need three doses over 6 months. You may receive vaccines as individual doses or as more than one vaccine together in one shot (combination vaccines). Talk with your health care provider about the risks and benefits of combination vaccines. What tests do I need? Blood tests  Lipid and cholesterol levels. These may be checked every 5 years, or more frequently if you are over 50 years old.  Hepatitis C test.  Hepatitis B test. Screening  Lung cancer screening. You may have this screening every year starting at age 55 if you have a 30-pack-year history of smoking and currently smoke or have quit within the past 15 years.  Prostate cancer screening. Recommendations will vary depending on your family history and other risks.  Colorectal cancer screening. All adults should have this screening starting at age 50 and continuing until age 75. Your health care provider may recommend screening at age 45 if you are at increased risk. You will have tests every 1-10 years, depending on your results and the type of screening test.  Diabetes screening. This is done by checking your blood sugar (glucose) after you have not eaten   for a while (fasting). You may have this done every 1-3 years.  Sexually transmitted disease (STD) testing. Follow these instructions at home: Eating and drinking  Eat a diet that includes fresh fruits and vegetables, whole grains, lean protein, and low-fat dairy products.  Take vitamin and mineral supplements as  recommended by your health care provider.  Do not drink alcohol if your health care provider tells you not to drink.  If you drink alcohol: ? Limit how much you have to 0-2 drinks a day. ? Be aware of how much alcohol is in your drink. In the [place] one drink equals one 12 oz bottle of beer (355 mL), one 5 oz glass of wine (148 mL), or one 1 oz glass of hard liquor (44 mL). Lifestyle  Take daily care of your teeth and gums.  Stay active. Exercise for at least 30 minutes on 5 or more days each week.  Do not use any products that contain nicotine or tobacco, such as cigarettes, e-cigarettes, and chewing tobacco. If you need help quitting, ask your health care provider.  If you are sexually active, practice safe sex. Use a condom or other form of protection to prevent STIs (sexually transmitted infections).  Talk with your health care provider about taking a low-dose aspirin every day starting at age 50. What's next?  Go to your health care provider once a year for a well check visit.  Ask your health care provider how often you should have your eyes and teeth checked.  Stay up to date on all vaccines. This information is not intended to replace advice given to you by your health care provider. Make sure you discuss any questions you have with your health care provider. Document Revised: 07/28/2018 Document Reviewed: 07/28/2018 Elsevier Patient Education  2020 [employer] Inc[employer]  

## 2019-12-22 LAB — HEMOGLOBIN A1C
Est. average glucose Bld gHb Est-mCnc: 117 mg/dL
Hgb A1c MFr Bld: 5.7 % — ABNORMAL HIGH (ref 4.8–5.6)

## 2019-12-22 LAB — COMPREHENSIVE METABOLIC PANEL
ALT: 21 IU/L (ref 0–44)
AST: 17 IU/L (ref 0–40)
Albumin/Globulin Ratio: 1.6 (ref 1.2–2.2)
Albumin: 4.1 g/dL (ref 3.8–4.9)
Alkaline Phosphatase: 109 IU/L (ref 39–117)
BUN/Creatinine Ratio: 12 (ref 9–20)
BUN: 13 mg/dL (ref 6–24)
Bilirubin Total: 0.4 mg/dL (ref 0.0–1.2)
CO2: 25 mmol/L (ref 20–29)
Calcium: 9 mg/dL (ref 8.7–10.2)
Chloride: 105 mmol/L (ref 96–106)
Creatinine, Ser: 1.06 mg/dL (ref 0.76–1.27)
GFR calc Af Amer: 92 mL/min/{1.73_m2} (ref >59)
GFR calc non Af Amer: 80 mL/min/{1.73_m2} (ref >59)
Globulin, Total: 2.5 g/dL (ref 1.5–4.5)
Glucose: 91 mg/dL (ref 65–99)
Potassium: 4.2 mmol/L (ref 3.5–5.2)
Sodium: 143 mmol/L (ref 134–144)
Total Protein: 6.6 g/dL (ref 6.0–8.5)

## 2019-12-22 LAB — LIPID PANEL
Chol/HDL Ratio: 3.7 ratio (ref 0.0–5.0)
Cholesterol, Total: 115 mg/dL (ref 100–199)
HDL: 31 mg/dL — ABNORMAL LOW (ref >39)
LDL Chol Calc (NIH): 69 mg/dL (ref 0–99)
Triglycerides: 73 mg/dL (ref 0–149)
VLDL Cholesterol Cal: 15 mg/dL (ref 5–40)

## 2019-12-22 LAB — CBC
Hematocrit: 44.8 % (ref 37.5–51.0)
Hemoglobin: 15 g/dL (ref 13.0–17.7)
MCH: 30.9 pg (ref 26.6–33.0)
MCHC: 33.5 g/dL (ref 31.5–35.7)
MCV: 92 fL (ref 79–97)
Platelets: 212 10*3/uL (ref 150–450)
RBC: 4.86 x10E6/uL (ref 4.14–5.80)
RDW: 13 % (ref 11.6–15.4)
WBC: 5.7 10*3/uL (ref 3.4–10.8)

## 2019-12-22 LAB — TSH: TSH: 0.897 u[IU]/mL (ref 0.450–4.500)

## 2019-12-22 NOTE — Telephone Encounter (Signed)
Fonnie Mu, CMA  Leyland Kenna, Patrecia Pace, CMA  Hey.  Herb Grays wants you to add on a PSA for this patient when you call to add on the Vitamin D please.  TY      Both Labs added with additional dx code vit d deficency and fatigue.AS, CMA

## 2019-12-26 LAB — SPECIMEN STATUS REPORT

## 2019-12-26 LAB — PSA: Prostate Specific Ag, Serum: 1.3 ng/mL (ref 0.0–4.0)

## 2019-12-26 LAB — VITAMIN D 25 HYDROXY (VIT D DEFICIENCY, FRACTURES): Vit D, 25-Hydroxy: 39.8 ng/mL (ref 30.0–100.0)

## 2019-12-27 MED ORDER — VITAMIN D (ERGOCALCIFEROL) 1.25 MG (50000 UNIT) PO CAPS: ORAL_CAPSULE | ORAL | 0 refills | Status: DC

## 2020-02-26 ENCOUNTER — Other Ambulatory Visit: Payer: Self-pay | Admitting: Physician Assistant

## 2020-02-26 DIAGNOSIS — R5383 Other fatigue: Secondary | ICD-10-CM

## 2020-02-26 DIAGNOSIS — E559 Vitamin D deficiency, unspecified: Secondary | ICD-10-CM

## 2020-02-26 NOTE — Telephone Encounter (Signed)
Please call pt to schedule non fasting lab visit only for Vitamin D.  This must be rechecked before we can authorize any refills.  Charyl Bigger, CMA

## 2020-04-24 ENCOUNTER — Ambulatory Visit: Payer: 59 | Admitting: Physician Assistant

## 2020-05-14 ENCOUNTER — Encounter: Payer: Self-pay | Admitting: Physician Assistant

## 2020-05-14 ENCOUNTER — Other Ambulatory Visit: Payer: Self-pay

## 2020-05-14 ENCOUNTER — Ambulatory Visit (INDEPENDENT_AMBULATORY_CARE_PROVIDER_SITE_OTHER): Payer: 59 | Admitting: Physician Assistant

## 2020-05-14 VITALS — BP 120/71 | HR 68 | Ht 70.0 in | Wt 222.4 lb

## 2020-05-14 DIAGNOSIS — F4329 Adjustment disorder with other symptoms: Secondary | ICD-10-CM

## 2020-05-14 DIAGNOSIS — I10 Essential (primary) hypertension: Secondary | ICD-10-CM | POA: Diagnosis not present

## 2020-05-14 DIAGNOSIS — H6690 Otitis media, unspecified, unspecified ear: Secondary | ICD-10-CM

## 2020-05-14 DIAGNOSIS — R0981 Nasal congestion: Secondary | ICD-10-CM

## 2020-05-14 DIAGNOSIS — E559 Vitamin D deficiency, unspecified: Secondary | ICD-10-CM

## 2020-05-14 DIAGNOSIS — E785 Hyperlipidemia, unspecified: Secondary | ICD-10-CM | POA: Diagnosis not present

## 2020-05-14 MED ORDER — ATORVASTATIN CALCIUM 80 MG PO TABS: ORAL_TABLET | ORAL | 1 refills | Status: DC

## 2020-05-14 MED ORDER — FLUOXETINE HCL 20 MG PO CAPS: 20.0000 mg | ORAL_CAPSULE | Freq: Every day | ORAL | 1 refills | Status: DC

## 2020-05-14 MED ORDER — VITAMIN D (ERGOCALCIFEROL) 1.25 MG (50000 UNIT) PO CAPS: ORAL_CAPSULE | ORAL | 0 refills | Status: DC

## 2020-05-14 MED ORDER — LOSARTAN POTASSIUM-HCTZ 100-12.5 MG PO TABS: 1.0000 | ORAL_TABLET | Freq: Every day | ORAL | 1 refills | Status: DC

## 2020-05-14 MED ORDER — AMOXICILLIN-POT CLAVULANATE 875-125 MG PO TABS: 1.0000 | ORAL_TABLET | Freq: Two times a day (BID) | ORAL | 0 refills | Status: DC

## 2020-05-14 NOTE — Patient Instructions (Signed)
Vitamin D Deficiency Vitamin D deficiency is when your body does not have enough vitamin D. Vitamin D is important to your body because:  It helps your body use other minerals.  It helps to keep your bones strong and healthy.  It may help to prevent some diseases.  It helps your heart and other muscles work well. Not getting enough vitamin D can make your bones soft. It can also cause other health problems. What are the causes? This condition may be caused by:  Not eating enough foods that contain vitamin D.  Not getting enough sun.  Having diseases that make it hard for your body to absorb vitamin D.  Having a surgery in which a part of the stomach or a part of the small intestine is removed.  Having kidney disease or liver disease. What increases the risk? You are more likely to get this condition if:  You are older.  You do not spend much time outdoors.  You live in a nursing home.  You have had broken bones.  You have weak or thin bones (osteoporosis).  You have a disease or condition that changes how your body absorbs vitamin D.  You have dark skin.  You take certain medicines.  You are overweight or obese. What are the signs or symptoms?  In mild cases, there may not be any symptoms. If the condition is very bad, symptoms may include: ? Bone pain. ? Muscle pain. ? Falling often. ? Broken bones caused by a minor injury. How is this treated? Treatment may include taking supplements as told by your doctor. Your doctor will tell you what dose is best for you. Supplements may include:  Vitamin D.  Calcium. Follow these instructions at home: Eating and drinking   Eat foods that contain vitamin D, such as: ? Dairy products, cereals, or juices with added vitamin D. Check the label. ? Fish, such as salmon or trout. ? Eggs. ? Oysters. ? Mushrooms. The items listed above may not be a complete list of what you can eat and drink. Contact a dietitian for more  options. General instructions  Take medicines and supplements only as told by your doctor.  Get regular, safe exposure to natural sunlight.  Do not use a tanning bed.  Maintain a healthy weight. Lose weight if needed.  Keep all follow-up visits as told by your doctor. This is important. How is this prevented?  You can get vitamin D by: ? Eating foods that naturally contain vitamin D. ? Eating or drinking products that have vitamin D added to them, such as cereals, juices, and milk. ? Taking vitamin D or a multivitamin that contains vitamin D. ? Being in the sun. Your body makes vitamin D when your skin is exposed to sunlight. Your body changes the sunlight into a form of the vitamin that it can use. Contact a doctor if:  Your symptoms do not go away.  You feel sick to your stomach (nauseous).  You throw up (vomit).  You poop less often than normal, or you have trouble pooping (constipation). Summary  Vitamin D deficiency is when your body does not have enough vitamin D.  Vitamin D helps to keep your bones strong and healthy.  This condition is often treated by taking a supplement.  Your doctor will tell you what dose is best for you. This information is not intended to replace advice given to you by your health care provider. Make sure you discuss any questions you  have with your health care provider. Document Revised: 04/11/2018 Document Reviewed: 04/11/2018 Elsevier Patient Education  2020 [employer] Inc.  

## 2020-05-14 NOTE — Assessment & Plan Note (Signed)
-  BP stable -Continue current medication regimen. -Follow a low sodium diet. -Checking CMP today for medication monitoring.

## 2020-05-14 NOTE — Progress Notes (Signed)
Established Patient Office Visit  Subjective:  Patient ID: Brian Gay, male    DOB: [birth year]  Age: 54 y.o. MRN: [id number]  CC:  Chief Complaint  Patient presents with  . Hypertension  . Hyperlipidemia    HPI Brian Gay presents for follow up on hypertension and hyperlipidemia. Complaining of left ear discomfort. Reports he has chronic sinus issues and has been experiencing symptoms of nasal congestion and feels stopped up. Denies fever, chills, or night sweats.  HTN: Pt denies chest pain, palpitations, dizziness or lower extremity swelling. Taking medication as directed without side effects. Doesn't check BP at home because hasn't felt like his BP has been running high.   HLD: Pt taking medication as directed without issues. Reports muscles aches but is not sure if it is related to statin.   Mood: Reports Prozac continues to keep his mood stable. Has no concerns.   Past Medical History:  Diagnosis Date  . Allergy   . Hypertension   . Kidney stones     Past Surgical History:  Procedure Laterality Date  . CLOSED REDUCTION FINGER WITH PERCUTANEOUS PINNING Right 03/19/2016   Procedure: CLOSED REDUCTION right ring  FINGER;  Surgeon: Daryll Brod, MD;  Location: [facility];  Service: Orthopedics;  Laterality: Right;  . cyst removed      pilonidal cyst  . WISDOM TOOTH EXTRACTION      Family History  Problem Relation Age of Onset  . Heart disease Mother   . Hypertension Mother   . Heart disease Father   . Cancer Father        prostate  . Prostate cancer Father   . Hyperlipidemia Father   . Stroke Father   . Heart disease Maternal Grandmother   . Heart disease Maternal Grandfather   . Heart disease Paternal Grandmother   . Heart disease Paternal Grandfather   . Colon cancer Neg Hx   . Colon polyps Neg Hx   . Rectal cancer Neg Hx   . Stomach cancer Neg Hx     Social History   Socioeconomic History  . Marital status: Married    Spouse name: Tammy   . Number of children: 1  . Years of education: 4  . Highest education level: Not on file  Occupational History  . Occupation: [employer] Supervisor    Employer: UNEMPLOYED  Tobacco Use  . Smoking status: Former Smoker    Packs/day: 0.50    Years: 20.00    Pack years: 10.00    Quit date: 07/2016    Years since quitting: 3.8  . Smokeless tobacco: Current User    Types: Chew  Vaping Use  . Vaping Use: Never used  Substance and Sexual Activity  . Alcohol use: Yes    Alcohol/week: 0.0 standard drinks    Comment: occasional  . Drug use: No  . Sexual activity: Yes    Partners: Female  Other Topics Concern  . Not on file  Social History Narrative   Married since 1984.   Social Determinants of Health   Financial Resource Strain:   . Difficulty of Paying Living Expenses: Not on file  Food Insecurity:   . Worried About Charity fundraiser in the Last Year: Not on file  . Ran Out of Food in the Last Year: Not on file  Transportation Needs:   . Lack of Transportation (Medical): Not on file  . Lack of Transportation (Non-Medical): Not on file  Physical Activity:   .  Days of Exercise per Week: Not on file  . Minutes of Exercise per Session: Not on file  Stress:   . Feeling of Stress : Not on file  Social Connections:   . Frequency of Communication with Friends and Family: Not on file  . Frequency of Social Gatherings with Friends and Family: Not on file  . Attends Religious Services: Not on file  . Active Member of Clubs or Organizations: Not on file  . Attends Archivist Meetings: Not on file  . Marital Status: Not on file  Intimate Partner Violence:   . Fear of Current or Ex-Partner: Not on file  . Emotionally Abused: Not on file  . Physically Abused: Not on file  . Sexually Abused: Not on file    Outpatient Medications Prior to Visit  Medication Sig Dispense Refill  . Cholecalciferol (VITAMIN D3) 5000 units TABS 5,000 IU OTC vitamin D3  daily. 90 tablet 3  . atorvastatin (LIPITOR) 80 MG tablet 1 tablet nightly before bedtime 90 tablet 3  . FLUoxetine (PROZAC) 20 MG capsule Take 1 capsule (20 mg total) by mouth daily. 90 capsule 1  . losartan-hydrochlorothiazide (HYZAAR) 100-12.5 MG tablet Take 1 tablet by mouth daily. 90 tablet 3  . Vitamin D, Ergocalciferol, (DRISDOL) 1.25 MG (50000 UNIT) CAPS capsule Take 1 capsule once weekly 8 capsule 0   No facility-administered medications prior to visit.    Allergies  Allergen Reactions  . Codeine Swelling    ROS Review of Systems  A fourteen system review of systems was performed and found to be positive as per HPI.   Objective:    Physical Exam General:  Well Developed, well nourished, in no acute distress  Neuro:  Alert and oriented,  extra-ocular muscles intact, no focal deficits  HEENT:  Normocephalic, atraumatic, Left TM erythematous and mildly bulging with effusion, Right TM normal, boggy turbinates, neck supple, no adenopathy Skin:  no gross rash, warm, pink. Cardiac:  RRR, S1 S2 Respiratory:  ECTA B/L and A/P, Not using accessory muscles, speaking in full sentences- unlabored. Vascular:  Ext warm, no cyanosis apprec.; cap RF less 2 sec. Psych:  No HI/SI, judgement and insight good, Euthymic mood. Full Affect.   BP 120/71   Pulse 68   Ht _0  (1.778 m)   Wt 222 lb 6.4 oz (100.9 kg)   SpO2 95%   BMI 31.91 kg/m  Wt Readings from Last 3 Encounters:  05/14/20 222 lb 6.4 oz (100.9 kg)  12/21/19 215 lb 8 oz (97.8 kg)  07/04/19 215 lb (97.5 kg)     Health Maintenance Due  Topic Date Due  . COVID-19 Vaccine (1) Never done  . INFLUENZA VACCINE  03/17/2020    There are no preventive care reminders to display for this patient.  Lab Results  Component Value Date   TSH 0.897 12/21/2019   Lab Results  Component Value Date   WBC 5.7 12/21/2019   HGB 15.0 12/21/2019   HCT 44.8 12/21/2019   MCV 92 12/21/2019   PLT 212 12/21/2019   Lab Results   Component Value Date   NA 143 12/21/2019   K 4.2 12/21/2019   CO2 25 12/21/2019   GLUCOSE 91 12/21/2019   BUN 13 12/21/2019   CREATININE 1.06 12/21/2019   BILITOT 0.4 12/21/2019   ALKPHOS 109 12/21/2019   AST 17 12/21/2019   ALT 21 12/21/2019   PROT 6.6 12/21/2019   ALBUMIN 4.1 12/21/2019   CALCIUM 9.0 12/21/2019  GFR 83.12 12/12/2015   Lab Results  Component Value Date   CHOL 115 12/21/2019   Lab Results  Component Value Date   HDL 31 (L) 12/21/2019   Lab Results  Component Value Date   LDLCALC 69 12/21/2019   Lab Results  Component Value Date   TRIG 73 12/21/2019   Lab Results  Component Value Date   CHOLHDL 3.7 12/21/2019   Lab Results  Component Value Date   HGBA1C 5.7 (H) 12/21/2019      Assessment & Plan:   Problem List Items Addressed This Visit      Cardiovascular and Mediastinum   Essential hypertension - Primary (Chronic)    -BP stable -Continue current medication regimen. -Follow a low sodium diet. -Checking CMP today for medication monitoring.      Relevant Medications   atorvastatin (LIPITOR) 80 MG tablet   losartan-hydrochlorothiazide (HYZAAR) 100-12.5 MG tablet   Other Relevant Orders   Comp Met (CMET)     Other   Vitamin D deficiency (Chronic)    -Last vitamin D low -Continue Vitamin D supplement. Provided requested refill. -Rechecking Vit D today.      Relevant Medications   Vitamin D, Ergocalciferol, (DRISDOL) 1.25 MG (50000 UNIT) CAPS capsule   Other Relevant Orders   Vitamin D (25 hydroxy)   Stress and adjustment reaction (Chronic)    -Stable -Continue current medication regimen. -Will continue to monitor.       Relevant Medications   FLUoxetine (PROZAC) 20 MG capsule   Hyperlipidemia    -Last lipid panel wnl -Continue current medication regimen. Discussed with patient the possibility of changing Lipitor to Crestor if continues to experience myalgias to see if possibly related to statin side effects. Pt  verbalized understanding and will consider if myalgias worsen. -Follow a heart healthy diet. -Will continue to monitor.      Relevant Medications   atorvastatin (LIPITOR) 80 MG tablet   losartan-hydrochlorothiazide (HYZAAR) 100-12.5 MG tablet    Other Visit Diagnoses    Ear infection       Relevant Medications   amoxicillin-clavulanate (AUGMENTIN) 875-125 MG tablet   Nasal congestion         Ear infection, Nasal congestion: -Discussed with patient signs of ear infection present and will start Augmentin, which will also provide symptom relief for sinus infection. -Recommend to use Flonase, do nasal rinses, and can take a decongestant for 2-3 days and closely monitor blood pressure and pulse to help with nasal congestion. -Follow up if symptoms fail to improve or worsen.  Meds ordered this encounter  Medications  . atorvastatin (LIPITOR) 80 MG tablet    Sig: 1 tablet nightly before bedtime    Dispense:  90 tablet    Refill:  1  . FLUoxetine (PROZAC) 20 MG capsule    Sig: Take 1 capsule (20 mg total) by mouth daily.    Dispense:  90 capsule    Refill:  1  . losartan-hydrochlorothiazide (HYZAAR) 100-12.5 MG tablet    Sig: Take 1 tablet by mouth daily.    Dispense:  90 tablet    Refill:  1  . Vitamin D, Ergocalciferol, (DRISDOL) 1.25 MG (50000 UNIT) CAPS capsule    Sig: Take 1 capsule once weekly    Dispense:  24 capsule    Refill:  0  . amoxicillin-clavulanate (AUGMENTIN) 875-125 MG tablet    Sig: Take 1 tablet by mouth 2 (two) times daily.    Dispense:  20 tablet    Refill:  0    Follow-up: Return in about 4 months (around 09/13/2020) for HTN, Mood, PreDM.    Lorrene Reid, PA-C

## 2020-05-14 NOTE — Assessment & Plan Note (Deleted)
-  Last lipid panel wnl -Continue current medication regimen. Discussed with patient the possibility of changing Lipitor to Crestor if continues to experience myalgias to see if possibly related to statin side effects. Pt verbalized understanding and will consider if myalgias worsen. -Follow a heart healthy diet. -Will continue to monitor.

## 2020-05-14 NOTE — Assessment & Plan Note (Signed)
-  Last vitamin D low -Continue Vitamin D supplement. Provided requested refill. -Rechecking Vit D today.

## 2020-05-14 NOTE — Assessment & Plan Note (Signed)
-  Last lipid panel wnl -Continue current medication regimen. Discussed with patient the possibility of changing Lipitor to Crestor if continues to experience myalgias to see if possibly related to statin side effects. Pt verbalized understanding and will consider if myalgias worsen. -Follow a heart healthy diet. -Will continue to monitor.

## 2020-05-14 NOTE — Assessment & Plan Note (Signed)
-  Stable. -Continue current medication regimen.  -Will continue to monitor. 

## 2020-05-15 LAB — COMPREHENSIVE METABOLIC PANEL
ALT: 21 IU/L (ref 0–44)
AST: 14 IU/L (ref 0–40)
Albumin/Globulin Ratio: 2 (ref 1.2–2.2)
Albumin: 4.4 g/dL (ref 3.8–4.9)
Alkaline Phosphatase: 125 IU/L — ABNORMAL HIGH (ref 44–121)
BUN/Creatinine Ratio: 14 (ref 9–20)
BUN: 13 mg/dL (ref 6–24)
Bilirubin Total: 0.2 mg/dL (ref 0.0–1.2)
CO2: 22 mmol/L (ref 20–29)
Calcium: 9.3 mg/dL (ref 8.7–10.2)
Chloride: 104 mmol/L (ref 96–106)
Creatinine, Ser: 0.91 mg/dL (ref 0.76–1.27)
GFR calc Af Amer: 110 mL/min/{1.73_m2} (ref >59)
GFR calc non Af Amer: 95 mL/min/{1.73_m2} (ref >59)
Globulin, Total: 2.2 g/dL (ref 1.5–4.5)
Glucose: 91 mg/dL (ref 65–99)
Potassium: 3.8 mmol/L (ref 3.5–5.2)
Sodium: 142 mmol/L (ref 134–144)
Total Protein: 6.6 g/dL (ref 6.0–8.5)

## 2020-05-15 LAB — VITAMIN D 25 HYDROXY (VIT D DEFICIENCY, FRACTURES): Vit D, 25-Hydroxy: 36.7 ng/mL (ref 30.0–100.0)

## 2020-09-11 ENCOUNTER — Other Ambulatory Visit: Payer: Self-pay

## 2020-09-11 ENCOUNTER — Ambulatory Visit (INDEPENDENT_AMBULATORY_CARE_PROVIDER_SITE_OTHER): Payer: 59 | Admitting: Physician Assistant

## 2020-09-11 ENCOUNTER — Encounter: Payer: Self-pay | Admitting: Physician Assistant

## 2020-09-11 VITALS — BP 116/73 | HR 86 | Ht 70.0 in | Wt 221.8 lb

## 2020-09-11 DIAGNOSIS — H6690 Otitis media, unspecified, unspecified ear: Secondary | ICD-10-CM

## 2020-09-11 DIAGNOSIS — I10 Essential (primary) hypertension: Secondary | ICD-10-CM | POA: Diagnosis not present

## 2020-09-11 DIAGNOSIS — F4329 Adjustment disorder with other symptoms: Secondary | ICD-10-CM | POA: Diagnosis not present

## 2020-09-11 DIAGNOSIS — R7303 Prediabetes: Secondary | ICD-10-CM | POA: Diagnosis not present

## 2020-09-11 MED ORDER — CEFDINIR 300 MG PO CAPS: 600.0000 mg | ORAL_CAPSULE | Freq: Every day | ORAL | 0 refills | Status: DC

## 2020-09-11 NOTE — Progress Notes (Signed)
Established Patient Office Visit  Subjective:  Patient ID: Brian Gay, male    DOB: 1965-09-02  Age: 55 y.o. MRN: [id number]  CC:  Chief Complaint  Patient presents with  . Hypertension  . Depression  . Prediabetes    HPI Brian Gay presents for follow up on hypertension, prediabetes and mood. Has c/o of right earache. Denies fever, otorrhea, or chills.  Does have a history of recurrent ear infections. Has not seen an ENT specialist in the past.  HTN: Pt denies chest pain, palpitations, dizziness or lower extremity swelling. Taking medication as directed without side effects. Pt follows a low salt diet. Reports does not drink much water.  Prediabetes: Denies increased thirst or urination. Likes to drink sodas instead of water.  Mood: States has been on Prozac for maybe 3 years. Medication helps keep him calm, especially during stressful situations.  Past Medical History:  Diagnosis Date  . Allergy   . Hypertension   . Kidney stones     Past Surgical History:  Procedure Laterality Date  . CLOSED REDUCTION FINGER WITH PERCUTANEOUS PINNING Right 03/19/2016   Procedure: CLOSED REDUCTION right ring  FINGER;  Surgeon: Daryll Brod, MD;  Location: [facility];  Service: Orthopedics;  Laterality: Right;  . cyst removed      pilonidal cyst  . WISDOM TOOTH EXTRACTION      Family History  Problem Relation Age of Onset  . Heart disease Mother   . Hypertension Mother   . Heart disease Father   . Cancer Father        prostate  . Prostate cancer Father   . Hyperlipidemia Father   . Stroke Father   . Heart disease Maternal Grandmother   . Heart disease Maternal Grandfather   . Heart disease Paternal Grandmother   . Heart disease Paternal Grandfather   . Colon cancer Neg Hx   . Colon polyps Neg Hx   . Rectal cancer Neg Hx   . Stomach cancer Neg Hx     Social History   Socioeconomic History  . Marital status: Married    Spouse name: Tammy  . Number of  children: 1  . Years of education: 110  . Highest education level: Not on file  Occupational History  . Occupation: [employer] Supervisor    Employer: UNEMPLOYED  Tobacco Use  . Smoking status: Former Smoker    Packs/day: 0.50    Years: 20.00    Pack years: 10.00    Quit date: 07/2016    Years since quitting: 4.1  . Smokeless tobacco: Current User    Types: Chew  Vaping Use  . Vaping Use: Never used  Substance and Sexual Activity  . Alcohol use: Yes    Alcohol/week: 0.0 standard drinks    Comment: occasional  . Drug use: No  . Sexual activity: Yes    Partners: Female  Other Topics Concern  . Not on file  Social History Narrative   Married since 1984.   Social Determinants of Health   Financial Resource Strain: Not on file  Food Insecurity: Not on file  Transportation Needs: Not on file  Physical Activity: Not on file  Stress: Not on file  Social Connections: Not on file  Intimate Partner Violence: Not on file    Outpatient Medications Prior to Visit  Medication Sig Dispense Refill  . atorvastatin (LIPITOR) 80 MG tablet 1 tablet nightly before bedtime 90 tablet 1  . FLUoxetine (PROZAC) 20 MG  capsule Take 1 capsule (20 mg total) by mouth daily. 90 capsule 1  . losartan-hydrochlorothiazide (HYZAAR) 100-12.5 MG tablet Take 1 tablet by mouth daily. 90 tablet 1  . Cholecalciferol (VITAMIN D3) 5000 units TABS 5,000 IU OTC vitamin D3 daily. (Patient not taking: Reported on 09/11/2020) 90 tablet 3  . Vitamin D, Ergocalciferol, (DRISDOL) 1.25 MG (50000 UNIT) CAPS capsule Take 1 capsule once weekly (Patient not taking: Reported on 09/11/2020) 24 capsule 0  . amoxicillin-clavulanate (AUGMENTIN) 875-125 MG tablet Take 1 tablet by mouth 2 (two) times daily. (Patient not taking: Reported on 09/11/2020) 20 tablet 0   No facility-administered medications prior to visit.    Allergies  Allergen Reactions  . Codeine Swelling    ROS Review of Systems A fourteen system  review of systems was performed and found to be positive as per HPI.    Objective:    Physical Exam General:  Well Developed, well nourished, appropriate for stated age.  Neuro:  Alert and oriented,  extra-ocular muscles intact  HEENT:  Normocephalic, atraumatic, no TTP of frontal or maxillary sinus, opaque and erythematous right TM, mild erythema of left TM, negative Pinna and Tragus signs B/L. No adenopathy. Skin:  no gross rash, warm, pink. Cardiac:  RRR, S1 S2 Respiratory:  ECTA B/L and A/P, Not using accessory muscles, speaking in full sentences- unlabored. Vascular:  Ext warm, no cyanosis apprec.; cap RF less 2 sec. Psych:  No HI/SI, judgement and insight good, Euthymic mood. Full Affect.   BP 116/73   Pulse 86   Ht 5' 10" (1.778 m)   Wt 221 lb 12.8 oz (100.6 kg)   SpO2 97%   BMI 31.82 kg/m  Wt Readings from Last 3 Encounters:  09/11/20 221 lb 12.8 oz (100.6 kg)  05/14/20 222 lb 6.4 oz (100.9 kg)  12/21/19 215 lb 8 oz (97.8 kg)     Health Maintenance Due  Topic Date Due  . COVID-19 Vaccine (1) Never done  . INFLUENZA VACCINE  03/17/2020    There are no preventive care reminders to display for this patient.  Lab Results  Component Value Date   TSH 0.897 12/21/2019   Lab Results  Component Value Date   WBC 5.7 12/21/2019   HGB 15.0 12/21/2019   HCT 44.8 12/21/2019   MCV 92 12/21/2019   PLT 212 12/21/2019   Lab Results  Component Value Date   NA 140 09/11/2020   K 3.9 09/11/2020   CO2 21 09/11/2020   GLUCOSE 144 (H) 09/11/2020   BUN 13 09/11/2020   CREATININE 0.96 09/11/2020   BILITOT 0.2 09/11/2020   ALKPHOS 116 09/11/2020   AST 14 09/11/2020   ALT 20 09/11/2020   PROT 6.8 09/11/2020   ALBUMIN 4.2 09/11/2020   CALCIUM 9.2 09/11/2020   GFR 83.12 12/12/2015   Lab Results  Component Value Date   CHOL 115 12/21/2019   Lab Results  Component Value Date   HDL 31 (L) 12/21/2019   Lab Results  Component Value Date   LDLCALC 69 12/21/2019    Lab Results  Component Value Date   TRIG 73 12/21/2019   Lab Results  Component Value Date   CHOLHDL 3.7 12/21/2019   Lab Results  Component Value Date   HGBA1C 5.7 (H) 12/21/2019      Assessment & Plan:   Problem List Items Addressed This Visit      Cardiovascular and Mediastinum   Essential hypertension - Primary (Chronic)    -Controlled. -Continue current  medication regimen. -Stay well hydrated and follow low sodium diet. -Continue to stay as active as possible. -Will continue to monitor and repeat CMP with CPE.      Relevant Orders   Comp Met (CMET) (Completed)     Other   Stress and adjustment reaction (Chronic)    -PHQ-9 score of 0, controlled. -Continue current medication regimen. -Will continue to monitor.       Other Visit Diagnoses    Prediabetes       Acute otitis media, unspecified otitis media type       Relevant Medications   cefdinir (OMNICEF) 300 MG capsule     Prediabetes: -Last A1c 5.7. -Reduce/monitor carbohydrates and glucose. Stay as active as possible. -Will continue to monitor and repeat A1c with CPE.  Acute otitis media: -Discussed with patient considering referral to ENT if continues to have recurrent otitis media likely related to chronic sinusitis. -Will start antibiotic therapy with Omnicef. (has been treated with Augmentin various times).  Meds ordered this encounter  Medications  . cefdinir (OMNICEF) 300 MG capsule    Sig: Take 2 capsules (600 mg total) by mouth daily.    Dispense:  20 capsule    Refill:  0    Order Specific Question:   Supervising Provider    Answer:   Beatrice Lecher D [[id number]]    Follow-up: Return in about 4 months (around 01/09/2021) for CPE and FBW.   Note:  This note was prepared with assistance of Dragon voice recognition software. Occasional wrong-word or sound-a-like substitutions may have occurred due to the inherent limitations of voice recognition software.  Lorrene Reid, PA-C

## 2020-09-11 NOTE — Assessment & Plan Note (Signed)
-  PHQ-9 score of 0, controlled. -Continue current medication regimen. -Will continue to monitor.

## 2020-09-11 NOTE — Assessment & Plan Note (Signed)
-  Controlled. -Continue current medication regimen. -Stay well hydrated and follow low sodium diet. -Continue to stay as active as possible. -Will continue to monitor and repeat CMP with CPE.

## 2020-09-11 NOTE — Patient Instructions (Signed)
Prediabetes Prediabetes is when your blood sugar (blood glucose) level is higher than normal but not high enough for you to be diagnosed with type 2 diabetes. Having prediabetes puts you at risk for developing type 2 diabetes (type 2 diabetes mellitus). With certain lifestyle changes, you may be able to prevent or delay the onset of type 2 diabetes. This is important because type 2 diabetes can lead to serious complications, such as:  Heart disease.  Stroke.  Blindness.  Kidney disease.  Depression.  Poor circulation in the feet and legs. In severe cases, this could lead to surgical removal of a leg (amputation). What are the causes? The exact cause of prediabetes is not known. It may result from insulin resistance. Insulin resistance develops when cells in the body do not respond properly to insulin that the body makes. This can cause excess glucose to build up in the blood. High blood glucose (hyperglycemia) can develop. What increases the risk? The following factors may make you more likely to develop this condition:  You have a family member with type 2 diabetes.  You are older than 45 years.  You had a temporary form of diabetes during a pregnancy (gestational diabetes).  You had polycystic ovary syndrome (PCOS).  You are overweight or obese.  You are inactive (sedentary).  You have a history of heart disease, including problems with cholesterol levels, high levels of blood fats, or high blood pressure. What are the signs or symptoms? You may have no symptoms. If you do have symptoms, they may include:  Increased hunger.  Increased thirst.  Increased urination.  Vision changes, such as blurry vision.  Tiredness (fatigue). How is this diagnosed? This condition can be diagnosed with blood tests. Your blood glucose may be checked with one or more of the following tests:  A fasting blood glucose (FBG) test. You will not be allowed to eat (you will fast) for at least  8 hours before a blood sample is taken.  An A1C blood test (hemoglobin A1C). This test provides information about blood glucose levels over the previous 2?3 months.  An oral glucose tolerance test (OGTT). This test measures your blood glucose at two points in time: ? After fasting. This is your baseline level. ? Two hours after you drink a beverage that contains glucose. You may be diagnosed with prediabetes if:  Your FBG is 100?125 mg/dL (5.6-6.9 mmol/L).  Your A1C level is 5.7?6.4% (39-46 mmol/mol).  Your OGTT result is 140?199 mg/dL (7.8-11 mmol/L). These blood tests may be repeated to confirm your diagnosis.   How is this treated? Treatment may include dietary and lifestyle changes to help lower your blood glucose and prevent type 2 diabetes from developing. In some cases, medicine may be prescribed to help lower the risk of type 2 diabetes. Follow these instructions at home: Nutrition  Follow a healthy meal plan. This includes eating lean proteins, whole grains, legumes, fresh fruits and vegetables, low-fat dairy products, and healthy fats.  Follow instructions from your health care provider about eating or drinking restrictions.  Meet with a dietitian to create a healthy eating plan that is right for you.   Lifestyle  Do moderate-intensity exercise for at least 30 minutes a day on 5 or more days each week, or as told by your health care provider. A mix of activities may be best, such as: ? Brisk walking, swimming, biking, and weight lifting.  Lose weight as told by your health care provider. Losing 5-7% of your body   weight can reverse insulin resistance.  Do not drink alcohol if: ? Your health care provider tells you not to drink. ? You are pregnant, may be pregnant, or are planning to become pregnant.  If you drink alcohol: ? Limit how much you use to:  0-1 drink a day for women.  0-2 drinks a day for men. ? Be aware of how much alcohol is in your drink. In the [place]  one drink equals one 12 oz bottle of beer (355 mL), one 5 oz glass of wine (148 mL), or one 1 oz glass of hard liquor (44 mL). General instructions  Take over-the-counter and prescription medicines only as told by your health care provider. You may be prescribed medicines that help lower the risk of type 2 diabetes.  Do not use any products that contain nicotine or tobacco, such as cigarettes, e-cigarettes, and chewing tobacco. If you need help quitting, ask your health care provider.  Keep all follow-up visits. This is important. Where to find more information  American Diabetes Association: www.diabetes.org  Academy of Nutrition and Dietetics: www.eatright.org  American Heart Association: www.heart.org Contact a health care provider if:  You have any of these symptoms: ? Increased hunger. ? Increased urination. ? Increased thirst. ? Fatigue. ? Vision changes, such as blurry vision. Get help right away if you:  Have shortness of breath.  Feel confused.  Vomit or feel like you may vomit. Summary  Prediabetes is when your blood sugar (blood glucose)level is higher than normal but not high enough for you to be diagnosed with type 2 diabetes.  Having prediabetes puts you at risk for developing type 2 diabetes (type 2 diabetes mellitus).  Make lifestyle changes such as eating a healthy diet and exercising regularly to help prevent diabetes. Lose weight as told by your health care provider. This information is not intended to replace advice given to you by your health care provider. Make sure you discuss any questions you have with your health care provider. Document Revised: 11/02/2019 Document Reviewed: 11/02/2019 Elsevier Patient Education  2021 [employer] Inc.  

## 2020-09-12 LAB — COMPREHENSIVE METABOLIC PANEL
ALT: 20 IU/L (ref 0–44)
AST: 14 IU/L (ref 0–40)
Albumin/Globulin Ratio: 1.6 (ref 1.2–2.2)
Albumin: 4.2 g/dL (ref 3.8–4.9)
Alkaline Phosphatase: 116 IU/L (ref 44–121)
BUN/Creatinine Ratio: 14 (ref 9–20)
BUN: 13 mg/dL (ref 6–24)
Bilirubin Total: 0.2 mg/dL (ref 0.0–1.2)
CO2: 21 mmol/L (ref 20–29)
Calcium: 9.2 mg/dL (ref 8.7–10.2)
Chloride: 104 mmol/L (ref 96–106)
Creatinine, Ser: 0.96 mg/dL (ref 0.76–1.27)
GFR calc Af Amer: 103 mL/min/{1.73_m2} (ref >59)
GFR calc non Af Amer: 89 mL/min/{1.73_m2} (ref >59)
Globulin, Total: 2.6 g/dL (ref 1.5–4.5)
Glucose: 144 mg/dL — ABNORMAL HIGH (ref 65–99)
Potassium: 3.9 mmol/L (ref 3.5–5.2)
Sodium: 140 mmol/L (ref 134–144)
Total Protein: 6.8 g/dL (ref 6.0–8.5)

## 2020-09-16 ENCOUNTER — Telehealth: Payer: Self-pay | Admitting: Physician Assistant

## 2020-09-16 NOTE — Telephone Encounter (Signed)
patient came in for a copy of most recent labwork results. printed and gave to patient. thank you

## 2020-12-17 ENCOUNTER — Telehealth: Payer: Self-pay | Admitting: Physician Assistant

## 2020-12-17 DIAGNOSIS — R0683 Snoring: Secondary | ICD-10-CM

## 2020-12-17 NOTE — Telephone Encounter (Signed)
Patient is aware that he has an upcoming Physical, however, he is asking if he may have an order placed for Sleep Study- prior to appt.  He had a referral just before COVID hit, however was never contacted.   Please advise. Thank you

## 2020-12-17 NOTE — Telephone Encounter (Signed)
Order has been placed. AS, CMA 

## 2021-01-03 ENCOUNTER — Other Ambulatory Visit: Payer: Self-pay | Admitting: Physician Assistant

## 2021-01-03 DIAGNOSIS — R7303 Prediabetes: Secondary | ICD-10-CM

## 2021-01-03 DIAGNOSIS — I10 Essential (primary) hypertension: Secondary | ICD-10-CM

## 2021-01-03 DIAGNOSIS — E559 Vitamin D deficiency, unspecified: Secondary | ICD-10-CM

## 2021-01-03 DIAGNOSIS — Z0000 Encounter for general adult medical examination without abnormal findings: Secondary | ICD-10-CM

## 2021-01-03 DIAGNOSIS — E785 Hyperlipidemia, unspecified: Secondary | ICD-10-CM

## 2021-01-03 DIAGNOSIS — R5383 Other fatigue: Secondary | ICD-10-CM

## 2021-01-06 ENCOUNTER — Other Ambulatory Visit: Payer: Self-pay | Admitting: Physician Assistant

## 2021-01-06 ENCOUNTER — Other Ambulatory Visit: Payer: 59

## 2021-01-06 ENCOUNTER — Other Ambulatory Visit: Payer: Self-pay

## 2021-01-06 DIAGNOSIS — E559 Vitamin D deficiency, unspecified: Secondary | ICD-10-CM

## 2021-01-06 DIAGNOSIS — R0683 Snoring: Secondary | ICD-10-CM

## 2021-01-06 DIAGNOSIS — I10 Essential (primary) hypertension: Secondary | ICD-10-CM

## 2021-01-06 DIAGNOSIS — G4730 Sleep apnea, unspecified: Secondary | ICD-10-CM

## 2021-01-06 DIAGNOSIS — Z0000 Encounter for general adult medical examination without abnormal findings: Secondary | ICD-10-CM

## 2021-01-06 DIAGNOSIS — E785 Hyperlipidemia, unspecified: Secondary | ICD-10-CM

## 2021-01-06 DIAGNOSIS — R5383 Other fatigue: Secondary | ICD-10-CM

## 2021-01-06 DIAGNOSIS — R7303 Prediabetes: Secondary | ICD-10-CM

## 2021-01-07 LAB — COMPREHENSIVE METABOLIC PANEL
ALT: 17 IU/L (ref 0–44)
AST: 16 IU/L (ref 0–40)
Albumin/Globulin Ratio: 1.9 (ref 1.2–2.2)
Albumin: 4.3 g/dL (ref 3.8–4.9)
Alkaline Phosphatase: 113 IU/L (ref 44–121)
BUN/Creatinine Ratio: 15 (ref 9–20)
BUN: 15 mg/dL (ref 6–24)
Bilirubin Total: 0.6 mg/dL (ref 0.0–1.2)
CO2: 21 mmol/L (ref 20–29)
Calcium: 8.8 mg/dL (ref 8.7–10.2)
Chloride: 103 mmol/L (ref 96–106)
Creatinine, Ser: 0.99 mg/dL (ref 0.76–1.27)
Globulin, Total: 2.3 g/dL (ref 1.5–4.5)
Glucose: 93 mg/dL (ref 65–99)
Potassium: 3.7 mmol/L (ref 3.5–5.2)
Sodium: 140 mmol/L (ref 134–144)
Total Protein: 6.6 g/dL (ref 6.0–8.5)
eGFR: 90 mL/min/{1.73_m2} (ref >59)

## 2021-01-07 LAB — CBC
Hematocrit: 45.9 % (ref 37.5–51.0)
Hemoglobin: 15.5 g/dL (ref 13.0–17.7)
MCH: 30.8 pg (ref 26.6–33.0)
MCHC: 33.8 g/dL (ref 31.5–35.7)
MCV: 91 fL (ref 79–97)
Platelets: 193 10*3/uL (ref 150–450)
RBC: 5.03 x10E6/uL (ref 4.14–5.80)
RDW: 12.6 % (ref 11.6–15.4)
WBC: 6.1 10*3/uL (ref 3.4–10.8)

## 2021-01-07 LAB — LIPID PANEL
Chol/HDL Ratio: 5.7 ratio — ABNORMAL HIGH (ref 0.0–5.0)
Cholesterol, Total: 193 mg/dL (ref 100–199)
HDL: 34 mg/dL — ABNORMAL LOW (ref >39)
LDL Chol Calc (NIH): 140 mg/dL — ABNORMAL HIGH (ref 0–99)
Triglycerides: 101 mg/dL (ref 0–149)
VLDL Cholesterol Cal: 19 mg/dL (ref 5–40)

## 2021-01-07 LAB — TSH: TSH: 1.04 u[IU]/mL (ref 0.450–4.500)

## 2021-01-07 LAB — HEMOGLOBIN A1C
Est. average glucose Bld gHb Est-mCnc: 117 mg/dL
Hgb A1c MFr Bld: 5.7 % — ABNORMAL HIGH (ref 4.8–5.6)

## 2021-01-09 ENCOUNTER — Ambulatory Visit (INDEPENDENT_AMBULATORY_CARE_PROVIDER_SITE_OTHER): Payer: 59 | Admitting: Physician Assistant

## 2021-01-09 ENCOUNTER — Encounter: Payer: Self-pay | Admitting: Physician Assistant

## 2021-01-09 ENCOUNTER — Other Ambulatory Visit: Payer: Self-pay

## 2021-01-09 VITALS — BP 110/73 | HR 72 | Temp 98.9°F | Ht 70.0 in | Wt 222.8 lb

## 2021-01-09 DIAGNOSIS — F4329 Adjustment disorder with other symptoms: Secondary | ICD-10-CM

## 2021-01-09 DIAGNOSIS — Z23 Encounter for immunization: Secondary | ICD-10-CM

## 2021-01-09 DIAGNOSIS — Z0000 Encounter for general adult medical examination without abnormal findings: Secondary | ICD-10-CM | POA: Diagnosis not present

## 2021-01-09 DIAGNOSIS — Z8042 Family history of malignant neoplasm of prostate: Secondary | ICD-10-CM

## 2021-01-09 DIAGNOSIS — E785 Hyperlipidemia, unspecified: Secondary | ICD-10-CM

## 2021-01-09 DIAGNOSIS — I10 Essential (primary) hypertension: Secondary | ICD-10-CM

## 2021-01-09 DIAGNOSIS — E559 Vitamin D deficiency, unspecified: Secondary | ICD-10-CM

## 2021-01-09 DIAGNOSIS — Z125 Encounter for screening for malignant neoplasm of prostate: Secondary | ICD-10-CM

## 2021-01-09 DIAGNOSIS — H00012 Hordeolum externum right lower eyelid: Secondary | ICD-10-CM

## 2021-01-09 MED ORDER — FLUOXETINE HCL 20 MG PO CAPS: 20.0000 mg | ORAL_CAPSULE | Freq: Every day | ORAL | 1 refills | Status: DC

## 2021-01-09 MED ORDER — LOSARTAN POTASSIUM-HCTZ 100-12.5 MG PO TABS: 1.0000 | ORAL_TABLET | Freq: Every day | ORAL | 1 refills | Status: DC

## 2021-01-09 MED ORDER — ATORVASTATIN CALCIUM 80 MG PO TABS: ORAL_TABLET | ORAL | 1 refills | Status: DC

## 2021-01-09 MED ORDER — DOXYCYCLINE HYCLATE 100 MG PO TABS: 100.0000 mg | ORAL_TABLET | Freq: Two times a day (BID) | ORAL | 0 refills | Status: DC

## 2021-01-09 NOTE — Progress Notes (Signed)
Male physical   Impression and Recommendations:    1. Healthcare maintenance   2. Vitamin D deficiency   3. Screening for prostate cancer   4. Family history of prostate cancer   5. Need for shingles vaccine   6. Hyperlipidemia, unspecified hyperlipidemia type   7. Stress and adjustment reaction   8. Essential hypertension   9. Hordeolum of right lower eyelid, unspecified hordeolum type      1) Anticipatory Guidance: Skin CA prevention- recommend to use sunscreen when outside along with skin surveillance; eat a balanced and modest diet; physical activity at least 25 minutes per day or minimum of 150 min/ week moderate to intense activity.  2) Immunizations / Screenings / Labs:   All immunizations are up-to-date per recommendations or will be updated today if pt allows.    - Patient understands with dental and vision screens they will schedule independently.  - Obtained CBC, CMP, HgA1c, Lipid panel, TSH  when fasting. Most labs are essentially within normal limits or stable from prior with the exception of lipid panel.  - UTD on colonoscopy, Tdap, Hep C and HIV screenings. - Will obtain second dose of Shingrix.  - Will obtain Vit D and PSA.  3) Weight:  Recommend to improve diet habits to improve overall feelings of well being and objective health data. Improve nutrient density of diet through increasing intake of fruits and vegetables and decreasing saturated fats, white flour products and refined sugars.   4) Healthcare Maintenance: -Continue current medication. Provided refills. Recommend to improve med adherence with atorvastatin to improve cholesterol. -Will start oral antibiotic therapy for infected hordeolum. Apply warm compresses. -Continue good hydration. -Follow up in 4 months for HTN, HLD, mood, PreDM   Orders Placed This Encounter  Procedures  . Varicella-zoster vaccine IM (Shingrix)  . Vitamin D (25 hydroxy)  . PSA    Meds ordered this encounter   Medications  . atorvastatin (LIPITOR) 80 MG tablet    Sig: 1 tablet nightly before bedtime    Dispense:  90 tablet    Refill:  1    Order Specific Question:   Supervising Provider    Answer:   Beatrice Lecher D [[id number]]  . FLUoxetine (PROZAC) 20 MG capsule    Sig: Take 1 capsule (20 mg total) by mouth daily.    Dispense:  90 capsule    Refill:  1    Order Specific Question:   Supervising Provider    Answer:   Beatrice Lecher D [[id number]]  . losartan-hydrochlorothiazide (HYZAAR) 100-12.5 MG tablet    Sig: Take 1 tablet by mouth daily.    Dispense:  90 tablet    Refill:  1    Order Specific Question:   Supervising Provider    Answer:   Beatrice Lecher D [[id number]]  . doxycycline (VIBRA-TABS) 100 MG tablet    Sig: Take 1 tablet (100 mg total) by mouth 2 (two) times daily.    Dispense:  20 tablet    Refill:  0    Order Specific Question:   Supervising Provider    Answer:   Beatrice Lecher D [[id number]]     Return in about 4 months (around 05/12/2021) for HTN, HLD, mood.    Gross side effects, risk and benefits, and alternatives of medications discussed with patient.  Patient is aware that all medications have potential side effects and we are unable to predict every side effect or drug-drug interaction that may occur.  Expresses  verbal understanding and consents to current therapy plan and treatment regimen.  Please see AVS handed out to patient at the end of our visit for further patient instructions/ counseling done pertaining to today's office visit.      Subjective:       CC: CPE   HPI: Brian Gay is a 55 y.o. male who presents to [facility] today for a yearly health maintenance exam.     Health Maintenance Summary  - Reviewed and updated, unless pt declines services.  Last Cologuard or Colonoscopy:   01/31/2016- repeat in 10 years, non-cancerous polyps Tobacco History Reviewed: Y, former smoker with 10 yr pck hx Abdominal  Ultrasound:   N/A CT scan for screening lung CA:  N/A Alcohol / drug use:    No concerns, no excessive use / no use Dental Home: Y  Eye exams: N Male history: STD concerns:   none Additional penile/ urinary concerns:  none   Additional concerns beyond Health Maintenance issues:  none    Immunization History  Administered Date(s) Administered  . PFIZER(Purple Top)SARS-COV-2 Vaccination 10/24/2019, 11/14/2019  . Tdap 12/12/2015  . Zoster Recombinat (Shingrix) 12/21/2019, 01/09/2021     Health Maintenance  Topic Date Due  . Zoster Vaccines- Shingrix (1 of 2) 12/27/2015  . COVID-19 Vaccine (3 - Booster for Pfizer series) 04/15/2020  . INFLUENZA VACCINE  03/17/2021  . TETANUS/TDAP  12/11/2025  . COLONOSCOPY (Pts 45-39yrs Insurance coverage will need to be confirmed)  01/30/2026  . Hepatitis C Screening  Completed  . HIV Screening  Completed  . HPV VACCINES  Aged Out       Wt Readings from Last 3 Encounters:  01/09/21 222 lb 12.8 oz (101.1 kg)  09/11/20 221 lb 12.8 oz (100.6 kg)  05/14/20 222 lb 6.4 oz (100.9 kg)   BP Readings from Last 3 Encounters:  01/09/21 110/73  09/11/20 116/73  05/14/20 120/71   Pulse Readings from Last 3 Encounters:  01/09/21 72  09/11/20 86  05/14/20 68    Patient Active Problem List   Diagnosis Date Noted  . Flu-like symptoms 10/10/2018  . Obesity, Class I, BMI 30-34.9 09/19/2018  . Hyperlipidemia 05/12/2018  . Acute bronchitis 11/25/2017  . Acute maxillary sinusitis 11/25/2017  . Wheezing 11/25/2017  . Sinusitis 06/02/2017  . Stress and adjustment reaction 06/02/2017  . Low HDL (under 40) 01/26/2017  . Vitamin D deficiency 09/08/2016  . Tobacco abuse counseling 09/05/2016  . Elevated LDL cholesterol level 08/29/2016  . h/o Tobacco use disorder 08/04/2016  . Obese 08/04/2016  . Environmental and seasonal allergies 08/04/2016  . Fatigue- esp AM 08/04/2016  . Closed displaced fracture of middle phalanx of right ring finger  03/16/2016  . Volar plate injury of finger 03/16/2016  . Wellness examination 12/12/2015  . Essential hypertension 10/10/2015  . Allergic rhinitis 11/02/2012    Past Medical History:  Diagnosis Date  . Allergy   . Hypertension   . Kidney stones     Past Surgical History:  Procedure Laterality Date  . CLOSED REDUCTION FINGER WITH PERCUTANEOUS PINNING Right 03/19/2016   Procedure: CLOSED REDUCTION right ring  FINGER;  Surgeon: Daryll Brod, MD;  Location: [facility];  Service: Orthopedics;  Laterality: Right;  . cyst removed      pilonidal cyst  . WISDOM TOOTH EXTRACTION      Family History  Problem Relation Age of Onset  . Heart disease Mother   . Hypertension Mother   .  Heart disease Father   . Cancer Father        prostate  . Prostate cancer Father   . Hyperlipidemia Father   . Stroke Father   . Heart disease Maternal Grandmother   . Heart disease Maternal Grandfather   . Heart disease Paternal Grandmother   . Heart disease Paternal Grandfather   . Colon cancer Neg Hx   . Colon polyps Neg Hx   . Rectal cancer Neg Hx   . Stomach cancer Neg Hx     Social History   Substance and Sexual Activity  Drug Use No  ,  Social History   Substance and Sexual Activity  Alcohol Use Yes  . Alcohol/week: 0.0 standard drinks   Comment: occasional  ,  Social History   Tobacco Use  Smoking Status Former Smoker  . Packs/day: 0.50  . Years: 20.00  . Pack years: 10.00  . Quit date: 07/2016  . Years since quitting: 4.4  Smokeless Tobacco Current User  . Types: Chew  ,  Social History   Substance and Sexual Activity  Sexual Activity Yes  . Partners: Female    Patient's Medications  New Prescriptions   DOXYCYCLINE (VIBRA-TABS) 100 MG TABLET    Take 1 tablet (100 mg total) by mouth 2 (two) times daily.  Previous Medications   CHOLECALCIFEROL (VITAMIN D3) 5000 UNITS TABS    5,000 IU OTC vitamin D3 daily.   VITAMIN D, ERGOCALCIFEROL, (DRISDOL) 1.25 MG  (50000 UNIT) CAPS CAPSULE    Take 1 capsule once weekly  Modified Medications   Modified Medication Previous Medication   ATORVASTATIN (LIPITOR) 80 MG TABLET atorvastatin (LIPITOR) 80 MG tablet      1 tablet nightly before bedtime    1 tablet nightly before bedtime   FLUOXETINE (PROZAC) 20 MG CAPSULE FLUoxetine (PROZAC) 20 MG capsule      Take 1 capsule (20 mg total) by mouth daily.    Take 1 capsule (20 mg total) by mouth daily.   LOSARTAN-HYDROCHLOROTHIAZIDE (HYZAAR) 100-12.5 MG TABLET losartan-hydrochlorothiazide (HYZAAR) 100-12.5 MG tablet      Take 1 tablet by mouth daily.    Take 1 tablet by mouth daily.  Discontinued Medications   CEFDINIR (OMNICEF) 300 MG CAPSULE    Take 2 capsules (600 mg total) by mouth daily.    Codeine  Review of Systems: General:   Denies fever, chills, unexplained weight loss.  Optho/Auditory:   Denies visual changes, blurred vision/LOV Respiratory:   Denies SOB, DOE more than baseline levels.   Cardiovascular:   Denies chest pain, palpitations, new onset peripheral edema  Gastrointestinal:   Denies nausea, vomiting, diarrhea.  Genitourinary: Denies dysuria, freq/ urgency, flank pain  Endocrine:     Denies hot or cold intolerance, polyuria, polydipsia. Musculoskeletal:   Denies unexplained myalgias, joint swelling, unexplained arthralgias, gait problems.  Skin:  Denies rash, suspicious lesions  Neurological:     Denies dizziness, unexplained weakness, numbness  Psychiatric/Behavioral:   Denies mood changes, suicidal or homicidal ideations, hallucinations    Objective:     Blood pressure 110/73, pulse 72, temperature 98.9 F (37.2 C), height 5\' 10"  (1.778 m), weight 222 lb 12.8 oz (101.1 kg), SpO2 96 %. Body mass index is 31.97 kg/m. General Appearance:    Alert, cooperative, no distress, appears stated age  Head:    Normocephalic, without obvious abnormality, atraumatic  Eyes:    PERRL, conjunctiva/corneas clear, EOM's intact, fundi    benign,  both eyes; erythematous and swollen hordeolum of  right lower eyelid  Ears:    Normal TM's and external ear canals, both ears  Nose:   Nares normal, septum midline, mucosa normal, no drainage    or sinus tenderness  Throat:   Lips w/o lesion, mucosa moist, and tongue normal; teeth and gums normal  Neck:   Supple, symmetrical, trachea midline, no adenopathy;    thyroid:  no enlargement/tenderness/nodules; no carotid   bruit or JVD  Back:     Symmetric, no curvature, ROM normal, no CVA tenderness  Lungs:     Clear to auscultation bilaterally, respirations unlabored, no Wh/ R/ R  Chest Wall:    No tenderness or gross deformity; normal excursion   Heart:    Regular rate and rhythm, S1 and S2 normal, no murmur, rub   or gallop  Abdomen:     Soft, non-tender, bowel sounds active all four quadrants, No G/R/R, no masses, no organomegaly  Genitalia:    Deferred.  Rectal:    Deferred. Will collect PSA.  Extremities:   Extremities normal, atraumatic, no cyanosis or gross edema  Pulses:   2+ and symmetric all extremities  Skin:   Warm, dry, Skin color, texture, turgor normal, no obvious rashes or lesions  M-Sk:   Ambulates * 4 w/o difficulty, no gross deformities, tone WNL  Neurologic:   CNII-XII grossly intact Psych:  No HI/SI, judgement and insight good, Euthymic mood. Full Affect.

## 2021-01-09 NOTE — Patient Instructions (Signed)

## 2021-01-10 ENCOUNTER — Other Ambulatory Visit: Payer: Self-pay | Admitting: Physician Assistant

## 2021-01-10 DIAGNOSIS — E559 Vitamin D deficiency, unspecified: Secondary | ICD-10-CM

## 2021-01-10 LAB — VITAMIN D 25 HYDROXY (VIT D DEFICIENCY, FRACTURES): Vit D, 25-Hydroxy: 22.8 ng/mL — ABNORMAL LOW (ref 30.0–100.0)

## 2021-01-10 LAB — PSA: Prostate Specific Ag, Serum: 0.9 ng/mL (ref 0.0–4.0)

## 2021-01-10 MED ORDER — VITAMIN D (ERGOCALCIFEROL) 1.25 MG (50000 UNIT) PO CAPS
ORAL_CAPSULE | ORAL | 0 refills | Status: DC
Start: 2021-01-10 — End: 2022-06-05

## 2021-03-12 ENCOUNTER — Other Ambulatory Visit: Payer: Self-pay | Admitting: Physician Assistant

## 2021-03-12 DIAGNOSIS — R0683 Snoring: Secondary | ICD-10-CM

## 2021-03-12 DIAGNOSIS — R400 Somnolence: Secondary | ICD-10-CM

## 2021-04-29 ENCOUNTER — Other Ambulatory Visit: Payer: Self-pay

## 2021-04-29 ENCOUNTER — Encounter: Payer: Self-pay | Admitting: Pulmonary Disease

## 2021-04-29 ENCOUNTER — Ambulatory Visit: Payer: 59 | Admitting: Pulmonary Disease

## 2021-04-29 VITALS — BP 112/74 | HR 78 | Temp 97.8°F | Ht 71.0 in | Wt 202.0 lb

## 2021-04-29 DIAGNOSIS — G4719 Other hypersomnia: Secondary | ICD-10-CM

## 2021-04-29 NOTE — Progress Notes (Signed)
Brian Gay    EC:[id number]    August 15, 1966  Primary Care Physician:Abonza, Samantha Crimes  Referring Physician: Lorrene Reid, PA-C [place],  [place] [address]  Chief complaint:  Patient being evaluated for snoring, nonrestorative sleep, excessive daytime sleepiness   HPI:  Longstanding history of snoring Nonrestorative sleep Excessive daytime sleepiness Multiple awakenings at night  Usually goes to bed between 9 and 10 PM Falls asleep easily Wakes up up to 4-5 times during the night Final wake up time between 445 and 5 AM  Weight has remained about the same  Does have a history of hypertension  Reformed social smoker many years ago  Admits to dryness of his mouth in the morning Occasional sore throat in the mornings Rarely gets a headache Memory is good Dad snored and his son does snore as well on CPAP   Outpatient Encounter Medications as of 04/29/2021  Medication Sig   atorvastatin (LIPITOR) 80 MG tablet 1 tablet nightly before bedtime   Cholecalciferol (VITAMIN D3) 5000 units TABS 5,000 IU OTC vitamin D3 daily.   FLUoxetine (PROZAC) 20 MG capsule Take 1 capsule (20 mg total) by mouth daily.   losartan-hydrochlorothiazide (HYZAAR) 100-12.5 MG tablet Take 1 tablet by mouth daily.   Vitamin D, Ergocalciferol, (DRISDOL) 1.25 MG (50000 UNIT) CAPS capsule Take 1 capsule once weekly   [DISCONTINUED] doxycycline (VIBRA-TABS) 100 MG tablet Take 1 tablet (100 mg total) by mouth 2 (two) times daily. (Patient not taking: Reported on 04/29/2021)   No facility-administered encounter medications on file as of 04/29/2021.    Allergies as of 04/29/2021 - Review Complete 04/29/2021  Allergen Reaction Noted   Codeine Swelling 11/02/2012    Past Medical History:  Diagnosis Date   Allergy    Hypertension    Kidney stones     Past Surgical History:  Procedure Laterality Date   CLOSED REDUCTION FINGER WITH PERCUTANEOUS PINNING Right  03/19/2016   Procedure: CLOSED REDUCTION right ring  FINGER;  Surgeon: Daryll Brod, MD;  Location: [facility];  Service: Orthopedics;  Laterality: Right;   cyst removed      pilonidal cyst   WISDOM TOOTH EXTRACTION      Family History  Problem Relation Age of Onset   Heart disease Mother    Hypertension Mother    Heart disease Father    Cancer Father        prostate   Prostate cancer Father    Hyperlipidemia Father    Stroke Father    Heart disease Maternal Grandmother    Heart disease Maternal Grandfather    Heart disease Paternal Grandmother    Heart disease Paternal Grandfather    Colon cancer Neg Hx    Colon polyps Neg Hx    Rectal cancer Neg Hx    Stomach cancer Neg Hx     Social History   Socioeconomic History   Marital status: Married    Spouse name: Tammy   Number of children: 1   Years of education: 16   Highest education level: Not on file  Occupational History   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   Social Determinants of Health   Financial Resource Strain: Not on file  Food Insecurity: Not on file  Transportation Needs: Not on file  Physical Activity: Not on file  Stress: Not on file  Social Connections: Not on file  Intimate Partner Violence: Not on file    Review of Systems  Constitutional:  Positive for fatigue.  Psychiatric/Behavioral:  Positive for sleep disturbance.    Vitals:    04/29/21 1358  BP: 112/74  Pulse: 78  Temp: 97.8 F (36.6 C)  SpO2: 95%     Physical Exam Constitutional:      Appearance: Normal appearance.  HENT:     Head: Normocephalic.     Nose: No congestion.     Mouth/Throat:     Mouth: Mucous membranes are moist.     Comments: Crowded oropharynx, Mallampati 3 Cardiovascular:     Rate and Rhythm: Normal rate and regular rhythm.     Pulses: Normal pulses.     Heart sounds: Normal heart sounds. No murmur heard.   No friction rub.  Pulmonary:     Effort: No respiratory distress.     Breath sounds: No stridor. No wheezing or rhonchi.  Neurological:     General: No focal deficit present.     Mental Status: He is alert.     Cranial Nerves: No cranial nerve deficit.  Psychiatric:        Mood and Affect: Mood normal.   Epworth Sleepiness Scale of 9  Assessment:  Excessive daytime sleepiness  Nonrestorative sleep  Severe snoring  Pathophysiology of sleep disordered breathing reviewed with the patient Treatment options discussed with the patient  Plan/Recommendations: Schedule patient for home sleep study  We will update him with results as soon as reviewed  Tentative follow-up in about 3 months  Encouraged to call with any significant concerns   Sherrilyn Rist MD [facility] 04/29/2021, 2:27 PM  CC: Lorrene Reid, PA-C

## 2021-04-29 NOTE — Patient Instructions (Signed)
Moderate probability of significant obstructive sleep apnea  We will schedule you for a home sleep study Update you with results as soon as reviewed  Contact medical supply company to set you up with a CPAP  Tentative follow-up in about 3 months  Call with significant concerns  You can consider an app called snorelab -gives you a recording of your snoring at night  Sleep Apnea Sleep apnea affects breathing during sleep. It causes breathing to stop for 10 seconds or more, or to become shallow. People with sleep apnea usually snore loudly. It can also increase the risk of: Heart attack. Stroke. Being very overweight (obese). Diabetes. Heart failure. Irregular heartbeat. High blood pressure. The goal of treatment is to help you breathe normally again. What are the causes? The most common cause of this condition is a collapsed or blocked airway. There are three kinds of sleep apnea: Obstructive sleep apnea. This is caused by a blocked or collapsed airway. Central sleep apnea. This happens when the brain does not send the right signals to the muscles that control breathing. Mixed sleep apnea. This is a combination of obstructive and central sleep apnea. What increases the risk? Being overweight. Smoking. Having a small airway. Being older. Being male. Drinking alcohol. Taking medicines to calm yourself (sedatives or tranquilizers). Having family members with the condition. Having a tongue or tonsils that are larger than normal. What are the signs or symptoms? Trouble staying asleep. Loud snoring. Headaches in the morning. Waking up gasping. Dry mouth or sore throat in the morning. Being sleepy or tired during the day. If you are sleepy or tired during the day, you may also: Not be able to focus your mind (concentrate). Forget things. Get angry a lot and have mood swings. Feel sad (depressed). Have changes in your personality. Have less interest in sex, if you are  male. Be unable to have an erection, if you are male. How is this treated?  Sleeping on your side. Using a medicine to get rid of mucus in your nose (decongestant). Avoiding the use of alcohol, medicines to help you relax, or certain pain medicines (narcotics). Losing weight, if needed. Changing your diet. Quitting smoking. Using a machine to open your airway while you sleep, such as: An oral appliance. This is a mouthpiece that shifts your lower jaw forward. A CPAP device. This device blows air through a mask when you breathe out (exhale). An EPAP device. This has valves that you put in each nostril. A BPAP device. This device blows air through a mask when you breathe in (inhale) and breathe out. Having surgery if other treatments do not work. Follow these instructions at home: Lifestyle Make changes that your doctor recommends. Eat a healthy diet. Lose weight if needed. Avoid alcohol, medicines to help you relax, and some pain medicines. Do not smoke or use any products that contain nicotine or tobacco. If you need help quitting, ask your doctor. General instructions Take over-the-counter and prescription medicines only as told by your doctor. If you were given a machine to use while you sleep, use it only as told by your doctor. If you are having surgery, make sure to tell your doctor you have sleep apnea. You may need to bring your device with you. Keep all follow-up visits. Contact a doctor if: The machine that you were given to use during sleep bothers you or does not seem to be working. You do not get better. You get worse. Get help right away if:  Your chest hurts. You have trouble breathing in enough air. You have an uncomfortable feeling in your back, arms, or stomach. You have trouble talking. One side of your body feels weak. A part of your face is hanging down. These symptoms may be an emergency. Get help right away. Call your local emergency services (911 in the  [place] Do not wait to see if the symptoms will go away. Do not drive yourself to the hospital. Summary This condition affects breathing during sleep. The most common cause is a collapsed or blocked airway. The goal of treatment is to help you breathe normally while you sleep. This information is not intended to replace advice given to you by your health care provider. Make sure you discuss any questions you have with your health care provider. Document Revised: 07/12/2020 Document Reviewed: 07/12/2020 Elsevier Patient Education  2022 [employer].

## 2021-04-29 NOTE — Addendum Note (Signed)
Addended by: Dessie Coma on: 04/29/2021 02:42 PM   Modules accepted: Orders

## 2021-05-12 ENCOUNTER — Ambulatory Visit: Payer: 59 | Admitting: Physician Assistant

## 2021-05-12 ENCOUNTER — Encounter: Payer: Self-pay | Admitting: Physician Assistant

## 2021-05-12 VITALS — BP 112/73 | HR 66 | Temp 98.2°F | Ht 72.0 in | Wt 212.3 lb

## 2021-05-12 DIAGNOSIS — E559 Vitamin D deficiency, unspecified: Secondary | ICD-10-CM | POA: Diagnosis not present

## 2021-05-12 DIAGNOSIS — F4329 Adjustment disorder with other symptoms: Secondary | ICD-10-CM | POA: Diagnosis not present

## 2021-05-12 DIAGNOSIS — E785 Hyperlipidemia, unspecified: Secondary | ICD-10-CM

## 2021-05-12 DIAGNOSIS — I10 Essential (primary) hypertension: Secondary | ICD-10-CM | POA: Diagnosis not present

## 2021-05-12 DIAGNOSIS — H9201 Otalgia, right ear: Secondary | ICD-10-CM

## 2021-05-12 DIAGNOSIS — R7303 Prediabetes: Secondary | ICD-10-CM | POA: Insufficient documentation

## 2021-05-12 NOTE — Assessment & Plan Note (Signed)
-  Last A1c 5.7, discussed low carbohydrate and glucose diet. -Will repeat A1c today.

## 2021-05-12 NOTE — Assessment & Plan Note (Signed)
-  Stable. -Continue current medication regimen.  -Will continue to monitor. 

## 2021-05-12 NOTE — Assessment & Plan Note (Signed)
-  Last lipid panel: total cholesterol 193, triglycerides 101, HDL 34, LDL 140 -Will collect direct LDL. Patient reports compliance with medication. -Recommend to follow a low fat diet.

## 2021-05-12 NOTE — Assessment & Plan Note (Signed)
-  Controlled. -Continue current med regimen. -Will collect CMP for medication monitoring.

## 2021-05-12 NOTE — Assessment & Plan Note (Signed)
-  Last Vitamin D 22.8, patient was started on Vitamin D 50,000 units once a week. Will repeat Vitamin D. Pending lab results will adjust treatment plan if indicated.

## 2021-05-12 NOTE — Progress Notes (Signed)
Established Patient Office Visit  Subjective:  Patient ID: Brian Gay, male    DOB: October 03, 1965  Age: 55 y.o. MRN: [id number]  CC:  Chief Complaint  Patient presents with   Follow-up   Hypertension   Hyperlipidemia    HPI Brian Gay presents for follow up on hypertension and hyperlipidemia. Patient has c/o right ear tenderness but has not been tender lately. Denies fever or otorrhea. Does report some nasal congestion.    HTN: Pt denies chest pain, palpitations, dizziness or leg swelling. Taking medication as directed without side effects. Pt follows a low salt diet.  HLD: Pt taking medication as directed without issues. Denies side effects including myalgias and RUQ pain.   Prediabetes: Denies increased thirst or urination. Reports should decreased sodas and increase water intake.   Mood: States mood continues to be stable. Denies SI/HI or fluctuations.   Past Medical History:  Diagnosis Date   Allergy    Hypertension    Kidney stones     Past Surgical History:  Procedure Laterality Date   CLOSED REDUCTION FINGER WITH PERCUTANEOUS PINNING Right 03/19/2016   Procedure: CLOSED REDUCTION right ring  FINGER;  Surgeon: Daryll Brod, MD;  Location: [facility];  Service: Orthopedics;  Laterality: Right;   cyst removed      pilonidal cyst   WISDOM TOOTH EXTRACTION      Family History  Problem Relation Age of Onset   Heart disease Mother    Hypertension Mother    Heart disease Father    Cancer Father        prostate   Prostate cancer Father    Hyperlipidemia Father    Stroke Father    Heart disease Maternal Grandmother    Heart disease Maternal Grandfather    Heart disease Paternal Grandmother    Heart disease Paternal Grandfather    Colon cancer Neg Hx    Colon polyps Neg Hx    Rectal cancer Neg Hx    Stomach cancer Neg Hx     Social History   Socioeconomic History   Marital status: Married    Spouse name: Tammy   Number of children: 1   Years  of education: 16   Highest education level: Not on file  Occupational History   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   Social Determinants of Health   Financial Resource Strain: Not on file  Food Insecurity: Not on file  Transportation Needs: Not on file  Physical Activity: Not on file  Stress: Not on file  Social Connections: Not on file  Intimate Partner Violence: Not on file    Outpatient Medications Prior to Visit  Medication Sig Dispense Refill   atorvastatin (LIPITOR) 80 MG tablet 1 tablet nightly before bedtime 90 tablet 1   Cholecalciferol (  VITAMIN D3) 5000 units TABS 5,000 IU OTC vitamin D3 daily. 90 tablet 3   FLUoxetine (PROZAC) 20 MG capsule Take 1 capsule (20 mg total) by mouth daily. 90 capsule 1   losartan-hydrochlorothiazide (HYZAAR) 100-12.5 MG tablet Take 1 tablet by mouth daily. 90 tablet 1   Vitamin D, Ergocalciferol, (DRISDOL) 1.25 MG (50000 UNIT) CAPS capsule Take 1 capsule once weekly 24 capsule 0   No facility-administered medications prior to visit.    Allergies  Allergen Reactions   Codeine Swelling    ROS Review of Systems Review of Systems:  A fourteen system review of systems was performed and found to be positive as per HPI.   Objective:    Physical Exam General:  Well Developed, well nourished, appropriate for stated age.  Neuro:  Alert and oriented,  extra-ocular muscles intact  HEENT:   Normocephalic, atraumatic, neck supple Skin:  no gross rash, warm, pink. Cardiac:  RRR, S1 S2 Respiratory:  CTA B/L , Not using accessory muscles, speaking in full sentences- unlabored. Vascular:  Ext warm, no cyanosis apprec.; cap RF less 2 sec. Psych:  No HI/SI, judgement and insight good, Euthymic mood. Full Affect.  BP 112/73   Pulse 66   Temp 98.2 F (36.8 C)   Ht 6' (1.829 m)   Wt 212 lb 4.8 oz (96.3 kg)   SpO2 93%   BMI 28.79 kg/m  Wt Readings from Last 3 Encounters:  05/12/21 212 lb 4.8 oz (96.3 kg)  04/29/21 202 lb (91.6 kg)  01/09/21 222 lb 12.8 oz (101.1 kg)     Health Maintenance Due  Topic Date Due   COVID-19 Vaccine (3 - Pfizer risk series) 12/12/2019   INFLUENZA VACCINE  03/17/2021    There are no preventive care reminders to display for this patient.  Lab Results  Component Value Date   TSH 1.040 01/06/2021   Lab Results  Component Value Date   WBC 6.1 01/06/2021   HGB 15.5 01/06/2021   HCT 45.9 01/06/2021   MCV 91 01/06/2021   PLT 193 01/06/2021   Lab Results  Component Value Date   NA 140 01/06/2021   K 3.7 01/06/2021   CO2 21 01/06/2021   GLUCOSE 93 01/06/2021   BUN 15 01/06/2021   CREATININE 0.99 01/06/2021   BILITOT 0.6 01/06/2021   ALKPHOS 113 01/06/2021   AST 16 01/06/2021   ALT 17 01/06/2021   PROT 6.6 01/06/2021   ALBUMIN 4.3 01/06/2021   CALCIUM 8.8 01/06/2021   EGFR 90 01/06/2021   GFR 83.12 12/12/2015   Lab Results  Component Value Date   CHOL 193 01/06/2021   Lab Results  Component Value Date   HDL 34 (L) 01/06/2021   Lab Results  Component Value Date   LDLCALC 140 (H) 01/06/2021   Lab Results  Component Value Date   TRIG 101 01/06/2021   Lab Results  Component Value Date   CHOLHDL 5.7 (H) 01/06/2021   Lab Results  Component Value Date   HGBA1C 5.7 (H) 01/06/2021   Depression screen PHQ 2/9 05/12/2021 01/09/2021 09/11/2020 05/14/2020 12/21/2019  Decreased Interest 0 0 0 0 0  Down, Depressed, Hopeless 0 0 0 0  0  PHQ - 2 Score 0 0 0 0 0  Altered sleeping 0 0 0 3 0  Tired, decreased energy 0 3 0 2 3  Change in appetite 0 0 0 0 0  Feeling bad or failure about yourself  0 0 0 0 0  Trouble concentrating  0 0 0 0 0  Moving slowly or fidgety/restless 0 0 0 0 0  Suicidal thoughts 0 0 0 0 0  PHQ-9 Score 0 3 0 5 3  Difficult doing work/chores - - - Not difficult at all Not difficult at all  Some recent data might be hidden   GAD 7 : Generalized Anxiety Score 05/12/2021 01/09/2021 02/15/2019 05/12/2018  Nervous, Anxious, on Edge 0 0 0 0  Control/stop worrying 0 0 0 0  Worry too much - different things 0 0 0 0  Trouble relaxing 0 0 0 0  Restless 0 0 0 0  Easily annoyed or irritable 0 0 0 0  Afraid - awful might happen 0 0 0 0  Total GAD 7 Score 0 0 0 0  Anxiety Difficulty - - Not difficult at all Not difficult at all        Assessment & Plan:   Problem List Items Addressed This Visit       Cardiovascular and Mediastinum   Essential hypertension - Primary (Chronic)    -Controlled. -Continue current med regimen. -Will collect CMP for medication monitoring.      Relevant Orders   Comp Met (CMET)   CBC w/Diff     Other   Vitamin D deficiency (Chronic)    -Last Vitamin D 22.8, patient was started on Vitamin D 50,000 units once a week. Will repeat Vitamin D. Pending lab results will adjust treatment plan if indicated.      Relevant Orders   Vitamin D (25 hydroxy)   Stress and adjustment reaction (Chronic)    -Stable. -Continue current medication regimen. -Will continue to monitor.      Hyperlipidemia    -Last lipid panel: total cholesterol 193, triglycerides 101, HDL 34, LDL 140 -Will collect direct LDL. Patient reports compliance with medication. -Recommend to follow a low fat diet.      Relevant Orders   CBC w/Diff   Direct LDL   Prediabetes    -Last A1c 5.7, discussed low carbohydrate and glucose diet. -Will repeat A1c today.      Relevant Orders   HgB A1c   Other  Visit Diagnoses     Otalgia, right          Right otalgia: -Reassurance provided no signs of otitis media present at this time. Recommend to take oral antihistamine and/or nasal spray for nasal congestion (allergic rhinitis).  No orders of the defined types were placed in this encounter.   Follow-up: Return in about 6 months (around 11/09/2021) for HTN, HLD, Mood .    Lorrene Reid, PA-C

## 2021-05-12 NOTE — Patient Instructions (Signed)

## 2021-05-13 LAB — CBC WITH DIFFERENTIAL/PLATELET
Basophils Absolute: 0 10*3/uL (ref 0.0–0.2)
Basos: 1 %
EOS (ABSOLUTE): 0.2 10*3/uL (ref 0.0–0.4)
Eos: 4 %
Hematocrit: 48.3 % (ref 37.5–51.0)
Hemoglobin: 16.4 g/dL (ref 13.0–17.7)
Immature Grans (Abs): 0 10*3/uL (ref 0.0–0.1)
Immature Granulocytes: 1 %
Lymphocytes Absolute: 1.8 10*3/uL (ref 0.7–3.1)
Lymphs: 30 %
MCH: 31.1 pg (ref 26.6–33.0)
MCHC: 34 g/dL (ref 31.5–35.7)
MCV: 92 fL (ref 79–97)
Monocytes Absolute: 0.6 10*3/uL (ref 0.1–0.9)
Monocytes: 11 %
Neutrophils Absolute: 3.3 10*3/uL (ref 1.4–7.0)
Neutrophils: 53 %
Platelets: 199 10*3/uL (ref 150–450)
RBC: 5.28 x10E6/uL (ref 4.14–5.80)
RDW: 12.9 % (ref 11.6–15.4)
WBC: 6 10*3/uL (ref 3.4–10.8)

## 2021-05-13 LAB — COMPREHENSIVE METABOLIC PANEL
ALT: 16 IU/L (ref 0–44)
AST: 17 IU/L (ref 0–40)
Albumin/Globulin Ratio: 2 (ref 1.2–2.2)
Albumin: 4.3 g/dL (ref 3.8–4.9)
Alkaline Phosphatase: 119 IU/L (ref 44–121)
BUN/Creatinine Ratio: 14 (ref 9–20)
BUN: 13 mg/dL (ref 6–24)
Bilirubin Total: 0.2 mg/dL (ref 0.0–1.2)
CO2: 21 mmol/L (ref 20–29)
Calcium: 9.5 mg/dL (ref 8.7–10.2)
Chloride: 101 mmol/L (ref 96–106)
Creatinine, Ser: 0.93 mg/dL (ref 0.76–1.27)
Globulin, Total: 2.2 g/dL (ref 1.5–4.5)
Glucose: 86 mg/dL (ref 70–99)
Potassium: 3.9 mmol/L (ref 3.5–5.2)
Sodium: 139 mmol/L (ref 134–144)
Total Protein: 6.5 g/dL (ref 6.0–8.5)
eGFR: 97 mL/min/{1.73_m2} (ref >59)

## 2021-05-13 LAB — VITAMIN D 25 HYDROXY (VIT D DEFICIENCY, FRACTURES): Vit D, 25-Hydroxy: 30.6 ng/mL (ref 30.0–100.0)

## 2021-05-13 LAB — HEMOGLOBIN A1C
Est. average glucose Bld gHb Est-mCnc: 117 mg/dL
Hgb A1c MFr Bld: 5.7 % — ABNORMAL HIGH (ref 4.8–5.6)

## 2021-05-13 LAB — LDL CHOLESTEROL, DIRECT: LDL Direct: 148 mg/dL — ABNORMAL HIGH (ref 0–99)

## 2021-06-16 ENCOUNTER — Ambulatory Visit (INDEPENDENT_AMBULATORY_CARE_PROVIDER_SITE_OTHER): Payer: 59 | Admitting: Physician Assistant

## 2021-06-16 ENCOUNTER — Encounter: Payer: Self-pay | Admitting: Physician Assistant

## 2021-06-16 VITALS — Ht 71.0 in | Wt 210.0 lb

## 2021-06-16 DIAGNOSIS — U071 COVID-19: Secondary | ICD-10-CM | POA: Diagnosis not present

## 2021-06-16 MED ORDER — METHYLPREDNISOLONE 4 MG PO TBPK: ORAL_TABLET | ORAL | 0 refills | Status: DC

## 2021-06-16 MED ORDER — MOLNUPIRAVIR EUA 200MG CAPSULE: 4.0000 | ORAL_CAPSULE | Freq: Two times a day (BID) | ORAL | 0 refills | Status: DC

## 2021-06-16 MED ORDER — MOLNUPIRAVIR EUA 200MG CAPSULE: 4.0000 | ORAL_CAPSULE | Freq: Two times a day (BID) | ORAL | 0 refills | Status: AC

## 2021-06-16 NOTE — Progress Notes (Signed)
Telehealth office visit note for Brian Reid, PA-C- at Primary Care at [facility]   I connected with current patient today by telephone and verified that I am speaking with the correct person    Location of the patient: Home  Location of the provider: Office - This visit type was conducted due to national recommendations for restrictions regarding the COVID-19 Pandemic (e.g. social distancing) in an effort to limit this patient's exposure and mitigate transmission in our community.    - No physical exam could be performed with this format, beyond that communicated to [place] by the patient/ family members as noted.   - Additionally my office staff/ schedulers were to discuss with the patient that there may be a monetary charge related to this service, depending on their medical insurance.  My understanding is that patient understood and consented to proceed.     _________________________________________________________________________________   History of Present Illness: Patient calls in with Covid-19 infection. Reports symptoms started last Thursday with body aches and did an at-home Covid test which was negative. Reports Friday felt worse, did not go to work and retested for Covid which was positive. Symptoms include fatigue, headache, sinus pressure, mild cough, low grade fever (100.0) and intermittent diarrhea. Denies n/v, chest pain, shortness of breath or altered mental status. Completed Pfzier Covid vaccine.      GAD 7 : Generalized Anxiety Score 06/16/2021 05/12/2021 01/09/2021 02/15/2019  Nervous, Anxious, on Edge 0 0 0 0  Control/stop worrying 0 0 0 0  Worry too much - different things 0 0 0 0  Trouble relaxing 0 0 0 0  Restless 0 0 0 0  Easily annoyed or irritable 0 0 0 0  Afraid - awful might happen 0 0 0 0  Total GAD 7 Score 0 0 0 0  Anxiety Difficulty Not difficult at all - - Not difficult at all    Depression screen [facility] 2/9 06/16/2021 05/12/2021 01/09/2021 09/11/2020  05/14/2020  Decreased Interest 0 0 0 0 0  Down, Depressed, Hopeless 0 0 0 0 0  PHQ - 2 Score 0 0 0 0 0  Altered sleeping 0 0 0 0 3  Tired, decreased energy 0 0 3 0 2  Change in appetite 0 0 0 0 0  Feeling bad or failure about yourself  0 0 0 0 0  Trouble concentrating 0 0 0 0 0  Moving slowly or fidgety/restless 0 0 0 0 0  Suicidal thoughts 0 0 0 0 0  PHQ-9 Score 0 0 3 0 5  Difficult doing work/chores Not difficult at all - - - Not difficult at all  Some recent data might be hidden      Impression and Recommendations:     1. COVID-19 virus infection    -Patient has 3 [id number] risk of complications and is within 5-day window of symptom onset so will start oral anti-viral medication therapy with molnupiravir and corticosteroid taper. Discussed with patient potential side effects. Recommend to follow latest CDC isolation guidelines. Continue home supportive including antipyretic, rest, cough lozenges and rest. Monitor for worsening symptoms.     - As part of my medical decision making, I reviewed the following data within the [place] History obtained from pt /family, CMA notes reviewed and incorporated if applicable, Labs reviewed, Radiograph/ tests reviewed if applicable and OV notes from prior OV's with me, as well as any other specialists she/he has seen since seeing me last, were  all reviewed and used in my medical decision making process today.    - Additionally, when appropriate, discussion had with patient regarding our treatment plan, and their biases/concerns about that plan were used in my medical decision making today.    - The patient agreed with the plan and demonstrated an understanding of the instructions.   No barriers to understanding were identified.     - The patient was advised to call back or seek an in-person evaluation if the symptoms worsen or if the condition fails to improve as anticipated.   Return if symptoms worsen or fail to improve.     No orders of the defined types were placed in this encounter.   Meds ordered this encounter  Medications   DISCONTD: molnupiravir EUA (LAGEVRIO) 200 mg CAPS capsule    Sig: Take 4 capsules (800 mg total) by mouth 2 (two) times daily for 5 days.    Dispense:  40 capsule    Refill:  0    Order Specific Question:   Supervising Provider    Answer:   Beatrice Lecher D [[id number]]   DISCONTD: methylPREDNISolone (MEDROL DOSEPAK) 4 MG TBPK tablet    Sig: Take as directed on package.    Dispense:  21 tablet    Refill:  0    Order Specific Question:   Supervising Provider    Answer:   Beatrice Lecher D [[id number]]   molnupiravir EUA (LAGEVRIO) 200 mg CAPS capsule    Sig: Take 4 capsules (800 mg total) by mouth 2 (two) times daily for 5 days.    Dispense:  40 capsule    Refill:  0   methylPREDNISolone (MEDROL DOSEPAK) 4 MG TBPK tablet    Sig: Take as directed on package.    Dispense:  21 tablet    Refill:  0     Medications Discontinued During This Encounter  Medication Reason   molnupiravir EUA (LAGEVRIO) 200 mg CAPS capsule    methylPREDNISolone (MEDROL DOSEPAK) 4 MG TBPK tablet        Time spent on telephone encounter was 7 minutes.      The [place] was signed into law in 2016 which includes the topic of electronic health records.  This provides immediate access to information in MyChart.  This includes consultation notes, operative notes, office notes, lab results and pathology reports.  If you have any questions about what you read please let us know at your next visit or call us at the office.  We are right here with you.   __________________________________________________________________________________     Patient Care Team    Relationship Specialty Notifications Start End  Brian Gay, [place] PCP - General   12/17/19   Alicia Amel, MD Consulting Physician Dentistry  12/11/15   Daryll Brod, MD Consulting Physician Orthopedic Surgery  01/26/17       -Vitals obtained; medications/ allergies reconciled;  personal medical, social, Sx etc.histories were updated by CMA, reviewed by me and are reflected in chart   Patient Active Problem List   Diagnosis Date Noted   Prediabetes 05/12/2021   Flu-like symptoms 10/10/2018   Obesity, Class I, BMI 30-34.9 09/19/2018   Hyperlipidemia 05/12/2018   Acute bronchitis 11/25/2017   Acute maxillary sinusitis 11/25/2017   Wheezing 11/25/2017   Sinusitis 06/02/2017   Stress and adjustment reaction 06/02/2017   Low HDL (under 40) 01/26/2017   Vitamin D deficiency 09/08/2016   Tobacco abuse counseling 09/05/2016   Elevated LDL cholesterol level 08/29/2016  h/o Tobacco use disorder 08/04/2016   Obese 08/04/2016   Environmental and seasonal allergies 08/04/2016   Fatigue- esp AM 08/04/2016   Closed displaced fracture of middle phalanx of right ring finger 03/16/2016   Volar plate injury of finger 03/16/2016   Wellness examination 12/12/2015   Essential hypertension 10/10/2015   Allergic rhinitis 11/02/2012     Current Meds  Medication Sig   atorvastatin (LIPITOR) 80 MG tablet 1 tablet nightly before bedtime   Cholecalciferol (VITAMIN D3) 5000 units TABS 5,000 IU OTC vitamin D3 daily.   FLUoxetine (PROZAC) 20 MG capsule Take 1 capsule (20 mg total) by mouth daily.   losartan-hydrochlorothiazide (HYZAAR) 100-12.5 MG tablet Take 1 tablet by mouth daily.   Vitamin D, Ergocalciferol, (DRISDOL) 1.25 MG (50000 UNIT) CAPS capsule Take 1 capsule once weekly   [DISCONTINUED] methylPREDNISolone (MEDROL DOSEPAK) 4 MG TBPK tablet Take as directed on package.   [DISCONTINUED] molnupiravir EUA (LAGEVRIO) 200 mg CAPS capsule Take 4 capsules (800 mg total) by mouth 2 (two) times daily for 5 days.     Allergies:  Allergies  Allergen Reactions   Codeine Swelling     ROS:  See above HPI for pertinent positives and negatives   Objective:   Height 5\' 11"  (1.803 m), weight 210 lb (95.3 kg).  (if  some vitals are omitted, this means that patient was UNABLE to obtain them.) General: A & O * 3; sounds in no acute distress Respiratory: speaking in full sentences, no conversational dyspnea Psych: insight appears good, mood- appears full

## 2021-06-16 NOTE — Patient Instructions (Signed)
COVID-19: Quarantine and Isolation Quarantine If you were exposed Quarantine and stay away from others when you have been in close contact with someone who has COVID-19. Isolate If you are sick or test positive Isolate when you are sick or when you have COVID-19, even if you don't have symptoms. When to stay home Calculating quarantine The date of your exposure is considered day 0. Day 1 is the first full day after your last contact with a person who has had COVID-19. Stay home and away from other people for at least 5 days. Learn why CDC updated guidance for the general public. IF YOU were exposed to COVID-19 and are NOT  up to dateIF YOU were exposed to COVID-19 and are NOT on COVID-19 vaccinations Quarantine for at least 5 days Stay home Stay home and quarantine for at least 5 full days. Wear a well-fitting mask if you must be around others in your home. Do not travel. Get tested Even if you don't develop symptoms, get tested at least 5 days after you last had close contact with someone with COVID-19. After quarantine Watch for symptoms Watch for symptoms until 10 days after you last had close contact with someone with COVID-19. Avoid travel It is best to avoid travel until a full 10 days after you last had close contact with someone with COVID-19. If you develop symptoms Isolate immediately and get tested. Continue to stay home until you know the results. Wear a well-fitting mask around others. Take precautions until day 10 Wear a well-fitting mask Wear a well-fitting mask for 10 full days any time you are around others inside your home or in public. Do not go to places where you are unable to wear a well-fitting mask. If you must travel during days 6-10, take precautions. Avoid being around people who are more likely to get very sick from COVID-19. IF YOU were exposed to COVID-19 and are  up to dateIF YOU were exposed to COVID-19 and are on COVID-19 vaccinations No  quarantine You do not need to stay home unless you develop symptoms. Get tested Even if you don't develop symptoms, get tested at least 5 days after you last had close contact with someone with COVID-19. Watch for symptoms Watch for symptoms until 10 days after you last had close contact with someone with COVID-19. If you develop symptoms Isolate immediately and get tested. Continue to stay home until you know the results. Wear a well-fitting mask around others. Take precautions until day 10 Wear a well-fitting mask Wear a well-fitting mask for 10 full days any time you are around others inside your home or in public. Do not go to places where you are unable to wear a well-fitting mask. Take precautions if traveling Avoid being around people who are more likely to get very sick from COVID-19. IF YOU were exposed to COVID-19 and had confirmed COVID-19 within the past 90 days (you tested positive using a viral test) No quarantine You do not need to stay home unless you develop symptoms. Watch for symptoms Watch for symptoms until 10 days after you last had close contact with someone with COVID-19. If you develop symptoms Isolate immediately and get tested. Continue to stay home until you know the results. Wear a well-fitting mask around others. Take precautions until day 10 Wear a well-fitting mask Wear a well-fitting mask for 10 full days any time you are around others inside your home or in public. Do not go to places where you are  unable to wear a well-fitting mask. Take precautions if traveling Avoid being around people who are more likely to get very sick from COVID-19. Calculating isolation Day 0 is your first day of symptoms or a positive viral test. Day 1 is the first full day after your symptoms developed or your test specimen was collected. If you have COVID-19 or have symptoms, isolate for at least 5 days. IF YOU tested positive for COVID-19 or have symptoms, regardless of  vaccination status Stay home for at least 5 days Stay home for 5 days and isolate from others in your home. Wear a well-fitting mask if you must be around others in your home. Do not travel. Ending isolation if you had symptoms End isolation after 5 full days if you are fever-free for 24 hours (without the use of fever-reducing medication) and your symptoms are improving. Ending isolation if you did NOT have symptoms End isolation after at least 5 full days after your positive test. If you got very sick from COVID-19 or have a weakened immune system You should isolate for at least 10 days. Consult your doctor before ending isolation. Take precautions until day 10 Wear a well-fitting mask Wear a well-fitting mask for 10 full days any time you are around others inside your home or in public. Do not go to places where you are unable to wear a well-fitting mask. Do not travel Do not travel until a full 10 days after your symptoms started or the date your positive test was taken if you had no symptoms. Avoid being around people who are more likely to get very sick from COVID-19. Definitions Exposure Contact with someone infected with SARS-CoV-2, the virus that causes COVID-19, in a way that increases the likelihood of getting infected with the virus. Close contact A close contact is someone who was less than 6 feet away from an infected person (laboratory-confirmed or a clinical diagnosis) for a cumulative total of 15 minutes or more over a 24-hour period. For example, three individual 5-minute exposures for a total of 15 minutes. People who are exposed to someone with COVID-19 after they completed at least 5 days of isolation are not considered close contacts. [place] Gay is a strategy used to prevent transmission of COVID-19 by keeping people who have been in close contact with someone with COVID-19 apart from others. Who does not need to quarantine? If you had close contact with  someone with COVID-19 and you are in one of the following groups, you do not need to quarantine. You are up to date with your COVID-19 vaccines. You had confirmed COVID-19 within the last 90 days (meaning you tested positive using a viral test). If you are up to date with COVID-19 vaccines, you should wear a well-fitting mask around others for 10 days from the date of your last close contact with someone with COVID-19 (the date of last close contact is considered day 0). Get tested at least 5 days after you last had close contact with someone with COVID-19. If you test positive or develop COVID-19 symptoms, isolate from other people and follow recommendations in the Isolation section below. If you tested positive for COVID-19 with a viral test within the previous 90 days and subsequently recovered and remain without COVID-19 symptoms, you do not need to quarantine or get tested after close contact. You should wear a well-fitting mask around others for 10 days from the date of your last close contact with someone with COVID-19 (the date of last  close contact is considered day 0). If you have COVID-19 symptoms, get tested and isolate from other people and follow recommendations in the Isolation section below. Who should quarantine? If you come into close contact with someone with COVID-19, you should quarantine if you are not up to date on COVID-19 vaccines. This includes people who are not vaccinated. What to do for quarantine Stay home and away from other people for at least 5 days (day 0 through day 5) after your last contact with a person who has COVID-19. The date of your exposure is considered day 0. Wear a well-fitting mask when around others at home, if possible. For 10 days after your last close contact with someone with COVID-19, watch for fever (100.38F or greater), cough, shortness of breath, or other COVID-19 symptoms. If you develop symptoms, get tested immediately and isolate until you receive  your test results. If you test positive, follow isolation recommendations. If you do not develop symptoms, get tested at least 5 days after you last had close contact with someone with COVID-19. If you test negative, you can leave your home, but continue to wear a well-fitting mask when around others at home and in public until 10 days after your last close contact with someone with COVID-19. If you test positive, you should isolate for at least 5 days from the date of your positive test (if you do not have symptoms). If you do develop COVID-19 symptoms, isolate for at least 5 days from the date your symptoms began (the date the symptoms started is day 0). Follow recommendations in the isolation section below. If you are unable to get a test 5 days after last close contact with someone with COVID-19, you can leave your home after day 5 if you have been without COVID-19 symptoms throughout the 5-day period. Wear a well-fitting mask for 10 days after your date of last close contact when around others at home and in public. Avoid people who are have weakened immune systems or are more likely to get very sick from COVID-19, and nursing homes and other high-risk settings, until after at least 10 days. If possible, stay away from people you live with, especially people who are at higher risk for getting very sick from COVID-19, as well as others outside your home throughout the full 10 days after your last close contact with someone with COVID-19. If you are unable to quarantine, you should wear a well-fitting mask for 10 days when around others at home and in public. If you are unable to wear a mask when around others, you should continue to quarantine for 10 days. Avoid people who have weakened immune systems or are more likely to get very sick from COVID-19, and nursing homes and other high-risk settings, until after at least 10 days. See additional information about travel. Do not go to places where you are  unable to wear a mask, such as restaurants and some gyms, and avoid eating around others at home and at work until after 10 days after your last close contact with someone with COVID-19. After quarantine Watch for symptoms until 10 days after your last close contact with someone with COVID-19. If you have symptoms, isolate immediately and get tested. Quarantine in high-risk congregate settings In certain congregate settings that have high risk of secondary transmission (such as Systems analyst and detention facilities, homeless shelters, or cruise ships), CDC recommends a 10-day quarantine for residents, regardless of vaccination and booster status. During periods of critical staffing  shortages, facilities may consider shortening the quarantine period for staff to ensure continuity of operations. Decisions to shorten quarantine in these settings should be made in consultation with state, local, tribal, or territorial health departments and should take into consideration the context and characteristics of the facility. CDC's setting-specific guidance provides additional recommendations for these settings. Isolation Isolation is used to separate people with confirmed or suspected COVID-19 from those without COVID-19. People who are in isolation should stay home until it's safe for them to be around others. At home, anyone sick or infected should separate from others, or wear a well-fitting mask when they need to be around others. People in isolation should stay in a specific "sick room" or area and use a separate bathroom if available. Everyone who has presumed or confirmed COVID-19 should stay home and isolate from other people for at least 5 full days (day 0 is the first day of symptoms or the date of the day of the positive viral test for asymptomatic persons). They should wear a mask when around others at home and in public for an additional 5 days. People who are confirmed to have COVID-19 or are showing  symptoms of COVID-19 need to isolate regardless of their vaccination status. This includes: People who have a positive viral test for COVID-19, regardless of whether or not they have symptoms. People with symptoms of COVID-19, including people who are awaiting test results or have not been tested. People with symptoms should isolate even if they do not know if they have been in close contact with someone with COVID-19. What to do for isolation Monitor your symptoms. If you have an emergency warning sign (including trouble breathing), seek emergency medical care immediately. Stay in a separate room from other household members, if possible. Use a separate bathroom, if possible. Take steps to improve ventilation at home, if possible. Avoid contact with other members of the household and pets. Don't share personal household items, like cups, towels, and utensils. Wear a well-fitting mask when you need to be around other people. Learn more about what to do if you are sick and how to notify your contacts. Ending isolation for people who had COVID-19 and had symptoms If you had COVID-19 and had symptoms, isolate for at least 5 days. To calculate your 5-day isolation period, day 0 is your first day of symptoms. Day 1 is the first full day after your symptoms developed. You can leave isolation after 5 full days. You can end isolation after 5 full days if you are fever-free for 24 hours without the use of fever-reducing medication and your other symptoms have improved (Loss of taste and smell may persist for weeks or months after recovery and need not delay the end of isolation). You should continue to wear a well-fitting mask around others at home and in public for 5 additional days (day 6 through day 10) after the end of your 5-day isolation period. If you are unable to wear a mask when around others, you should continue to isolate for a full 10 days. Avoid people who have weakened immune systems or are more  likely to get very sick from COVID-19, and nursing homes and other high-risk settings, until after at least 10 days. If you continue to have fever or your other symptoms have not improved after 5 days of isolation, you should wait to end your isolation until you are fever-free for 24 hours without the use of fever-reducing medication and your other symptoms have improved.  Continue to wear a well-fitting mask through day 10. Contact your healthcare provider if you have questions. See additional information about travel. Do not go to places where you are unable to wear a mask, such as restaurants and some gyms, and avoid eating around others at home and at work until a full 10 days after your first day of symptoms. If an individual has access to a test and wants to test, the best approach is to use an antigen test1 towards the end of the 5-day isolation period. Collect the test sample only if you are fever-free for 24 hours without the use of fever-reducing medication and your other symptoms have improved (loss of taste and smell may persist for weeks or months after recovery and need not delay the end of isolation). If your test result is positive, you should continue to isolate until day 10. If your test result is negative, you can end isolation, but continue to wear a well-fitting mask around others at home and in public until day 10. Follow additional recommendations for masking and avoiding travel as described above. 1As noted in the labeling for authorized over-the counter antigen tests: Negative results should be treated as presumptive. Negative results do not rule out SARS-CoV-2 infection and should not be used as the sole basis for treatment or patient management decisions, including infection control decisions. To improve results, antigen tests should be used twice over a three-day period with at least 24 hours and no more than 48 hours between tests. Note that these recommendations on ending isolation  do not apply to people who are moderately ill or very sick from COVID-19 or have weakened immune systems. See section below for recommendations for when to end isolation for these groups. Ending isolation for people who tested positive for COVID-19 but had no symptoms If you test positive for COVID-19 and never develop symptoms, isolate for at least 5 days. Day 0 is the day of your positive viral test (based on the date you were tested) and day 1 is the first full day after the specimen was collected for your positive test. You can leave isolation after 5 full days. If you continue to have no symptoms, you can end isolation after at least 5 days. You should continue to wear a well-fitting mask around others at home and in public until day 10 (day 6 through day 10). If you are unable to wear a mask when around others, you should continue to isolate for 10 days. Avoid people who have weakened immune systems or are more likely to get very sick from COVID-19, and nursing homes and other high-risk settings, until after at least 10 days. If you develop symptoms after testing positive, your 5-day isolation period should start over. Day 0 is your first day of symptoms. Follow the recommendations above for ending isolation for people who had COVID-19 and had symptoms. See additional information about travel. Do not go to places where you are unable to wear a mask, such as restaurants and some gyms, and avoid eating around others at home and at work until 10 days after the day of your positive test. If an individual has access to a test and wants to test, the best approach is to use an antigen test1 towards the end of the 5-day isolation period. If your test result is positive, you should continue to isolate until day 10. If your test result is positive, you can also choose to test daily and if your test result  is negative, you can end isolation, but continue to wear a well-fitting mask around others at home and in  public until day 10. Follow additional recommendations for masking and avoiding travel as described above. 1As noted in the labeling for authorized over-the counter antigen tests: Negative results should be treated as presumptive. Negative results do not rule out SARS-CoV-2 infection and should not be used as the sole basis for treatment or patient management decisions, including infection control decisions. To improve results, antigen tests should be used twice over a three-day period with at least 24 hours and no more than 48 hours between tests. Ending isolation for people who were moderately or very sick from COVID-19 or have a weakened immune system People who are moderately ill from COVID-19 (experiencing symptoms that affect the lungs like shortness of breath or difficulty breathing) should isolate for 10 days and follow all other isolation precautions. To calculate your 10-day isolation period, day 0 is your first day of symptoms. Day 1 is the first full day after your symptoms developed. If you are unsure if your symptoms are moderate, talk to a healthcare provider for further guidance. People who are very sick from COVID-19 (this means people who were hospitalized or required intensive care or ventilation support) and people who have weakened immune systems might need to isolate at home longer. They may also require testing with a viral test to determine when they can be around others. CDC recommends an isolation period of at least 10 and up to 20 days for people who were very sick from COVID-19 and for people with weakened immune systems. Consult with your healthcare provider about when you can resume being around other people. If you are unsure if your symptoms are severe or if you have a weakened immune system, talk to a healthcare provider for further guidance. People who have a weakened immune system should talk to their healthcare provider about the potential for reduced immune responses to  COVID-19 vaccines and the need to continue to follow current prevention measures (including wearing a well-fitting mask and avoiding crowds and poorly ventilated indoor spaces) to protect themselves against COVID-19 until advised otherwise by their healthcare provider. Close contacts of immunocompromised people--including household members--should also be encouraged to receive all recommended COVID-19 vaccine doses to help protect these people. Isolation in high-risk congregate settings In certain high-risk congregate settings that have high risk of secondary transmission and where it is not feasible to cohort people (such as Systems analyst and detention facilities, homeless shelters, and cruise ships), CDC recommends a 10-day isolation period for residents. During periods of critical staffing shortages, facilities may consider shortening the isolation period for staff to ensure continuity of operations. Decisions to shorten isolation in these settings should be made in consultation with state, local, tribal, or territorial health departments and should take into consideration the context and characteristics of the facility. CDC's setting-specific guidance provides additional recommendations for these settings. This CDC guidance is meant to supplement--not replace--any federal, state, local, territorial, or tribal health and safety laws, rules, and regulations. Recommendations for specific settings These recommendations do not apply to healthcare professionals. For guidance specific to these settings, see Healthcare professionals: Interim Guidance for Optician, dispensing with SARS-CoV-2 Infection or Exposure to SARS-CoV-2 Patients, residents, and visitors to healthcare settings: Interim Infection Prevention and Control Recommendations for Healthcare Personnel During the [place] 2019 (COVID-19) Pandemic Additional setting-specific guidance and recommendations are available. These  recommendations on quarantine and isolation do apply to [place]  settings. Additional guidance is available here: Overview of COVID-19 Quarantine for K-12 Schools Travelers: Travel information and recommendations Congregate facilities and other settings: [employer] for community, work, and school settings Ongoing COVID-19 exposure FAQs I live with someone with COVID-19, but I cannot be separated from them. How do we manage quarantine in this situation? It is very important for people with COVID-19 to remain apart from other people, if possible, even if they are living together. If separation of the person with COVID-19 from others that they live with is not possible, the other people that they live with will have ongoing exposure, meaning they will be repeatedly exposed until that person is no longer able to spread the virus to other people. In this situation, there are precautions you can take to limit the spread of COVID-19: The person with COVID-19 and everyone they live with should wear a well-fitting mask inside the home. If possible, one person should care for the person with COVID-19 to limit the number of people who are in close contact with the infected person. Take steps to protect yourself and others to reduce transmission in the home: Quarantine if you are not up to date with your COVID-19 vaccines. Isolate if you are sick or tested positive for COVID-19, even if you don't have symptoms. Learn more about the public health recommendations for testing, mask use and quarantine of close contacts, like yourself, who have ongoing exposure. These recommendations differ depending on your vaccination status. What should I do if I have ongoing exposure to COVID-19 from someone I live with? Recommendations for this situation depend on your vaccination status: If you are not up to date on COVID-19 vaccines and have ongoing exposure to COVID-19, you should: Begin quarantine immediately and  continue to quarantine throughout the isolation period of the person with COVID-19. Continue to quarantine for an additional 5 days starting the day after the end of isolation for the person with COVID-19. Get tested at least 5 days after the end of isolation of the infected person that lives with them. If you test negative, you can leave the home but should continue to wear a well-fitting mask when around others at home and in public until 10 days after the end of isolation for the person with COVID-19. Isolate immediately if you develop symptoms of COVID-19 or test positive. If you are up to date with COVID-19 vaccines and have ongoing exposure to COVID-19, you should: Get tested at least 5 days after your first exposure. A person with COVID-19 is considered infectious starting 2 days before they develop symptoms, or 2 days before the date of their positive test if they do not have symptoms. Get tested again at least 5 days after the end of isolation for the person with COVID-19. Wear a well-fitting mask when you are around the person with COVID-19, and do this throughout their isolation period. Wear a well-fitting mask around others for 10 days after the infected person's isolation period ends. Isolate immediately if you develop symptoms of COVID-19 or test positive. What should I do if multiple people I live with test positive for COVID-19 at different times? Recommendations for this situation depend on your vaccination status: If you are not up to date with your COVID-19 vaccines, you should: Quarantine throughout the isolation period of any infected person that you live with. Continue to quarantine until 5 days after the end of isolation date for the most recently infected person that lives with you. For example, if  the last day of isolation of the person most recently infected with COVID-19 was June 30, the new 5-day quarantine period starts on July 1. Get tested at least 5 days after the end  of isolation for the most recently infected person that lives with you. Wear a well-fitting mask when you are around any person with COVID-19 while that person is in isolation. Wear a well-fitting mask when you are around other people until 10 days after your last close contact. Isolate immediately if you develop symptoms of COVID-19 or test positive. If you are up to date with your COVID-19 vaccines, you should: Get tested at least 5 days after your first exposure. A person with COVID-19 is considered infectious starting 2 days before they developed symptoms, or 2 days before the date of their positive test if they do not have symptoms. Get tested again at least 5 days after the end of isolation for the most recently infected person that lives with you. Wear a well-fitting mask when you are around any person with COVID-19 while that person is in isolation. Wear a well-fitting mask around others for 10 days after the end of isolation for the most recently infected person that lives with you. For example, if the last day of isolation for the person most recently infected with COVID-19 was June 30, the new 10-day period to wear a well-fitting mask indoors in public starts on July 1. Isolate immediately if you develop symptoms of COVID-19 or test positive. I had COVID-19 and completed isolation. Do I have to quarantine or get tested if someone I live with gets COVID-19 shortly after I completed isolation? No. If you recently completed isolation and someone that lives with you tests positive for the virus that causes COVID-19 shortly after the end of your isolation period, you do not have to quarantine or get tested as long as you do not develop new symptoms. Once all of the people that live together have completed isolation or quarantine, refer to the guidance below for new exposures to COVID-19. If you had COVID-19 in the previous 90 days and then came into close contact with someone with COVID-19, you do  not have to quarantine or get tested if you do not have symptoms. But you should: Wear a well-fitting mask indoors in public for 10 days after your last close contact. Monitor for COVID-19 symptoms for 10 days from the date of your last close contact. Isolate immediately and get tested if symptoms develop. If more than 90 days have passed since your recovery from infection, follow CDC's recommendations for close contacts. These recommendations will differ depending on your vaccination status. 11/13/2020 Content source: [facility] ([facility]), Division of Viral Diseases This information is not intended to replace advice given to you by your health care provider. Make sure you discuss any questions you have with your health care provider. Document Revised: 12/19/2020 Document Reviewed: 12/19/2020 Elsevier Patient Education  [place].

## 2021-07-08 ENCOUNTER — Other Ambulatory Visit: Payer: Self-pay

## 2021-07-08 ENCOUNTER — Ambulatory Visit: Payer: 59

## 2021-07-08 DIAGNOSIS — G4719 Other hypersomnia: Secondary | ICD-10-CM

## 2021-07-08 DIAGNOSIS — G4733 Obstructive sleep apnea (adult) (pediatric): Secondary | ICD-10-CM

## 2021-07-26 ENCOUNTER — Telehealth: Payer: Self-pay | Admitting: Pulmonary Disease

## 2021-07-26 DIAGNOSIS — G4719 Other hypersomnia: Secondary | ICD-10-CM

## 2021-07-26 DIAGNOSIS — G4733 Obstructive sleep apnea (adult) (pediatric): Secondary | ICD-10-CM

## 2021-07-26 NOTE — Telephone Encounter (Signed)
Call patient  Sleep study result  Date of study: 07/08/2021  Impression: Severe obstructive sleep apnea Moderate oxygen desaturations  Recommendation:  DME referral  Recommend CPAP therapy for severe obstructive sleep apnea  Auto titrating CPAP with pressure settings of 5-20 will be appropriate  Encourage weight loss measures  Follow-up in the office 4 to 6 weeks following initiation of treatment

## 2021-07-28 NOTE — Telephone Encounter (Signed)
I called and spoke with the patient and he voice understanding. He is agreeable to the CPAP and I have placed the order. Nothing further needed.

## 2021-08-08 ENCOUNTER — Other Ambulatory Visit: Payer: Self-pay | Admitting: Physician Assistant

## 2021-08-08 DIAGNOSIS — I10 Essential (primary) hypertension: Secondary | ICD-10-CM

## 2021-08-19 ENCOUNTER — Other Ambulatory Visit: Payer: Self-pay | Admitting: Physician Assistant

## 2021-08-19 DIAGNOSIS — F4329 Adjustment disorder with other symptoms: Secondary | ICD-10-CM

## 2021-08-26 ENCOUNTER — Encounter: Payer: Self-pay | Admitting: Physician Assistant

## 2021-08-26 ENCOUNTER — Ambulatory Visit: Payer: 59 | Admitting: Physician Assistant

## 2021-08-26 ENCOUNTER — Other Ambulatory Visit: Payer: Self-pay

## 2021-08-26 VITALS — BP 99/64 | HR 89 | Temp 98.0°F | Ht 72.0 in | Wt 220.2 lb

## 2021-08-26 DIAGNOSIS — J019 Acute sinusitis, unspecified: Secondary | ICD-10-CM

## 2021-08-26 MED ORDER — AMOXICILLIN-POT CLAVULANATE 875-125 MG PO TABS: 1.0000 | ORAL_TABLET | Freq: Two times a day (BID) | ORAL | 0 refills | Status: DC

## 2021-08-26 MED ORDER — FLUTICASONE PROPIONATE 50 MCG/ACT NA SUSP: 2.0000 | Freq: Every day | NASAL | 1 refills | Status: DC

## 2021-08-26 NOTE — Progress Notes (Signed)
Acute Office Visit  Subjective:    Patient ID: Brian Gay, male    DOB: Nov 23, 1965, 56 y.o.   MRN: [id number]  No chief complaint on file.   HPI Patient is in today for c/o sinus pressure, nasal congestion, runny nose, dizziness, and ear fullness of both ears. No fever, cough, chills, sore throat. Patient reports these symptoms have been lingering since he had Covid in October 2022.  Past Medical History:  Diagnosis Date   Allergy    Hypertension    Kidney stones     Past Surgical History:  Procedure Laterality Date   CLOSED REDUCTION FINGER WITH PERCUTANEOUS PINNING Right 03/19/2016   Procedure: CLOSED REDUCTION right ring  FINGER;  Surgeon: Daryll Brod, MD;  Location: [place];  Service: Orthopedics;  Laterality: Right;   cyst removed      pilonidal cyst   WISDOM TOOTH EXTRACTION      Family History  Problem Relation Age of Onset   Heart disease Mother    Hypertension Mother    Heart disease Father    Cancer Father        prostate   Prostate cancer Father    Hyperlipidemia Father    Stroke Father    Heart disease Maternal Grandmother    Heart disease Maternal Grandfather    Heart disease Paternal Grandmother    Heart disease Paternal Grandfather    Colon cancer Neg Hx    Colon polyps Neg Hx    Rectal cancer Neg Hx    Stomach cancer Neg Hx     Social History   Socioeconomic History   Marital status: Married    Spouse name: Tammy   Number of children: 1   Years of education: 16   Highest education level: Not on file  Occupational History   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   Social Determinants of Health   Financial Resource Strain: Not on file  Food Insecurity: Not on file  Transportation Needs: Not on file  Physical Activity: Not on file  Stress: Not on file  Social Connections: Not on file  Intimate Partner Violence: Not on file    Outpatient Medications Prior to Visit  Medication Sig Dispense Refill   atorvastatin (LIPITOR) 80 MG tablet 1 tablet nightly before bedtime 90 tablet 1   Cholecalciferol (VITAMIN D3) 5000 units TABS 5,000 IU OTC vitamin D3 daily. 90 tablet 3   FLUoxetine (PROZAC) 20 MG capsule TAKE 1 CAPSULE BY MOUTH DAILY. 30 capsule 1   losartan-hydrochlorothiazide (HYZAAR) 100-12.5 MG tablet TAKE 1 TABLET BY MOUTH DAILY. 90 tablet 1   methylPREDNISolone (MEDROL DOSEPAK) 4 MG TBPK tablet Take as directed on package. 21 tablet 0   Vitamin D, Ergocalciferol, (DRISDOL) 1.25 MG (50000 UNIT) CAPS capsule Take 1  capsule once weekly 24 capsule 0   No facility-administered medications prior to visit.    Allergies  Allergen Reactions   Codeine Swelling    Review of Systems Review of Systems:  A fourteen system review of systems was performed and found to be positive as per HPI.    Objective:    Physical Exam General:  Well Developed, well nourished, appropriate for stated age.  Neuro:  Alert and oriented,  extra-ocular muscles intact  HEENT:  Normocephalic, atraumatic, mild sinus tenderness, PERRL, erythema and serous fluid of right TM, serous fluid of left TM, clear nasal drainage with boggy turbinates, mild erythema of posterior of oropharyhx, neck supple, no adenopathy  Skin:  no gross rash, warm, pink. Cardiac:  RRR, S1 S2 Respiratory: CTA B/L  Vascular:  Ext warm, no cyanosis apprec.; cap RF less 2 sec. Psych:  No HI/SI, judgement and insight  good, Euthymic mood. Full Affect.  BP 99/64    Pulse 89    Temp 98 F (36.7 C)    Ht 6' (1.829 m)    Wt 220 lb 3.2 oz (99.9 kg)    SpO2 97%    BMI 29.86 kg/m  Wt Readings from Last 3 Encounters:  08/26/21 220 lb 3.2 oz (99.9 kg)  06/16/21 210 lb (95.3 kg)  05/12/21 212 lb 4.8 oz (96.3 kg)    Health Maintenance Due  Topic Date Due   COVID-19 Vaccine (3 - Booster for Pfizer series) 01/09/2020   INFLUENZA VACCINE  03/17/2021    There are no preventive care reminders to display for this patient.   Lab Results  Component Value Date   TSH 1.040 01/06/2021   Lab Results  Component Value Date   WBC 6.0 05/12/2021   HGB 16.4 05/12/2021   HCT 48.3 05/12/2021   MCV 92 05/12/2021   PLT 199 05/12/2021   Lab Results  Component Value Date   NA 139 05/12/2021   K 3.9 05/12/2021   CO2 21 05/12/2021   GLUCOSE 86 05/12/2021   BUN 13 05/12/2021   CREATININE 0.93 05/12/2021   BILITOT 0.2 05/12/2021   ALKPHOS 119 05/12/2021   AST 17 05/12/2021   ALT 16 05/12/2021   PROT 6.5 05/12/2021   ALBUMIN 4.3 05/12/2021   CALCIUM 9.5 05/12/2021   EGFR 97 05/12/2021   GFR 83.12 12/12/2015   Lab Results  Component Value Date   CHOL 193 01/06/2021   Lab Results  Component Value Date   HDL 34 (L) 01/06/2021   Lab Results  Component Value Date   LDLCALC 140 (H) 01/06/2021   Lab Results  Component Value Date   TRIG 101 01/06/2021   Lab Results  Component Value Date   CHOLHDL 5.7 (H) 01/06/2021   Lab Results  Component Value Date   HGBA1C 5.7 (H) 05/12/2021       Assessment & Plan:   Problem List Items Addressed This Visit       Respiratory   Sinusitis - Primary   Relevant Medications   fluticasone (FLONASE) 50 MCG/ACT nasal spray   amoxicillin-clavulanate (AUGMENTIN) 875-125 MG tablet   Patient has s/sx suggestive of sinusitis with symptoms ongoing >14 days so will start oral antibiotic therapy. Recommend nasal rinses, especially before using nasal spray (Flonase).  Advised to let me know if unable to get antibiotic (Augmentin). Follow up if symptoms fail to improve or worsen.   Meds ordered this encounter  Medications   fluticasone (FLONASE) 50 MCG/ACT nasal spray  Sig: Place 2 sprays into both nostrils daily.    Dispense:  16 g    Refill:  1    Order Specific Question:   Supervising Provider    Answer:   Beatrice Lecher D [[id number]]   amoxicillin-clavulanate (AUGMENTIN) 875-125 MG tablet    Sig: Take 1 tablet by mouth 2 (two) times daily.    Dispense:  20 tablet    Refill:  0    Order Specific Question:   Supervising Provider    Answer:   Beatrice Lecher D [[id number]]     Lorrene Reid, PA-C

## 2021-08-26 NOTE — Patient Instructions (Signed)
Dizziness Dizziness is a common problem. It is a feeling of unsteadiness or light-headedness. You may feel like you are about to faint. Dizziness can lead to injury if you stumble or fall. Anyone can become dizzy, but dizziness is more common in older adults. This condition can be caused by a number of things, including medicines, dehydration, or illness. Follow these instructions at home: Eating and drinking  Drink enough fluid to keep your urine pale yellow. This helps to keep you from becoming dehydrated. Try to drink more clear fluids, such as water. Do not drink alcohol. Limit your caffeine intake if told to do so by your health care provider. Check ingredients and nutrition facts to see if a food or beverage contains caffeine. Limit your salt (sodium) intake if told to do so by your health care provider. Check ingredients and nutrition facts to see if a food or beverage contains sodium. Activity  Avoid making quick movements. Rise slowly from chairs and steady yourself until you feel okay. In the morning, first sit up on the side of the bed. When you feel okay, stand slowly while you hold onto something until you know that your balance is good. If you need to stand in one place for a long time, move your legs often. Tighten and relax the muscles in your legs while you are standing. Do not drive or use machinery if you feel dizzy. Avoid bending down if you feel dizzy. Place items in your home so that they are easy for you to reach without leaning over. Lifestyle Do not use any products that contain nicotine or tobacco. These products include cigarettes, chewing tobacco, and vaping devices, such as e-cigarettes. If you need help quitting, ask your health care provider. Try to reduce your stress level by using methods such as yoga or meditation. Talk with your health care provider if you need help to manage your stress. General instructions Watch your dizziness for any changes. Take  over-the-counter and prescription medicines only as told by your health care provider. Talk with your health care provider if you think that your dizziness is caused by a medicine that you are taking. Tell a friend or a family member that you are feeling dizzy. If he or she notices any changes in your behavior, have this person call your health care provider. Keep all follow-up visits. This is important. Contact a health care provider if: Your dizziness does not go away or you have new symptoms. Your dizziness or light-headedness gets worse. You feel nauseous. You have reduced hearing. You have a fever. You have neck pain or a stiff neck. Your dizziness leads to an injury or a fall. Get help right away if: You vomit or have diarrhea and are unable to eat or drink anything. You have problems talking, walking, swallowing, or using your arms, hands, or legs. You feel generally weak. You have any bleeding. You are not thinking clearly or you have trouble forming sentences. It may take a friend or family member to notice this. You have chest pain, abdominal pain, shortness of breath, or sweating. Your vision changes or you develop a severe headache. These symptoms may represent a serious problem that is an emergency. Do not wait to see if the symptoms will go away. Get medical help right away. Call your local emergency services (911 in the [place] Do not drive yourself to the hospital. Summary Dizziness is a feeling of unsteadiness or light-headedness. This condition can be caused by a number of   things, including medicines, dehydration, or illness. Anyone can become dizzy, but dizziness is more common in older adults. Drink enough fluid to keep your urine pale yellow. Do not drink alcohol. Avoid making quick movements if you feel dizzy. Monitor your dizziness for any changes. This information is not intended to replace advice given to you by your health care provider. Make sure you discuss any  questions you have with your health care provider. Document Revised: 07/08/2020 Document Reviewed: 07/08/2020 Elsevier Patient Education  2022 Elsevier Inc.  

## 2021-09-03 ENCOUNTER — Telehealth: Payer: Self-pay | Admitting: Physician Assistant

## 2021-09-03 ENCOUNTER — Other Ambulatory Visit: Payer: Self-pay

## 2021-09-03 DIAGNOSIS — J019 Acute sinusitis, unspecified: Secondary | ICD-10-CM

## 2021-09-03 MED ORDER — METHYLPREDNISOLONE 4 MG PO TBPK: ORAL_TABLET | ORAL | 0 refills | Status: DC

## 2021-09-03 NOTE — Telephone Encounter (Signed)
Patient called and stated he was not feeling any better, still has sinus infection and vertigo. Is there any other medication you can prescribe him or does he need to be seen again? Please advise. [phone]

## 2021-09-03 NOTE — Telephone Encounter (Signed)
Called the patient back and left a voicemail letting patient know that we called in a medrol dose pack per Herb Grays.

## 2021-10-29 ENCOUNTER — Other Ambulatory Visit: Payer: Self-pay | Admitting: Physician Assistant

## 2021-10-29 DIAGNOSIS — F4329 Adjustment disorder with other symptoms: Secondary | ICD-10-CM

## 2021-11-10 NOTE — Progress Notes (Deleted)
?Established patient visit ? ? ?Patient: Brian Gay   DOB: 03-Jan-1966   56 y.o. Male  MRN: [id number] ?Visit Date: 11/11/2021 ? ?No chief complaint on file. ? ?Subjective  ?  ?HPI  ?Patient presents for follow up on hypertension, hyperlipidemia, and Vit D deficiency. Mood has been stable. Taking Prozac 20 mg as directed without issues. ? ?HTN: Pt denies chest pain, palpitations, dizziness or leg swelling. Taking medication as directed without side effects. Checks BP at home ***times/wk and readings range in ***. Pt follows a low salt diet. ? ?HLD: Pt taking medication as directed without issues. Denies side effects including myalgias and RUQ pain.  ? ?Vit D def: ? ?Medications: ?Outpatient Medications Prior to Visit  ?Medication Sig  ? amoxicillin-clavulanate (AUGMENTIN) 875-125 MG tablet Take 1 tablet by mouth 2 (two) times daily.  ? atorvastatin (LIPITOR) 80 MG tablet 1 tablet nightly before bedtime  ? Cholecalciferol (VITAMIN D3) 5000 units TABS 5,000 IU OTC vitamin D3 daily.  ? FLUoxetine (PROZAC) 20 MG capsule TAKE 1 CAPSULE BY MOUTH DAILY.  ? fluticasone (FLONASE) 50 MCG/ACT nasal spray Place 2 sprays into both nostrils daily.  ? losartan-hydrochlorothiazide (HYZAAR) 100-12.5 MG tablet TAKE 1 TABLET BY MOUTH DAILY.  ? methylPREDNISolone (MEDROL DOSEPAK) 4 MG TBPK tablet Take as directed on package.  ? Vitamin D, Ergocalciferol, (DRISDOL) 1.25 MG (50000 UNIT) CAPS capsule Take 1 capsule once weekly  ? ?No facility-administered medications prior to visit.  ? ? ?Review of Systems ?Review of Systems:  ?A fourteen system review of systems was performed and found to be positive as per HPI. ? ? ?Last CBC ?Lab Results  ?Component Value Date  ? WBC 6.0 05/12/2021  ? HGB 16.4 05/12/2021  ? HCT 48.3 05/12/2021  ? MCV 92 05/12/2021  ? MCH 31.1 05/12/2021  ? RDW 12.9 05/12/2021  ? PLT 199 05/12/2021  ? ?Last metabolic panel ?Lab Results  ?Component Value Date  ? GLUCOSE 86 05/12/2021  ? NA 139 05/12/2021  ? K 3.9  05/12/2021  ? CL 101 05/12/2021  ? CO2 21 05/12/2021  ? BUN 13 05/12/2021  ? CREATININE 0.93 05/12/2021  ? EGFR 97 05/12/2021  ? CALCIUM 9.5 05/12/2021  ? PROT 6.5 05/12/2021  ? ALBUMIN 4.3 05/12/2021  ? LABGLOB 2.2 05/12/2021  ? AGRATIO 2.0 05/12/2021  ? BILITOT 0.2 05/12/2021  ? ALKPHOS 119 05/12/2021  ? AST 17 05/12/2021  ? ALT 16 05/12/2021  ? ?Last lipids ?Lab Results  ?Component Value Date  ? CHOL 193 01/06/2021  ? HDL 34 (L) 01/06/2021  ? LDLCALC 140 (H) 01/06/2021  ? LDLDIRECT 148 (H) 05/12/2021  ? TRIG 101 01/06/2021  ? CHOLHDL 5.7 (H) 01/06/2021  ? ?Last hemoglobin A1c ?Lab Results  ?Component Value Date  ? HGBA1C 5.7 (H) 05/12/2021  ? ?Last thyroid functions ?Lab Results  ?Component Value Date  ? TSH 1.040 01/06/2021  ? ?Last vitamin D ?Lab Results  ?Component Value Date  ? VD25OH 30.6 05/12/2021  ? ?  Objective  ?  ?There were no vitals taken for this visit. ?BP Readings from Last 3 Encounters:  ?08/26/21 99/64  ?05/12/21 112/73  ?04/29/21 112/74  ? ?Wt Readings from Last 3 Encounters:  ?08/26/21 220 lb 3.2 oz (99.9 kg)  ?06/16/21 210 lb (95.3 kg)  ?05/12/21 212 lb 4.8 oz (96.3 kg)  ? ? ?Physical Exam  ?General:  Well Developed, well nourished, appropriate for stated age.  ?Neuro:  Alert and oriented,  extra-ocular muscles intact  ?HEENT:  Normocephalic,  atraumatic, neck supple  ?Skin:  no gross rash, warm, pink. ?Cardiac:  RRR, S1 S2 ?Respiratory: CTA B/L  ?Vascular:  Ext warm, no cyanosis apprec.; cap RF less 2 sec. ?Psych:  No HI/SI, judgement and insight good, Euthymic mood. Full Affect. ? ? ?No results found for any visits on 11/11/21. ? Assessment & Plan  ?  ? ?*** ?Problem List Items Addressed This Visit   ? ?  ? Cardiovascular and Mediastinum  ? Essential hypertension (Chronic)  ?  ? Other  ? Vitamin D deficiency (Chronic)  ? Hyperlipidemia - Primary  ? ? ?No follow-ups on file.  ?   ? ? ? ?Lorrene Reid, PA-C  ?[facility] ?[phone] (phone) ?[phone]  (fax) ? ?[facility] ?

## 2021-11-11 ENCOUNTER — Ambulatory Visit: Payer: 59 | Admitting: Physician Assistant

## 2021-11-11 ENCOUNTER — Other Ambulatory Visit: Payer: Self-pay

## 2021-11-11 DIAGNOSIS — I10 Essential (primary) hypertension: Secondary | ICD-10-CM

## 2021-11-11 DIAGNOSIS — F4329 Adjustment disorder with other symptoms: Secondary | ICD-10-CM

## 2021-11-11 DIAGNOSIS — E785 Hyperlipidemia, unspecified: Secondary | ICD-10-CM

## 2021-11-11 DIAGNOSIS — E559 Vitamin D deficiency, unspecified: Secondary | ICD-10-CM

## 2021-12-08 ENCOUNTER — Ambulatory Visit: Payer: 59 | Admitting: Physician Assistant

## 2021-12-08 ENCOUNTER — Encounter: Payer: Self-pay | Admitting: Physician Assistant

## 2021-12-08 VITALS — BP 107/63 | HR 77 | Temp 97.7°F | Ht 72.0 in | Wt 223.0 lb

## 2021-12-08 DIAGNOSIS — H6593 Unspecified nonsuppurative otitis media, bilateral: Secondary | ICD-10-CM

## 2021-12-08 DIAGNOSIS — I10 Essential (primary) hypertension: Secondary | ICD-10-CM | POA: Diagnosis not present

## 2021-12-08 DIAGNOSIS — E785 Hyperlipidemia, unspecified: Secondary | ICD-10-CM

## 2021-12-08 DIAGNOSIS — F4329 Adjustment disorder with other symptoms: Secondary | ICD-10-CM

## 2021-12-08 MED ORDER — ATORVASTATIN CALCIUM 80 MG PO TABS: ORAL_TABLET | ORAL | 1 refills | Status: DC

## 2021-12-08 MED ORDER — LOSARTAN POTASSIUM-HCTZ 100-12.5 MG PO TABS: 1.0000 | ORAL_TABLET | Freq: Every day | ORAL | 1 refills | Status: DC

## 2021-12-08 MED ORDER — FLUOXETINE HCL 20 MG PO CAPS: 20.0000 mg | ORAL_CAPSULE | Freq: Every day | ORAL | 1 refills | Status: DC

## 2021-12-08 NOTE — Assessment & Plan Note (Addendum)
-  Stable. -Continue current medication regimen.  -Will continue to monitor. 

## 2021-12-08 NOTE — Assessment & Plan Note (Signed)
-  Controlled. Continue current medication regimen. Will continue to monitor. 

## 2021-12-08 NOTE — Patient Instructions (Signed)
Exercising to Stay Healthy To become healthy and stay healthy, it is recommended that you do moderate-intensity and vigorous-intensity exercise. You can tell that you are exercising at a moderate intensity if your heart starts beating faster and you start breathing faster but can still hold a conversation. You can tell that you are exercising at a vigorous intensity if you are breathing much harder and faster and cannot hold a conversation while exercising. How can exercise benefit me? Exercising regularly is important. It has many health benefits, such as: Improving overall fitness, flexibility, and endurance. Increasing bone density. Helping with weight control. Decreasing body fat. Increasing muscle strength and endurance. Reducing stress and tension, anxiety, depression, or anger. Improving overall health. What guidelines should I follow while exercising? Before you start a new exercise program, talk with your health care provider. Do not exercise so much that you hurt yourself, feel dizzy, or get very short of breath. Wear comfortable clothes and wear shoes with good support. Drink plenty of water while you exercise to prevent dehydration or heat stroke. Work out until your breathing and your heartbeat get faster (moderate intensity). How often should I exercise? Choose an activity that you enjoy, and set realistic goals. Your health care provider can help you make an activity plan that is individually designed and works best for you. Exercise regularly as told by your health care provider. This may include: Doing strength training two times a week, such as: Lifting weights. Using resistance bands. Push-ups. Sit-ups. Yoga. Doing a certain intensity of exercise for a given amount of time. Choose from these options: A total of 150 minutes of moderate-intensity exercise every week. A total of 75 minutes of vigorous-intensity exercise every week. A mix of moderate-intensity and  vigorous-intensity exercise every week. Children, pregnant women, people who have not exercised regularly, people who are overweight, and older adults may need to talk with a health care provider about what activities are safe to perform. If you have a medical condition, be sure to talk with your health care provider before you start a new exercise program. What are some exercise ideas? Moderate-intensity exercise ideas include: Walking 1 mile (1.6 km) in about 15 minutes. Biking. Hiking. Golfing. Dancing. Water aerobics. Vigorous-intensity exercise ideas include: Walking 4.5 miles (7.2 km) or more in about 1 hour. Jogging or running 5 miles (8 km) in about 1 hour. Biking 10 miles (16.1 km) or more in about 1 hour. Lap swimming. Roller-skating or in-line skating. Cross-country skiing. Vigorous competitive sports, such as football, basketball, and soccer. Jumping rope. Aerobic dancing. What are some everyday activities that can help me get exercise? Yard work, such as: Pushing a lawn mower. Raking and bagging leaves. Washing your car. Pushing a stroller. Shoveling snow. Gardening. Washing windows or floors. How can I be more active in my day-to-day activities? Use stairs instead of an elevator. Take a walk during your lunch break. If you drive, park your car farther away from your work or school. If you take public transportation, get off one stop early and walk the rest of the way. Stand up or walk around during all of your indoor phone calls. Get up, stretch, and walk around every 30 minutes throughout the day. Enjoy exercise with a friend. Support to continue exercising will help you keep a regular routine of activity. Where to find more information You can find more information about exercising to stay healthy from: U.S. Department of Health and Human Services: www.hhs.gov Centers for Disease Control and Prevention (  CDC): www.cdc.gov Summary Exercising regularly is  important. It will improve your overall fitness, flexibility, and endurance. Regular exercise will also improve your overall health. It can help you control your weight, reduce stress, and improve your bone density. Do not exercise so much that you hurt yourself, feel dizzy, or get very short of breath. Before you start a new exercise program, talk with your health care provider. This information is not intended to replace advice given to you by your health care provider. Make sure you discuss any questions you have with your health care provider. Document Revised: 11/29/2020 Document Reviewed: 11/29/2020 Elsevier Patient Education  2023 Elsevier Inc.  

## 2021-12-08 NOTE — Assessment & Plan Note (Signed)
-  Last direct LDL 148, discussed with patient improving medication adherence with atorvastatin 80 mg. Will repeat lipid panel and hepatic function with annual physical. If LDL fails to improve then will consider changing to rosuvastatin if pt has been compliant. Will continue to monitor. ?

## 2021-12-08 NOTE — Progress Notes (Signed)
?Established patient visit ? ? ?Patient: Brian Gay   DOB: 03/07/1966   56 y.o. Male  MRN: [id number] ?Visit Date: 12/08/2021 ? ?No chief complaint on file. ? ?Subjective  ?  ?HPI  ?Patient presents for chronic f/up. Patient has c/o bilateral ear fullness. No fever, otorrhea, sinus pressure, headache or cough.  ? ?HTN: Pt denies chest pain, palpitations, dizziness, syncope or lower extremity swelling. Taking medication as directed without side effects.  ? ?HLD: Pt reports sometimes does miss a few doses of medication. Denies side effects including myalgias. No diet changes. ? ?Mood: Reports medication compliance. Denies labile mood, SI or HI. ? ? ?Medications: ?Outpatient Medications Prior to Visit  ?Medication Sig  ? Cholecalciferol (VITAMIN D3) 5000 units TABS 5,000 IU OTC vitamin D3 daily.  ? fluticasone (FLONASE) 50 MCG/ACT nasal spray Place 2 sprays into both nostrils daily.  ? Vitamin D, Ergocalciferol, (DRISDOL) 1.25 MG (50000 UNIT) CAPS capsule Take 1 capsule once weekly  ? [DISCONTINUED] amoxicillin-clavulanate (AUGMENTIN) 875-125 MG tablet Take 1 tablet by mouth 2 (two) times daily.  ? [DISCONTINUED] atorvastatin (LIPITOR) 80 MG tablet 1 tablet nightly before bedtime  ? [DISCONTINUED] FLUoxetine (PROZAC) 20 MG capsule TAKE 1 CAPSULE BY MOUTH DAILY.  ? [DISCONTINUED] losartan-hydrochlorothiazide (HYZAAR) 100-12.5 MG tablet TAKE 1 TABLET BY MOUTH DAILY.  ? [DISCONTINUED] methylPREDNISolone (MEDROL DOSEPAK) 4 MG TBPK tablet Take as directed on package.  ? ?No facility-administered medications prior to visit.  ? ? ?Review of Systems ?Review of Systems:  ?A fourteen system review of systems was performed and found to be positive as per HPI. ? ? ?Last CBC ?Lab Results  ?Component Value Date  ? WBC 6.0 05/12/2021  ? HGB 16.4 05/12/2021  ? HCT 48.3 05/12/2021  ? MCV 92 05/12/2021  ? MCH 31.1 05/12/2021  ? RDW 12.9 05/12/2021  ? PLT 199 05/12/2021  ? ?Last metabolic panel ?Lab Results  ?Component Value Date  ?  GLUCOSE 86 05/12/2021  ? NA 139 05/12/2021  ? K 3.9 05/12/2021  ? CL 101 05/12/2021  ? CO2 21 05/12/2021  ? BUN 13 05/12/2021  ? CREATININE 0.93 05/12/2021  ? EGFR 97 05/12/2021  ? CALCIUM 9.5 05/12/2021  ? PROT 6.5 05/12/2021  ? ALBUMIN 4.3 05/12/2021  ? LABGLOB 2.2 05/12/2021  ? AGRATIO 2.0 05/12/2021  ? BILITOT 0.2 05/12/2021  ? ALKPHOS 119 05/12/2021  ? AST 17 05/12/2021  ? ALT 16 05/12/2021  ? ?Last lipids ?Lab Results  ?Component Value Date  ? CHOL 193 01/06/2021  ? HDL 34 (L) 01/06/2021  ? LDLCALC 140 (H) 01/06/2021  ? LDLDIRECT 148 (H) 05/12/2021  ? TRIG 101 01/06/2021  ? CHOLHDL 5.7 (H) 01/06/2021  ? ?Last hemoglobin A1c ?Lab Results  ?Component Value Date  ? HGBA1C 5.7 (H) 05/12/2021  ? ?Last thyroid functions ?Lab Results  ?Component Value Date  ? TSH 1.040 01/06/2021  ? ? ?  12/08/2021  ?  2:57 PM 08/26/2021  ?  2:53 PM 06/16/2021  ? 10:41 AM 05/12/2021  ?  1:15 PM 01/09/2021  ?  1:53 PM  ?Depression screen PHQ 2/9  ?Decreased Interest 0 0 0 0 0  ?Down, Depressed, Hopeless 0 0 0 0 0  ?PHQ - 2 Score 0 0 0 0 0  ?Altered sleeping 0 0 0 0 0  ?Tired, decreased energy 0 0 0 0 3  ?Change in appetite 0 0 0 0 0  ?Feeling bad or failure about yourself  0 0 0 0 0  ?Trouble concentrating 0   0 0 0 0  ?Moving slowly or fidgety/restless 0 0 0 0 0  ?Suicidal thoughts 0 0 0 0 0  ?PHQ-9 Score 0 0 0 0 3  ?Difficult doing work/chores Not difficult at all Not difficult at all Not difficult at all    ? ? ?  12/08/2021  ?  2:57 PM 08/26/2021  ?  2:54 PM 06/16/2021  ? 10:41 AM 05/12/2021  ?  1:15 PM  ?GAD 7 : Generalized Anxiety Score  ?Nervous, Anxious, on Edge 0 0 0 0  ?Control/stop worrying 0 0 0 0  ?Worry too much - different things 0 0 0 0  ?Trouble relaxing 0 0 0 0  ?Restless 0 0 0 0  ?Easily annoyed or irritable 0 0 0 0  ?Afraid - awful might happen 0 0 0 0  ?Total GAD 7 Score 0 0 0 0  ?Anxiety Difficulty Not difficult at all Not difficult at all Not difficult at all   ? ? ? ?  ?  Objective  ?  ?BP 107/63   Pulse 77   Temp  97.7 ?F (36.5 ?C)   Ht 6' (1.829 m)   Wt 223 lb (101.2 kg)   SpO2 95%   BMI 30.24 kg/m?  ?BP Readings from Last 3 Encounters:  ?12/08/21 107/63  ?08/26/21 99/64  ?05/12/21 112/73  ? ?Wt Readings from Last 3 Encounters:  ?12/08/21 223 lb (101.2 kg)  ?08/26/21 220 lb 3.2 oz (99.9 kg)  ?06/16/21 210 lb (95.3 kg)  ? ? ?Physical Exam  ?General:  Well Developed, well nourished, appropriate for stated age.  ?Neuro:  Alert and oriented,  extra-ocular muscles intact  ?HEENT:  Normocephalic, atraumatic, no sinus tenderness, middle ear effusion b/l, normal external ear canal of both ears, neg Tragus and Pinna sign's bilaterally, neck supple, no adenopathy   ?Skin:  no gross rash, warm, pink. ?Cardiac:  RRR, S1 S2 ?Respiratory: CTA B/L  ?Vascular:  Ext warm, no cyanosis apprec.; cap RF less 2 sec. ?Psych:  No HI/SI, judgement and insight good, Euthymic mood. Full Affect. ? ? ?No results found for any visits on 12/08/21. ? Assessment & Plan  ?  ? ? ?Problem List Items Addressed This Visit   ? ?  ? Cardiovascular and Mediastinum  ? Essential hypertension (Chronic)  ?  -Controlled. ?-Continue current medication regimen.  ?-Will continue to monitor. ? ?  ?  ? Relevant Medications  ? atorvastatin (LIPITOR) 80 MG tablet  ? losartan-hydrochlorothiazide (HYZAAR) 100-12.5 MG tablet  ?  ? Other  ? Stress and adjustment reaction (Chronic)  ?  -Stable. Continue current medication regimen. Will continue to monitor. ? ?  ?  ? Relevant Medications  ? FLUoxetine (PROZAC) 20 MG capsule  ? Hyperlipidemia  ?  -Last direct LDL 148, discussed with patient improving medication adherence with atorvastatin 80 mg. Will repeat lipid panel and hepatic function with annual physical. If LDL fails to improve then will consider changing to rosuvastatin if pt has been compliant. Will continue to monitor. ? ?  ?  ? Relevant Medications  ? atorvastatin (LIPITOR) 80 MG tablet  ? losartan-hydrochlorothiazide (HYZAAR) 100-12.5 MG tablet  ? ?Discussed with  patient has no s/sx consistent with AOM. Has bilateral middle ear effusion. Recommend to start taking daily oral antihistamine for allergies. Discussed symptoms to monitor for that are consistent with ear infection.  ? ?Return in about 6 months (around 06/09/2022) for CPE and FBW.  ?   ? ? ? ?Lorrene Reid,  PA-C  ?[facility] ?[phone] (phone) ?[phone] (fax) ? ?[facility] ?

## 2022-06-05 ENCOUNTER — Ambulatory Visit: Payer: 59 | Admitting: Family Medicine

## 2022-06-05 VITALS — BP 136/72 | HR 71 | Ht <= 58 in | Wt 218.0 lb

## 2022-06-05 DIAGNOSIS — E785 Hyperlipidemia, unspecified: Secondary | ICD-10-CM | POA: Diagnosis not present

## 2022-06-05 DIAGNOSIS — Z125 Encounter for screening for malignant neoplasm of prostate: Secondary | ICD-10-CM | POA: Diagnosis not present

## 2022-06-05 DIAGNOSIS — N50812 Left testicular pain: Secondary | ICD-10-CM

## 2022-06-05 DIAGNOSIS — I10 Essential (primary) hypertension: Secondary | ICD-10-CM | POA: Diagnosis not present

## 2022-06-05 NOTE — Progress Notes (Signed)
Subjective:    Patient ID: Brian Gay, male    DOB: Jul 24, 1966, 56 y.o.   MRN: [id number]  HPI  Patient is a very pleasant 56 year old Caucasian gentleman here today to establish care.  He is also concerned because he is having pain in his left testicle.  Adjacent to his left testicle is a nodular lesion.  I believe it is a small varicocele.  It is not attached to the testicle and appears to be in the spermatic cord.  However is extremely tender to palpation.  In fact during our exam the patient literally jumps when I palpated the area.  Is roughly 5 mm in diameter.  Otherwise he is doing well.  His last colonoscopy was in 2017.  They found for hyperplastic polyps.  Next colonoscopy is due in 2027.  He is overdue for a PSA to screen for prostate cancer.  His father died of heart disease but also have prostate cancer.  He has had his shingles vaccine.  His tetanus shots are up-to-date.  However he is due for COVID booster which we discussed today.  Otherwise he is doing well with no concerns. Past Medical History:  Diagnosis Date   Allergy    Hypertension    Kidney stones    Past Surgical History:  Procedure Laterality Date   CLOSED REDUCTION FINGER WITH PERCUTANEOUS PINNING Right 03/19/2016   Procedure: CLOSED REDUCTION right ring  FINGER;  Surgeon: Daryll Brod, MD;  Location: [facility];  Service: Orthopedics;  Laterality: Right;   cyst removed      pilonidal cyst   WISDOM TOOTH EXTRACTION     Current Outpatient Medications on File Prior to Visit  Medication Sig Dispense Refill   FLUoxetine (PROZAC) 20 MG capsule Take 1 capsule (20 mg total) by mouth daily. 90 capsule 1   losartan-hydrochlorothiazide (HYZAAR) 100-12.5 MG tablet Take 1 tablet by mouth daily. 90 tablet 1   No current facility-administered medications on file prior to visit.   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   Social Determinants of Health   Financial Resource Strain: Not on file  Food Insecurity: Not on file  Transportation Needs: Not on file  Physical Activity: Not on file  Stress: Not on file  Social Connections: Not on file  Intimate Partner Violence: Not on file   Family History  Problem Relation Age of Onset   Heart disease Mother    Hypertension Mother    Heart  disease Father    Cancer Father        prostate   Prostate cancer Father    Hyperlipidemia Father    Stroke Father    Heart disease Maternal Grandmother    Heart disease Maternal Grandfather    Heart disease Paternal Grandmother    Heart disease Paternal Grandfather    Colon cancer Neg Hx    Colon polyps Neg Hx    Rectal cancer Neg Hx    Stomach cancer Neg Hx      Review of Systems  Genitourinary:  Positive for testicular pain. Negative for genital sores, hematuria, penile discharge, penile pain, penile swelling and scrotal swelling.  All other systems reviewed and are negative.      Objective:   Physical Exam Vitals reviewed.  Constitutional:      General: He is not in acute distress.    Appearance: Normal appearance. He is normal weight. He is not ill-appearing or toxic-appearing.   HENT:     Head: Normocephalic and atraumatic.     Right Ear: Tympanic membrane and ear canal normal.     Left Ear: Tympanic membrane and ear canal normal.     Nose: Nose normal. No congestion or rhinorrhea.     Mouth/Throat:     Mouth: Mucous membranes are moist.     Pharynx: Oropharynx is clear. No oropharyngeal exudate or posterior oropharyngeal erythema.  Eyes:     Extraocular Movements: Extraocular movements intact.     Conjunctiva/sclera: Conjunctivae normal.     Pupils: Pupils are equal, round, and reactive to light.  Neck:     Vascular: No carotid bruit.  Cardiovascular:     Rate and Rhythm: Normal rate and regular rhythm.     Pulses: Normal pulses.     Heart sounds: Normal heart sounds. No murmur heard.    No friction rub. No gallop.  Pulmonary:     Effort: Pulmonary effort is normal. No respiratory distress.     Breath sounds: Normal breath sounds. No stridor. No wheezing, rhonchi or rales.  Abdominal:     General: Bowel sounds are normal. There is no distension.     Palpations: Abdomen is soft. There is no mass.     Tenderness: There is no abdominal tenderness. There is no guarding or rebound.     Hernia: No hernia is present.  Genitourinary:    Penis: Normal.      Testes:        Right: Mass or tenderness not present.        Left: Tenderness and varicocele present.  Musculoskeletal:        General: Normal range of motion.     Cervical back: Normal range of motion. No rigidity or tenderness.     Right lower leg: No edema.     Left lower leg: No edema.  Lymphadenopathy:     Cervical: No cervical adenopathy.  Skin:    General: Skin is warm.     Coloration: Skin is not jaundiced.     Findings: No bruising, erythema or lesion.  Neurological:     General: No focal deficit present.     Mental Status: He is alert and oriented to person, place, and time. Mental status is at baseline.     Cranial Nerves: No cranial nerve deficit.     Motor: No weakness.      Coordination: Coordination normal.     Gait: Gait normal.     Deep Tendon Reflexes: Reflexes normal.  Psychiatric:        Mood and Affect: Mood normal.        Thought Content: Thought content normal.        Judgment: Judgment normal.           Assessment & Plan:  Testicular pain, left - Plan: US Scrotum  Hyperlipidemia, unspecified hyperlipidemia type  Essential hypertension   I believe the patient is dealing with a varicocele.  He also has a history of a vasectomy when he was 56 years old.  Is possible that this could be inflamed scar tissue.  Proceed with an ultrasound of the scrotum to evaluate further.  I reassured the patient that I do not see any sign of malignancy or infection.  I recommended that he return fasting for CBC CMP lipid panel and PSA.  Colonoscopy is up-to-date.  Recommended a COVID booster.

## 2022-06-09 ENCOUNTER — Other Ambulatory Visit: Payer: 59

## 2022-06-09 DIAGNOSIS — E559 Vitamin D deficiency, unspecified: Secondary | ICD-10-CM

## 2022-06-09 DIAGNOSIS — N50812 Left testicular pain: Secondary | ICD-10-CM

## 2022-06-09 DIAGNOSIS — E785 Hyperlipidemia, unspecified: Secondary | ICD-10-CM

## 2022-06-09 DIAGNOSIS — Z125 Encounter for screening for malignant neoplasm of prostate: Secondary | ICD-10-CM

## 2022-06-09 DIAGNOSIS — I10 Essential (primary) hypertension: Secondary | ICD-10-CM

## 2022-06-10 LAB — CBC WITH DIFFERENTIAL/PLATELET
Absolute Monocytes: 528 cells/uL (ref 200–950)
Basophils Absolute: 29 cells/uL (ref 0–200)
Basophils Relative: 0.5 %
Eosinophils Absolute: 87 cells/uL (ref 15–500)
Eosinophils Relative: 1.5 %
HCT: 51.9 % — ABNORMAL HIGH (ref 38.5–50.0)
Hemoglobin: 16.9 g/dL (ref 13.2–17.1)
Lymphs Abs: 1380 cells/uL (ref 850–3900)
MCH: 30.5 pg (ref 27.0–33.0)
MCHC: 32.6 g/dL (ref 32.0–36.0)
MCV: 93.7 fL (ref 80.0–100.0)
MPV: 11.5 fL (ref 7.5–12.5)
Monocytes Relative: 9.1 %
Neutro Abs: 3776 cells/uL (ref 1500–7800)
Neutrophils Relative %: 65.1 %
Platelets: 180 10*3/uL (ref 140–400)
RBC: 5.54 10*6/uL (ref 4.20–5.80)
RDW: 12.3 % (ref 11.0–15.0)
Total Lymphocyte: 23.8 %
WBC: 5.8 10*3/uL (ref 3.8–10.8)

## 2022-06-10 LAB — COMPLETE METABOLIC PANEL WITH GFR
AG Ratio: 1.8 (calc) (ref 1.0–2.5)
ALT: 18 U/L (ref 9–46)
AST: 15 U/L (ref 10–35)
Albumin: 4.4 g/dL (ref 3.6–5.1)
Alkaline phosphatase (APISO): 89 U/L (ref 35–144)
BUN: 13 mg/dL (ref 7–25)
CO2: 26 mmol/L (ref 20–32)
Calcium: 9.5 mg/dL (ref 8.6–10.3)
Chloride: 103 mmol/L (ref 98–110)
Creat: 1.07 mg/dL (ref 0.70–1.30)
Globulin: 2.4 g/dL (calc) (ref 1.9–3.7)
Glucose, Bld: 87 mg/dL (ref 65–99)
Potassium: 4.4 mmol/L (ref 3.5–5.3)
Sodium: 138 mmol/L (ref 135–146)
Total Bilirubin: 0.7 mg/dL (ref 0.2–1.2)
Total Protein: 6.8 g/dL (ref 6.1–8.1)
eGFR: 81 mL/min/{1.73_m2} (ref >=60)

## 2022-06-10 LAB — LIPID PANEL
Cholesterol: 182 mg/dL (ref <200)
HDL: 35 mg/dL — ABNORMAL LOW (ref >=40)
LDL Cholesterol (Calc): 128 mg/dL (calc) — ABNORMAL HIGH
Non-HDL Cholesterol (Calc): 147 mg/dL (calc) — ABNORMAL HIGH (ref <130)
Total CHOL/HDL Ratio: 5.2 (calc) — ABNORMAL HIGH (ref <5.0)
Triglycerides: 93 mg/dL (ref <150)

## 2022-06-10 LAB — PSA: PSA: 1.21 ng/mL (ref <=4.00)

## 2022-06-11 ENCOUNTER — Other Ambulatory Visit: Payer: Self-pay

## 2022-06-11 DIAGNOSIS — E785 Hyperlipidemia, unspecified: Secondary | ICD-10-CM

## 2022-06-11 MED ORDER — ROSUVASTATIN CALCIUM 10 MG PO TABS: 10.0000 mg | ORAL_TABLET | Freq: Every day | ORAL | 3 refills | Status: DC

## 2022-06-16 ENCOUNTER — Encounter: Payer: 59 | Admitting: Physician Assistant

## 2022-07-03 ENCOUNTER — Ambulatory Visit
Admission: RE | Admit: 2022-07-03 | Discharge: 2022-07-03 | Disposition: A | Discharge status: Home / Self Care | Payer: 59 | Source: Ambulatory Visit | Attending: Family Medicine | Admitting: Family Medicine

## 2022-07-03 DIAGNOSIS — N50812 Left testicular pain: Secondary | ICD-10-CM

## 2022-07-13 ENCOUNTER — Other Ambulatory Visit: Payer: Self-pay

## 2022-07-13 DIAGNOSIS — I10 Essential (primary) hypertension: Secondary | ICD-10-CM

## 2022-07-13 DIAGNOSIS — F4329 Adjustment disorder with other symptoms: Secondary | ICD-10-CM

## 2022-07-13 MED ORDER — FLUOXETINE HCL 20 MG PO CAPS: 20.0000 mg | ORAL_CAPSULE | Freq: Every day | ORAL | 3 refills | Status: AC

## 2022-07-13 MED ORDER — LOSARTAN POTASSIUM-HCTZ 100-12.5 MG PO TABS: 1.0000 | ORAL_TABLET | Freq: Every day | ORAL | 3 refills | Status: DC

## 2023-05-20 ENCOUNTER — Ambulatory Visit: Payer: 59 | Admitting: Family Medicine

## 2023-05-20 ENCOUNTER — Encounter: Payer: Self-pay | Admitting: Family Medicine

## 2023-05-20 VITALS — BP 124/72 | HR 73 | Temp 97.9°F | Ht 72.0 in | Wt 222.0 lb

## 2023-05-20 DIAGNOSIS — I10 Essential (primary) hypertension: Secondary | ICD-10-CM | POA: Diagnosis not present

## 2023-05-20 DIAGNOSIS — M5432 Sciatica, left side: Secondary | ICD-10-CM

## 2023-05-20 MED ORDER — GABAPENTIN 300 MG PO CAPS: 300.0000 mg | ORAL_CAPSULE | Freq: Three times a day (TID) | ORAL | 3 refills | Status: AC | PRN

## 2023-05-20 MED ORDER — LOSARTAN POTASSIUM-HCTZ 100-12.5 MG PO TABS: 1.0000 | ORAL_TABLET | Freq: Every day | ORAL | 3 refills | Status: DC

## 2023-05-20 MED ORDER — PREDNISONE 20 MG PO TABS: 60.0000 mg | ORAL_TABLET | Freq: Every day | ORAL | 0 refills | Status: AC

## 2023-05-20 NOTE — Progress Notes (Signed)
Subjective:    Patient ID: Brian Gay, male    DOB: 04-17-1966, 57 y.o.   MRN: [id number]  Leg Pain   Patient states for 4 months, he has had pain starting in his left gluteus radiating down his left leg all the way into his left ankle and his left foot.  The pain is an intense burning throbbing pain.  It hurts to stand and walk.  He has a history of degenerative disc disease.  He denies any numbness or tingling in his leg however he does a electric is going about all the time today on exam, he has upon straight leg raise on the left.  He has no tenderness to palpation in the lumbar spine.  However standing exacerbates the pain.   Past Medical History:  Diagnosis Date   Allergy    Hypertension    Kidney stones    Past Surgical History:  Procedure Laterality Date   CLOSED REDUCTION FINGER WITH PERCUTANEOUS PINNING Right 03/19/2016   Procedure: CLOSED REDUCTION right ring  FINGER;  Surgeon: Cindee Salt, MD;  Location: [facility];  Service: Orthopedics;  Laterality: Right;   cyst removed      pilonidal cyst   WISDOM TOOTH EXTRACTION     Current Outpatient Medications on File Prior to Visit  Medication Sig Dispense Refill   losartan-hydrochlorothiazide (HYZAAR) 100-12.5 MG tablet Take 1 tablet by mouth daily. 90 tablet 3   rosuvastatin (CRESTOR) 10 MG tablet Take 1 tablet (10 mg total) by mouth daily. 90 tablet 3   FLUoxetine (PROZAC) 20 MG capsule Take 1 capsule (20 mg total) by mouth daily. (Patient not taking: Reported on 05/20/2023) 90 capsule 3   No current facility-administered medications on file prior to visit.   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   Social Determinants of Health   Financial Resource Strain: Not on file  Food Insecurity: Not on file  Transportation Needs: Not on file  Physical Activity: Not on file  Stress: Not on file  Social Connections: Not on file  Intimate Partner Violence: Not on file   Family History  Problem Relation Age of Onset   Heart disease Mother    Hypertension Mother    Heart disease Father    Cancer Father        prostate   Prostate cancer Father    Hyperlipidemia Father    Stroke Father  Heart disease Maternal Grandmother    Heart disease Maternal Grandfather    Heart disease Paternal Grandmother    Heart disease Paternal Grandfather    Colon cancer Neg Hx    Colon polyps Neg Hx    Rectal cancer Neg Hx    Stomach cancer Neg Hx      Review of Systems  Genitourinary:  Positive for testicular pain. Negative for genital sores, hematuria, penile discharge, penile pain, penile swelling and scrotal swelling.  All other systems reviewed and are negative.      Objective:   Physical Exam Vitals reviewed.  Constitutional:      General: He is not in acute distress.    Appearance: Normal appearance. He is normal weight. He is not ill-appearing or toxic-appearing.  HENT:     Head: Normocephalic and atraumatic.  Neck:     Vascular: No carotid bruit.  Cardiovascular:     Rate and Rhythm: Normal rate and regular rhythm.      Pulses: Normal pulses.     Heart sounds: Normal heart sounds. No murmur heard.    No friction rub. No gallop.  Pulmonary:     Effort: Pulmonary effort is normal. No respiratory distress.     Breath sounds: Normal breath sounds. No stridor. No wheezing, rhonchi or rales.  Genitourinary:    Penis: Normal.      Testes:        Right: Mass or tenderness not present.        Left: Tenderness and varicocele present.  Musculoskeletal:     Cervical back: Normal range of motion. No rigidity or tenderness.     Lumbar back: No swelling, spasms, tenderness or bony tenderness. Decreased range of motion. Positive left straight leg raise test. Negative right straight leg raise test.     Right lower leg: No edema.     Left lower leg: No edema.       Legs:  Lymphadenopathy:     Cervical: No cervical adenopathy.  Skin:    General: Skin is warm.     Coloration: Skin is not jaundiced.     Findings: No bruising, erythema or lesion.  Neurological:     General: No focal deficit present.     Mental Status: He is alert and oriented to person, place, and time. Mental status is at baseline.     Cranial Nerves: No cranial nerve deficit.     Motor: No weakness.     Coordination: Coordination normal.     Gait: Gait normal.     Deep Tendon Reflexes: Reflexes normal.  Psychiatric:        Mood and Affect: Mood normal.        Thought Content: Thought content normal.        Judgment: Judgment normal.           Assessment & Plan:  Left sided sciatica - Plan: DG Lumbar Spine Complete  Essential hypertension - Plan: losartan-hydrochlorothiazide (HYZAAR) 100-12.5 MG tablet Symptoms been present for 4 months.  Obtain an x-ray of the lumbar spine to evaluate further.  Begin with prednisone 60 mg a day for 7 days and add gabapentin 300 mg p.o. 3 times daily as needed nerve pain.

## 2023-05-21 ENCOUNTER — Ambulatory Visit
Admission: RE | Admit: 2023-05-21 | Discharge: 2023-05-21 | Disposition: A | Discharge status: Home / Self Care | Payer: 59 | Source: Ambulatory Visit | Attending: Family Medicine | Admitting: Family Medicine

## 2023-05-21 DIAGNOSIS — M5432 Sciatica, left side: Secondary | ICD-10-CM

## 2023-06-01 ENCOUNTER — Encounter: Payer: Self-pay | Admitting: Family Medicine

## 2023-06-03 ENCOUNTER — Other Ambulatory Visit: Payer: Self-pay | Admitting: Family Medicine

## 2023-08-07 ENCOUNTER — Other Ambulatory Visit: Payer: Self-pay | Admitting: Family Medicine

## 2023-08-07 DIAGNOSIS — I10 Essential (primary) hypertension: Secondary | ICD-10-CM

## 2023-08-09 NOTE — Telephone Encounter (Signed)
Rx 05/20/23 #90 3RF- too soon Requested Prescriptions  Pending Prescriptions Disp Refills   losartan-hydrochlorothiazide (HYZAAR) 100-12.5 MG tablet [Pharmacy Med Name: LOSARTAN-HCTZ 100-12.5 MG T 100-12.5 Tablet] 30 tablet 3    Sig: TAKE 1 TABLET BY MOUTH DAILY.     Cardiovascular: ARB + Diuretic Combos Failed - 08/09/2023  4:26 PM      Failed - K in normal range and within 180 days    Potassium  Date Value Ref Range Status  06/09/2022 4.4 3.5 - 5.3 mmol/L Final         Failed - Na in normal range and within 180 days    Sodium  Date Value Ref Range Status  06/09/2022 138 135 - 146 mmol/L Final  05/12/2021 139 134 - 144 mmol/L Final         Failed - Cr in normal range and within 180 days    Creat  Date Value Ref Range Status  06/09/2022 1.07 0.70 - 1.30 mg/dL Final         Failed - eGFR is 10 or above and within 180 days    GFR calc Af Amer  Date Value Ref Range Status  09/11/2020 103 >59 mL/min/1.73 Final    Comment:    **In accordance with recommendations from the NKF-ASN Task force,**   [facility] is in the process of updating its eGFR calculation to the   2021 CKD-EPI creatinine equation that estimates kidney function   without a race variable.    GFR calc non Af Amer  Date Value Ref Range Status  09/11/2020 89 >59 mL/min/1.73 Final   GFR  Date Value Ref Range Status  12/12/2015 83.12 >60.00 mL/min Final   eGFR  Date Value Ref Range Status  06/09/2022 81 > OR = 60 mL/min/1.54m2 Final  05/12/2021 97 >59 mL/min/1.73 Final         Failed - Valid encounter within last 6 months    Recent Outpatient Visits           5 years ago Perineal abscess   Primary Care at [facility], Madelaine Bhat, PA-C   5 years ago Abscess, perineum   Primary Care at [facility], Madelaine Bhat, PA-C   5 years ago Abscess of perineum   Primary Care at [facility], Madelaine Bhat, PA-C   5 years ago Abscess of perineum   Primary Care at [facility], Levell July, MD   10 years  ago Sinusitis, acute maxillary   Primary Care at [facility], Levell July, MD              Passed - Patient is not pregnant      Passed - Last BP in normal range    BP Readings from Last 1 Encounters:  05/20/23 124/72

## 2023-08-17 ENCOUNTER — Telehealth: Payer: Self-pay | Admitting: Pulmonary Disease

## 2023-08-17 NOTE — Telephone Encounter (Signed)
 I spoke with the patient. He said he just received a new mask from [employer] but his insurance is refusing to back because we did not sign off on the order.  I spoke with Mitch with [employer]. He said they sent over an order to sign but our office refused because we have not seen him since 2022. Mitch said once the patient is seen in the office and we send over a note saying that the patient is using him CPAP daily, his insurance should pay for the mask.  Lm x1 for the patient.

## 2023-08-17 NOTE — Telephone Encounter (Signed)
Pt came in office to make appointment and says his insurance is not covering for something any more

## 2023-08-19 NOTE — Telephone Encounter (Signed)
 Lm x2 for the patient and I have sent him a MyChart message notifying him.  Nothing further needed.

## 2023-09-29 ENCOUNTER — Encounter: Payer: Self-pay | Admitting: Pulmonary Disease

## 2023-09-29 ENCOUNTER — Ambulatory Visit: Payer: 59 | Admitting: Pulmonary Disease

## 2023-09-29 VITALS — BP 122/78 | HR 93 | Ht 72.0 in | Wt 223.0 lb

## 2023-09-29 DIAGNOSIS — G4733 Obstructive sleep apnea (adult) (pediatric): Secondary | ICD-10-CM

## 2023-09-29 NOTE — Patient Instructions (Signed)
I will see you back in about a year  We will send a prescription to the medical supply company to help you with a different mask  Call us, message [place] with any significant concerns

## 2023-09-29 NOTE — Progress Notes (Signed)
Brian Gay    [id number]    1966-04-18  Primary Care Physician:Pickard, Priscille Heidelberg, MD  Referring Physician: Donita Brooks, MD [address] [place],  [place] [address]  Chief complaint:  Patient with severe obstructive sleep apnea In for follow-up   HPI:  Diagnosed with severe obstructive sleep apnea  Sometimes has problems with humidification Needs a new mask  Tolerating CPAP well Benefiting from CPAP  Sleep is good  History of hypertension Reformed smoker  Sleep habits is acceptable to him  Memory is good Dad snored and his son does snore as well on CPAP   Outpatient Encounter Medications as of 09/29/2023  Medication Sig   losartan-hydrochlorothiazide (HYZAAR) 100-12.5 MG tablet Take 1 tablet by mouth daily.   FLUoxetine (PROZAC) 20 MG capsule Take 1 capsule (20 mg total) by mouth daily. (Patient not taking: Reported on 09/29/2023)   gabapentin (NEURONTIN) 300 MG capsule Take 1 capsule (300 mg total) by mouth 3 (three) times daily as needed. (Patient not taking: Reported on 09/29/2023)   predniSONE (DELTASONE) 20 MG tablet Take 3 tablets (60 mg total) by mouth daily with breakfast. (Patient not taking: Reported on 09/29/2023)   rosuvastatin (CRESTOR) 10 MG tablet Take 1 tablet (10 mg total) by mouth daily. (Patient not taking: Reported on 09/29/2023)   No facility-administered encounter medications on file as of 09/29/2023.    Allergies as of 09/29/2023 - Review Complete 09/29/2023  Allergen Reaction Noted   Codeine Swelling 11/02/2012    Past Medical History:  Diagnosis Date   Allergy    Hypertension    Kidney stones     Past Surgical History:  Procedure Laterality Date   CLOSED REDUCTION FINGER WITH PERCUTANEOUS PINNING Right 03/19/2016   Procedure: CLOSED REDUCTION right ring  FINGER;  Surgeon: Cindee Salt, MD;  Location: [facility];  Service: Orthopedics;  Laterality: Right;   cyst removed      pilonidal cyst   WISDOM  TOOTH EXTRACTION      Family History  Problem Relation Age of Onset   Heart disease Mother    Hypertension Mother    Heart disease Father    Cancer Father        prostate   Prostate cancer Father    Hyperlipidemia Father    Stroke Father    Heart disease Maternal Grandmother    Heart disease Maternal Grandfather    Heart disease Paternal Grandmother    Heart disease Paternal Grandfather    Colon cancer Neg Hx    Colon polyps Neg Hx    Rectal cancer Neg Hx    Stomach cancer Neg Hx     Social History   Socioeconomic History   Marital status: Married    Spouse name: Tammy   Number of children: 1   Years of education: 16   Highest education level: Not on file  Occupational History   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   Social Drivers of Corporate investment banker Strain: Not on file  Food Insecurity: Not on file  Transportation Needs: Not on file  Physical Activity: Not on file  Stress: Not on file  Social Connections: Not on file  Intimate Partner Violence: Not on file    Review of Systems  Constitutional:  Positive for fatigue.  Psychiatric/Behavioral:  Positive for sleep disturbance.     There were no vitals filed for this visit.    Physical Exam Constitutional:      Appearance: Normal appearance.  HENT:     Head: Normocephalic.      Nose: No congestion.     Mouth/Throat:     Mouth: Mucous membranes are moist.     Comments: Crowded oropharynx, Mallampati 3 Cardiovascular:     Rate and Rhythm: Normal rate and regular rhythm.     Pulses: Normal pulses.     Heart sounds: Normal heart sounds. No murmur heard.    No friction rub.  Pulmonary:     Effort: No respiratory distress.     Breath sounds: No stridor. No wheezing or rhonchi.  Neurological:     General: No focal deficit present.     Mental Status: He is alert.     Cranial Nerves: No cranial nerve deficit.  Psychiatric:        Mood and Affect: Mood normal.   Compliance data reviewed showing excellent compliance Average use of 8 hours 34 minutes Residual AHI of 0.1  Assessment:  Severe obstructive sleep apnea  Nonrestorative sleep  Benefiting from CPAP use  Using machine nightly   Plan/Recommendations: Continue using CPAP nightly  Download from machine shows it is working well  Follow-up a year from now  Call us with significant concerns  DME referral for CPAP supplies  Virl Diamond MD  Pulmonary and Critical Care 09/29/2023, 1:18 PM  CC: Donita Brooks, MD

## 2023-11-11 ENCOUNTER — Ambulatory Visit: Payer: Self-pay

## 2023-11-11 NOTE — Telephone Encounter (Signed)
  Chief Complaint: Heart flutter Symptoms: fluttering sensation in chest Frequency: intermittent X 6 months Pertinent Negatives: Patient denies chest pain, breathing difficulty, and all other symptoms Disposition: [] ED /[] Urgent Care (no appt availability in office) / [x] Appointment(In office/virtual)/ []  [place] [facility]/ [] Home Care/ [x] Refused Recommended Disposition /[] [place] [facility]/ []  Follow-up with PCP Additional Notes:  Follow up call to [name] after he set up an office visit for 11/23/23, he mentioned heart flutter and then refused triage, he disconnected call with agent. This Clinical research associate spoke with Brian Gay who was not really interested in triage "I'm fine, really", he was willing to answer a few of my questions, he denied chest pain, shortness of breath, and neuro symptoms. States he has been having intermittent heart flutter for approximately 6 months. He has not been evaluated by PCP for heart fluttering. States he feels fine in this moment without flutter. Offered to schedule appointment for today due to heart flutter with being on diuretic can increase risk of hypokalemia causing cardiac symptoms. Patient verbalizes understanding and thanks this Clinical research associate for concern but declines to move appointment from November 23, 2023. Educated on care advice as documented in protocol, patient verbalized understanding. Discussed reasons to call back or call for EMS.    Copied from CRM [id number]. Topic: Clinical - Red Word Triage >> Nov 11, 2023 11:52 AM Brian Gay wrote: Red Word that prompted transfer to Nurse Triage: PT called in to schedule an appt for Heart Flutters. Pt declined triage, while saying closing statement and disconnecting call pt did say he felt a "heart attack" before hanging up call and this was very concerning. Please reach out to pt [phone] - supervisor notified. Reason for Disposition  Taking water pill (i.e., diuretic) or heart medication (e.g., digoxin)  Protocols used: Heart  Rate and Heartbeat Questions-A-AH

## 2023-11-23 ENCOUNTER — Ambulatory Visit: Admitting: Family Medicine

## 2023-11-23 ENCOUNTER — Encounter: Payer: Self-pay | Admitting: Family Medicine

## 2023-11-23 ENCOUNTER — Ambulatory Visit: Attending: Family Medicine

## 2023-11-23 VITALS — BP 116/74 | HR 78 | Temp 97.7°F | Ht 72.0 in | Wt 200.2 lb

## 2023-11-23 DIAGNOSIS — R0789 Other chest pain: Secondary | ICD-10-CM | POA: Diagnosis not present

## 2023-11-23 DIAGNOSIS — R002 Palpitations: Secondary | ICD-10-CM

## 2023-11-23 NOTE — Progress Notes (Unsigned)
 EP to read.

## 2023-11-23 NOTE — Progress Notes (Signed)
 Subjective:    Patient ID: Brian Gay, male    DOB: 26-Jun-1966, 58 y.o.   MRN: [id number]  Patient is a 58 year old Caucasian gentleman who presents with palpitations.  He states that over the last month his heart will start fluttering occasionally for no reason.  EKG today is normal.  He states that he also occasionally has numbness in his left trapezius.  The numbness is only in the trapezius.  It does not radiate down his left arm.  He occasionally has some atypical pain in the center of his chest that is unrelated to exertion.  It lasts for a few seconds.  He denies any nausea or vomiting or arm pain associated with the chest pain.  He denies any palpitations associated with the chest pain.  He believes it may be related to stress. eKG shows nsr with 70 bpm normal intervals and normal axis and no evidence of ischemia.  Past Medical History:  Diagnosis Date   Allergy    Hypertension    Kidney stones    Past Surgical History:  Procedure Laterality Date   CLOSED REDUCTION FINGER WITH PERCUTANEOUS PINNING Right 03/19/2016   Procedure: CLOSED REDUCTION right ring  FINGER;  Surgeon: Cindee Salt, MD;  Location: [facility];  Service: Orthopedics;  Laterality: Right;   cyst removed      pilonidal cyst   WISDOM TOOTH EXTRACTION     Current Outpatient Medications on File Prior to Visit  Medication Sig Dispense Refill   losartan-hydrochlorothiazide (HYZAAR) 100-12.5 MG tablet Take 1 tablet by mouth daily. 90 tablet 3   FLUoxetine (PROZAC) 20 MG capsule Take 1 capsule (20 mg total) by mouth daily. (Patient not taking: Reported on 05/20/2023) 90 capsule 3   gabapentin (NEURONTIN) 300 MG capsule Take 1 capsule (300 mg total) by mouth 3 (three) times daily as needed. (Patient not taking: Reported on 11/23/2023) 90 capsule 3   predniSONE (DELTASONE) 20 MG tablet Take 3 tablets (60 mg total) by mouth daily with breakfast. (Patient not taking: Reported on 11/23/2023) 21 tablet 0   rosuvastatin  (CRESTOR) 10 MG tablet Take 1 tablet (10 mg total) by mouth daily. (Patient not taking: Reported on 11/23/2023) 90 tablet 3   No current facility-administered medications on file prior to visit.   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   Social Drivers of Health   Financial Resource Strain: Low Risk  (11/19/2023)  Overall Financial Resource Strain (CARDIA)    Difficulty of Paying Living Expenses: Not hard at all  Food Insecurity: No Food Insecurity (11/19/2023)   Hunger Vital Sign    Worried About Running Out of Food in the Last Year: Never true    Ran Out of Food in the Last Year: Never true  Transportation Needs: No Transportation Needs (11/19/2023)   PRAPARE - Administrator, Civil Service (Medical): No    Lack of Transportation (Non-Medical): No  Physical Activity: Unknown (11/19/2023)   Exercise Vital Sign    Days of Exercise per Week: 0 days    Minutes of Exercise per Session: Not on file  Stress: No Stress Concern Present (11/19/2023)   [employer] of Occupational Health - Occupational Stress  Questionnaire    Feeling of Stress : Only a little  Social Connections: Moderately Isolated (11/19/2023)   Social Connection and Isolation Panel [NHANES]    Frequency of Communication with Friends and Family: Once a week    Frequency of Social Gatherings with Friends and Family: Once a week    Attends Religious Services: More than 4 times per year    Active Member of [employer] or Organizations: No    Attends Engineer, structural: Not on file    Marital Status: Married  Catering manager Violence: Not on file   Family History  Problem Relation Age of Onset   Heart disease Mother    Hypertension Mother    Heart disease Father    Cancer Father        prostate   Prostate cancer Father    Hyperlipidemia Father    Stroke Father    Heart disease Maternal Grandmother    Heart disease Maternal Grandfather    Heart disease Paternal Grandmother    Heart disease Paternal Grandfather    Colon cancer Neg Hx    Colon polyps Neg Hx    Rectal cancer Neg Hx    Stomach cancer Neg Hx      Review of Systems  Cardiovascular:  Positive for palpitations.  All other systems reviewed and are negative.      Objective:   Physical Exam Vitals reviewed.  Constitutional:      General: He is not in acute distress.    Appearance: Normal appearance. He is normal weight. He is not ill-appearing or toxic-appearing.  HENT:     Head: Normocephalic and atraumatic.  Neck:     Vascular: No carotid bruit.  Cardiovascular:     Rate and Rhythm: Normal rate and regular rhythm.     Pulses: Normal pulses.     Heart sounds: Normal heart sounds. No murmur heard.    No friction rub. No gallop.  Pulmonary:     Effort: Pulmonary effort is normal. No respiratory distress.     Breath sounds: Normal breath sounds. No stridor. No wheezing, rhonchi or rales.  Musculoskeletal:     Right lower leg: No edema.     Left lower leg: No edema.  Lymphadenopathy:     Cervical: No cervical adenopathy.  Skin:     General: Skin is warm.     Coloration: Skin is not jaundiced.     Findings: No bruising, erythema or lesion.  Neurological:     General: No focal deficit present.     Mental Status: He is alert and oriented to person, place, and time. Mental status is at baseline.     Cranial Nerves: No cranial nerve deficit.  Motor: No weakness.     Coordination: Coordination normal.     Gait: Gait normal.     Deep Tendon Reflexes: Reflexes normal.           Assessment & Plan:  Palpitations - Plan: LONG TERM MONITOR (3-14 DAYS)  Atypical chest pain - Plan: CT CARDIAC SCORING (SELF PAY ONLY) EKG is normal.  I suspect that he is having occasional PVCs but I did recommend a Zio monitor to rule out episodes of A-fib or a flutter or SVT.  The chest pain sounds very atypical.  Will get a coronary artery calcium score to risk stratify the patient to see if there is significant plaque in his coronary arteries.  If so I would recommend cardiology consultation however the chest pain is nonexertional and very atypical.

## 2023-11-26 ENCOUNTER — Encounter: Payer: Self-pay | Admitting: Family Medicine

## 2023-12-01 ENCOUNTER — Ambulatory Visit (HOSPITAL_COMMUNITY)
Admission: RE | Admit: 2023-12-01 | Discharge: 2023-12-01 | Disposition: A | Discharge status: Home / Self Care | Payer: Self-pay | Source: Ambulatory Visit | Attending: Family Medicine | Admitting: Family Medicine

## 2023-12-01 DIAGNOSIS — R0789 Other chest pain: Secondary | ICD-10-CM | POA: Insufficient documentation

## 2023-12-02 ENCOUNTER — Other Ambulatory Visit: Payer: Self-pay

## 2023-12-02 MED ORDER — ROSUVASTATIN CALCIUM 20 MG PO TABS: 20.0000 mg | ORAL_TABLET | Freq: Every day | ORAL | 3 refills | Status: DC

## 2023-12-13 ENCOUNTER — Other Ambulatory Visit: Payer: Self-pay

## 2023-12-13 ENCOUNTER — Ambulatory Visit: Payer: Self-pay | Admitting: Family Medicine

## 2023-12-13 ENCOUNTER — Ambulatory Visit: Payer: Self-pay

## 2023-12-13 DIAGNOSIS — E785 Hyperlipidemia, unspecified: Secondary | ICD-10-CM

## 2023-12-13 MED ORDER — ATORVASTATIN CALCIUM 40 MG PO TABS
40.0000 mg | ORAL_TABLET | Freq: Every day | ORAL | 1 refills | Status: DC
Start: 2023-12-13 — End: 2024-06-19

## 2023-12-13 NOTE — Telephone Encounter (Signed)
 Copied from CRM [id number]. Topic: Clinical - Pink Word Triage >> Dec 13, 2023  9:59 AM Brian Gay S wrote: Reason for Triage: Pt calling stated rx is making sick and he has concerns, nausea that started on 04/26 and would like a cb from [phone]  12/13/23 10:49a- First attempt, no answer, LVMTCB [phone]

## 2023-12-13 NOTE — Telephone Encounter (Signed)
 Chief Complaint: Nausea Symptoms: Dizziness Frequency: About a week Pertinent Negatives: Patient denies vomiting Disposition: [] ED /[] Urgent Care (no appt availability in office) / [] Appointment(In office/virtual)/ []  [facility]/ [] Home Care/ [] Refused Recommended Disposition /[] [facility]/ [x]  Follow-up with PCP Additional Notes: Patient called in to report nausea that started after he began taking Crestor . Patient stated he started the medication about a week ago and symptoms started a few days later. Patient denied vomiting at this time. Patient stated he had to stop taking the medication due to the nausea. Patient is requesting a different medication. Patient would also like a call back to further discuss his imaging results and cholesterol levels. Please advise. Patient would like a call back today.   Reason for Disposition  Taking prescription medication that could cause nausea (e.g., narcotics/opiates, antibiotics, OCPs, many others)  Answer Assessment - Initial Assessment Questions 1. NAUSEA SEVERITY: "How bad is the nausea?" (e.g., mild, moderate, severe; dehydration, weight loss)   - MILD: loss of appetite without change in eating habits   - MODERATE: decreased oral intake without significant weight loss, dehydration, or malnutrition   - SEVERE: inadequate caloric or fluid intake, significant weight loss, symptoms of dehydration     States his is still able to eat normally 2. ONSET: "When did the nausea begin?"     States is started 3-4 days after starting Crestor , started last Saturday 3. VOMITING: "Any vomiting?" If Yes, ask: "How many times today?"     Denies 4. RECURRENT SYMPTOM: "Have you had nausea before?" If Yes, ask: "When was the last time?" "What happened that time?"     Denies 5. CAUSE: "What do you think is causing the nausea?"     Crestor   Protocols used: Nausea-A-AH

## 2023-12-13 NOTE — Telephone Encounter (Signed)
  Chief Complaint: Medication Request  Symptoms: ` Frequency: ` Pertinent Negatives: Patient denies ` Disposition: [] ED /[] Urgent Care (no appt availability in office) / [] Appointment(In office/virtual)/ []  [facility]/ [] Home Care/ [] Refused Recommended Disposition /[] [facility]/ [x]  Follow-up with PCP  Additional Notes: Patient already spoke with another triage nurse.   Reason for Disposition  Caller has already spoken with another triager and has no further questions.  Protocols used: No Contact or Duplicate Contact Call-A-AH

## 2024-01-15 DIAGNOSIS — R002 Palpitations: Secondary | ICD-10-CM

## 2024-01-17 ENCOUNTER — Ambulatory Visit: Payer: Self-pay | Admitting: Family Medicine

## 2024-05-26 ENCOUNTER — Telehealth: Payer: Self-pay

## 2024-05-26 DIAGNOSIS — G4733 Obstructive sleep apnea (adult) (pediatric): Secondary | ICD-10-CM

## 2024-05-26 NOTE — Telephone Encounter (Signed)
 Copied from CRM #[id number]. Topic: Clinical - Order For Equipment >> May 23, 2024  4:13 PM Isabell A wrote: Reason for CRM: Patient is requesting a new CPAP machine - would also like to change DME coompany, currently using [facility].  Callback number: [phone]   ATC x1. No  answer- left vm to call back.

## 2024-05-26 NOTE — Telephone Encounter (Signed)
 Pt returning you call Marcus.  Called the office.  Marcus is busy at the moment w/ another pt and cannot take my call. Pt states he is now going into a meting, and will not be available until after 1:30 PM today

## 2024-05-31 NOTE — Telephone Encounter (Signed)
 Called the pt and there was no answer- LMTCB

## 2024-06-07 NOTE — Telephone Encounter (Signed)
 Please advise if okay to place order for new machine,please include settings

## 2024-06-09 NOTE — Telephone Encounter (Signed)
 Tried calling the patient- no asnwer. LDVM (DPR) stating I have placed order for new cpap machine.

## 2024-06-09 NOTE — Telephone Encounter (Signed)
 Okay to order CPAP  Auto CPAP 5-20 is the setting

## 2024-06-12 NOTE — Telephone Encounter (Signed)
 ATC x2

## 2024-06-19 ENCOUNTER — Ambulatory Visit: Admitting: Family Medicine

## 2024-06-19 VITALS — BP 138/72 | HR 85 | Temp 98.4°F | Ht 72.0 in | Wt 215.0 lb

## 2024-06-19 DIAGNOSIS — J0110 Acute frontal sinusitis, unspecified: Secondary | ICD-10-CM | POA: Diagnosis not present

## 2024-06-19 DIAGNOSIS — I10 Essential (primary) hypertension: Secondary | ICD-10-CM | POA: Diagnosis not present

## 2024-06-19 DIAGNOSIS — E785 Hyperlipidemia, unspecified: Secondary | ICD-10-CM | POA: Diagnosis not present

## 2024-06-19 MED ORDER — LEVOFLOXACIN 500 MG PO TABS: 500.0000 mg | ORAL_TABLET | Freq: Every day | ORAL | 0 refills | Status: AC

## 2024-06-19 MED ORDER — FLUTICASONE PROPIONATE 50 MCG/ACT NA SUSP: 2.0000 | Freq: Every day | NASAL | 6 refills | Status: AC

## 2024-06-19 MED ORDER — LOSARTAN POTASSIUM-HCTZ 100-12.5 MG PO TABS
1.0000 | ORAL_TABLET | Freq: Every day | ORAL | 3 refills | Status: AC
Start: 2024-06-19 — End: ?

## 2024-06-19 MED ORDER — ATORVASTATIN CALCIUM 40 MG PO TABS: 40.0000 mg | ORAL_TABLET | Freq: Every day | ORAL | 1 refills | Status: DC

## 2024-06-19 NOTE — Progress Notes (Signed)
 Subjective:    Patient ID: Brian Gay, male    DOB: Nov 07, 1965, 58 y.o.   MRN: [id number]  Patient states he has been dealing with sinus infection for over 4 weeks.  He has been dealing with headaches and head congestion and postnasal drip for several weeks.  He has been to an urgent care on 2 separate occasions and received doxycycline  along with Augmentin  and prednisone .  Nothing seems to be helping the symptoms.  He continues to have pain and pressure in his left frontal sinus causing a severe headache.  He continues to have severe nasal congestion and rhinorrhea.  He also reports pain in his teeth and in both the ears.  He has retracted tympanic membranes bilaterally and symptoms of eustachian tube dysfunction.  He also reports a cough secondary to postnasal drip and chest congestion.  Tested positive for COVID over a week ago which did not help his symptoms. Past Medical History:  Diagnosis Date   Allergy    Hypertension    Kidney stones    Past Surgical History:  Procedure Laterality Date   CLOSED REDUCTION FINGER WITH PERCUTANEOUS PINNING Right 03/19/2016   Procedure: CLOSED REDUCTION right ring  FINGER;  Surgeon: Arley Curia, MD;  Location: [facility];  Service: Orthopedics;  Laterality: Right;   cyst removed      pilonidal cyst   WISDOM TOOTH EXTRACTION     Current Outpatient Medications on File Prior to Visit  Medication Sig Dispense Refill   benzonatate  (TESSALON ) 200 MG capsule Take 200 mg by mouth 3 (three) times daily as needed.     promethazine-dextromethorphan (PROMETHAZINE-DM) 6.25-15 MG/5ML syrup Take 5 mLs by mouth 4 (four) times daily as needed.     FLUoxetine  (PROZAC ) 20 MG capsule Take 1 capsule (20 mg total) by mouth daily. (Patient not taking: Reported on 05/20/2023) 90 capsule 3   gabapentin  (NEURONTIN ) 300 MG capsule Take 1 capsule (300 mg total) by mouth 3 (three) times daily as needed. (Patient not taking: Reported on 11/23/2023) 90 capsule 3    predniSONE  (DELTASONE ) 20 MG tablet Take 3 tablets (60 mg total) by mouth daily with breakfast. (Patient not taking: Reported on 11/23/2023) 21 tablet 0   No current facility-administered medications on file prior to visit.   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   Social Drivers of Health   Financial Resource Strain: Low Risk  (06/19/2024)   Overall  Financial Resource Strain (CARDIA)    Difficulty of Paying Living Expenses: Not hard at all  Food Insecurity: No Food Insecurity (06/19/2024)   Hunger Vital Sign    Worried About Running Out of Food in the Last Year: Never true    Ran Out of Food in the Last Year: Never true  Transportation Needs: No Transportation Needs (06/19/2024)   PRAPARE - Administrator, Civil Service (Medical): No    Lack of Transportation (Non-Medical): No  Physical Activity: Inactive (06/19/2024)   Exercise Vital Sign    Days of Exercise per Week: 0 days    Minutes of Exercise per Session: Not on file  Stress: No Stress Concern Present (06/19/2024)   [employer] of Occupational  Health - Occupational Stress Questionnaire    Feeling of Stress: Not at all  Social Connections: Unknown (06/19/2024)   Social Connection and Isolation Panel    Frequency of Communication with Friends and Family: Once a week    Frequency of Social Gatherings with Friends and Family: Once a week    Attends Religious Services: More than 4 times per year    Active Member of [employer] or Organizations: No    Attends Engineer, Structural: Not on file    Marital Status: Not on file  Intimate Partner Violence: Not on file   Family History  Problem Relation Age of Onset   Heart disease Mother    Hypertension Mother    Heart disease Father    Cancer Father        prostate   Prostate cancer Father    Hyperlipidemia Father    Stroke Father    Heart disease Maternal Grandmother    Heart disease Maternal Grandfather    Heart disease Paternal Grandmother    Heart disease Paternal Grandfather    Colon cancer Neg Hx    Colon polyps Neg Hx    Rectal cancer Neg Hx    Stomach cancer Neg Hx      Review of Systems  Cardiovascular:  Positive for palpitations.  All other systems reviewed and are negative.      Objective:   Physical Exam Vitals reviewed.  Constitutional:      General: He is not in acute distress.    Appearance: Normal appearance. He is normal weight. He is not ill-appearing or toxic-appearing.  HENT:     Head: Normocephalic and atraumatic.     Right Ear: Tympanic membrane is erythematous and retracted.     Left Ear: Tympanic membrane is erythematous and retracted.     Nose:     Right Turbinates: Enlarged and swollen.     Left Turbinates: Enlarged and swollen.     Left Sinus: Frontal sinus tenderness present.  Neck:     Vascular: No carotid bruit.  Cardiovascular:     Rate and Rhythm: Normal rate and regular rhythm.     Pulses: Normal pulses.     Heart sounds: Normal heart sounds. No murmur heard.    No friction rub. No gallop.  Pulmonary:     Effort:  Pulmonary effort is normal. No respiratory distress.     Breath sounds: Normal breath sounds. No stridor. No wheezing, rhonchi or rales.  Musculoskeletal:     Right lower leg: No edema.     Left lower leg: No edema.  Lymphadenopathy:     Cervical: No cervical adenopathy.  Skin:    General: Skin is warm.     Coloration: Skin is not  jaundiced.     Findings: No bruising, erythema or lesion.  Neurological:     General: No focal deficit present.     Mental Status: He is alert and oriented to person, place, and time. Mental status is at baseline.     Cranial Nerves: No cranial nerve deficit.     Motor: No weakness.     Coordination: Coordination normal.     Gait: Gait normal.     Deep Tendon Reflexes: Reflexes normal.           Assessment & Plan:  Subacute frontal sinusitis  Essential hypertension - Plan: losartan -hydrochlorothiazide (HYZAAR) 100-12.5 MG tablet  Hyperlipidemia, unspecified hyperlipidemia type - Plan: atorvastatin  (LIPITOR) 40 MG tablet Patient has been treated appropriately by the urgent care and yet continues to have significant symptoms.  I believe his recent COVID diagnosis likely just exacerbating his underlying subacute frontal sinusitis.  We will try Levaquin  to cover Pseudomonas in case of antibiotic resistance 500 mg daily for 7 days.  Add fluticasone  2 sprays each nostril daily.  Recommended using a Nettie pot 3-4 times a day to try to flush the sinuses.  If not improving we will need to get imaging of the sinuses to confirm persistent sinusitis and then likely consult ENT for surgical aspiration

## 2024-08-14 ENCOUNTER — Ambulatory Visit: Payer: Self-pay

## 2024-08-14 NOTE — Telephone Encounter (Signed)
 Unable to reach pt, LVM requesting return call, routing to clinic for follow up.     Reason for Triage: Patient's wife calling in and patient tested positive for Flu type A. Patient is coughing really bad. When patient coughs his chest, legs, and bottom hurts. He's experiencing chills. Would like a medication to be called in.   Call back # [phone]

## 2024-08-15 ENCOUNTER — Ambulatory Visit: Payer: Self-pay

## 2024-08-15 ENCOUNTER — Other Ambulatory Visit: Payer: Self-pay | Admitting: Family Medicine

## 2024-08-15 MED ORDER — OSELTAMIVIR PHOSPHATE 75 MG PO CAPS: 75.0000 mg | ORAL_CAPSULE | Freq: Two times a day (BID) | ORAL | 0 refills | Status: AC

## 2024-08-15 NOTE — Telephone Encounter (Signed)
 FYI Only or Action Required?: Action required by provider: Tamiflu  request to [facility] on [address].  Patient was last seen in primary care on 06/19/2024 by Duanne Butler DASEN, MD.  Called Nurse Triage reporting Cough and Influenza.  Symptoms began 2 days ago.  Interventions attempted: Rest, hydration, or home remedies.  Symptoms are: gradually worsening.  Triage Disposition: Call PCP Within 24 Hours  Patient/caregiver understands and will follow disposition?: No, wishes to speak with PCP   Reason for Disposition  [1] Patient is NOT HIGH RISK AND [2] strongly requests antiviral medicine AND [3] flu symptoms present < 48 hours  Answer Assessment - Initial Assessment Questions Additional info: Refuses appointment, requesting Tamiflu  to [facility] on [address] today.     1. WORST SYMPTOM: What is your worst symptom? (e.g., cough, runny nose, muscle aches, headache, sore throat, fever)      cough 2. ONSET: When did your flu symptoms start?      Flu A positive 08/14/24, symptoms started 08/13/24 3. COUGH: How bad is the cough?       Severe-unable to rest  4. RESPIRATORY DISTRESS: Describe your breathing.      Mild shortness of breath during cough only 5. FEVER: Do you have a fever? If Yes, ask: What is your temperature, how was it measured, and when did it start?     102.0 6. EXPOSURE: Were you exposed to someone with influenza?       unsure 7. FLU VACCINE: Did you get a flu shot this year?      8. HIGH RISK DISEASE: Do you have any chronic medical problems? (e.g., heart or lung disease, asthma, weak immune system, or other HIGH RISK conditions)      9. OTHER SYMPTOMS: Do you have any other symptoms?  (e.g., runny nose, muscle aches, headache, sore throat)       Back pain  Protocols used: Influenza (Flu) [facility] Copied from CRM [id number]. Topic: Clinical - Red Word Triage >> Aug 15, 2024  9:26 AM Amy B wrote: Red Word that prompted transfer to Nurse Triage:  Fever 102, severe cough, back pain

## 2024-08-21 ENCOUNTER — Ambulatory Visit (INDEPENDENT_AMBULATORY_CARE_PROVIDER_SITE_OTHER): Admitting: Family Medicine

## 2024-08-21 ENCOUNTER — Encounter: Payer: Self-pay | Admitting: Family Medicine

## 2024-08-21 VITALS — BP 118/64 | HR 72 | Temp 97.7°F | Ht 72.0 in | Wt 216.4 lb

## 2024-08-21 DIAGNOSIS — Z125 Encounter for screening for malignant neoplasm of prostate: Secondary | ICD-10-CM

## 2024-08-21 DIAGNOSIS — Z0001 Encounter for general adult medical examination with abnormal findings: Secondary | ICD-10-CM | POA: Diagnosis not present

## 2024-08-21 DIAGNOSIS — E7800 Pure hypercholesterolemia, unspecified: Secondary | ICD-10-CM | POA: Diagnosis not present

## 2024-08-21 DIAGNOSIS — Z0000 Encounter for general adult medical examination without abnormal findings: Secondary | ICD-10-CM

## 2024-08-21 DIAGNOSIS — I2510 Atherosclerotic heart disease of native coronary artery without angina pectoris: Secondary | ICD-10-CM

## 2024-08-21 MED ORDER — ROSUVASTATIN CALCIUM 20 MG PO TABS: 20.0000 mg | ORAL_TABLET | Freq: Every day | ORAL | 3 refills | Status: AC

## 2024-08-21 NOTE — Progress Notes (Signed)
 "  Subjective:    Patient ID: Brian Gay, male    DOB: 1966-07-25, 59 y.o.   MRN: [id number]  HPI Patient is here today for complete physical exam.  He is a 59 year old smoker.  He recently was diagnosed with influenza A and subsequent went to the urgent care where he was diagnosed with pneumonia on Saturday.  He is currently on antibiotics for pneumonia.  He is due for lung cancer screening with a CT scan of the lung due to smoking.  His last colonoscopy was in 2017 and is due again in 2027.  He is due for a PSA to screen for prostate cancer.  I did a coronary artery calcium  score in April of this year which showed 90th percentile plaque mainly in the right coronary artery.  He is currently on atorvastatin  for this but is having diffuse myalgias on the atorvastatin .  He is due for a pneumonia vaccine.  His flu shot and shingles shot are up-to-date. Past Medical History:  Diagnosis Date   Allergy    Hypertension    Kidney stones    Past Surgical History:  Procedure Laterality Date   CLOSED REDUCTION FINGER WITH PERCUTANEOUS PINNING Right 03/19/2016   Procedure: CLOSED REDUCTION right ring  FINGER;  Surgeon: Arley Curia, MD;  Location: [facility];  Service: Orthopedics;  Laterality: Right;   cyst removed      pilonidal cyst   WISDOM TOOTH EXTRACTION     Current Outpatient Medications on File Prior to Visit  Medication Sig Dispense Refill   benzonatate  (TESSALON ) 200 MG capsule Take 200 mg by mouth 3 (three) times daily as needed.     fluticasone  (FLONASE ) 50 MCG/ACT nasal spray Place 2 sprays into both nostrils daily. 16 g 6   losartan -hydrochlorothiazide (HYZAAR) 100-12.5 MG tablet Take 1 tablet by mouth daily. 90 tablet 3   promethazine-dextromethorphan (PROMETHAZINE-DM) 6.25-15 MG/5ML syrup Take 5 mLs by mouth 4 (four) times daily as needed.     FLUoxetine  (PROZAC ) 20 MG capsule Take 1 capsule (20 mg total) by mouth daily. (Patient not taking: Reported on 08/21/2024) 90 capsule  3   gabapentin  (NEURONTIN ) 300 MG capsule Take 1 capsule (300 mg total) by mouth 3 (three) times daily as needed. (Patient not taking: Reported on 08/21/2024) 90 capsule 3   oseltamivir  (TAMIFLU ) 75 MG capsule Take 1 capsule (75 mg total) by mouth 2 (two) times daily. (Patient not taking: Reported on 08/21/2024) 10 capsule 0   predniSONE  (DELTASONE ) 20 MG tablet Take 3 tablets (60 mg total) by mouth daily with breakfast. (Patient not taking: Reported on 08/21/2024) 21 tablet 0   No current facility-administered medications on file prior to visit.   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   Social Drivers of Health   Tobacco Use: High Risk (08/21/2024)   Patient History    Smoking Tobacco Use: Former    Smokeless Tobacco Use: Current    Passive Exposure: Not on file  Financial Resource Strain: Low Risk (06/19/2024)   Overall Financial Resource Strain (CARDIA)    Difficulty of Paying Living Expenses: Not hard at all  Food Insecurity: No Food Insecurity (06/19/2024)   Epic     Worried About Programme Researcher, Broadcasting/film/video in the Last Year: Never true    Ran Out of Food in the Last Year: Never true  Transportation Needs: No Transportation Needs (06/19/2024)   Epic    Lack of Transportation (Medical): No    Lack of Transportation (Non-Medical): No  Physical Activity: Inactive (06/19/2024)   Exercise Vital Sign    Days of Exercise per Week: 0 days    Minutes of Exercise per Session: Not on file  Stress: No Stress Concern Present (06/19/2024)   [employer] of Occupational Health - Occupational Stress Questionnaire    Feeling of Stress: Not at all  Social Connections: Unknown (06/19/2024)   Social Connection and Isolation Panel    Frequency of Communication with Friends and Family: Once a week    Frequency of Social Gatherings with Friends and Family: Once a week    Attends Religious Services: More than 4 times per year    Active Member of [employer] or Organizations: No    Attends Engineer, Structural: Not on file    Marital Status: Not on file  Intimate Partner Violence: Not on file  Depression (PHQ2-9): Low Risk (11/23/2023)   Depression (PHQ2-9)    PHQ-2 Score: 0  Alcohol Screen: Low Risk (11/19/2023)   Alcohol Screen    Last Alcohol Screening Score (AUDIT): 1  Housing: Low Risk (06/19/2024)   Epic    Unable to Pay for Housing in the Last Year: No    Number of Times Moved in the Last Year: 0    Homeless in the Last Year: No  Utilities: Not on file  Health Literacy: Not on file   Family History  Problem Relation Age of Onset   Heart disease Mother    Hypertension Mother    Heart disease Father    Cancer Father        prostate   Prostate cancer Father    Hyperlipidemia Father    Stroke Father    Heart disease Maternal Grandmother    Heart disease Maternal Grandfather    Heart disease Paternal Grandmother    Heart disease Paternal Grandfather    Colon cancer Neg Hx    Colon polyps Neg Hx    Rectal cancer Neg Hx    Stomach cancer Neg Hx      Review  of Systems  Genitourinary:  Positive for testicular pain. Negative for genital sores, hematuria, penile discharge, penile pain, penile swelling and scrotal swelling.  All other systems reviewed and are negative.      Objective:   Physical Exam Vitals reviewed.  Constitutional:      General: He is not in acute  distress.    Appearance: Normal appearance. He is normal weight. He is not ill-appearing or toxic-appearing.  HENT:     Head: Normocephalic and atraumatic.     Right Ear: Tympanic membrane and ear canal normal.     Left Ear: Tympanic membrane and ear canal normal.     Nose: Nose normal. No congestion or rhinorrhea.     Mouth/Throat:     Mouth: Mucous membranes are moist.     Pharynx: Oropharynx is clear. No oropharyngeal exudate or posterior oropharyngeal erythema.  Eyes:     Extraocular Movements: Extraocular movements intact.     Conjunctiva/sclera: Conjunctivae normal.     Pupils: Pupils are equal, round, and reactive to light.  Neck:     Vascular: No carotid bruit.  Cardiovascular:     Rate and Rhythm: Normal rate and regular rhythm.     Pulses: Normal pulses.     Heart sounds: Normal heart sounds. No murmur heard.    No friction rub. No gallop.  Pulmonary:     Effort: Pulmonary effort is normal. No respiratory distress.     Breath sounds: Normal breath sounds. No stridor. No wheezing, rhonchi or rales.  Abdominal:     General: Bowel sounds are normal. There is no distension.     Palpations: Abdomen is soft. There is no mass.     Tenderness: There is no abdominal tenderness. There is no guarding or rebound.     Hernia: No hernia is present.  Genitourinary:    Penis: Normal.      Testes:        Right: Mass or tenderness not present.        Left: Tenderness and varicocele present.  Musculoskeletal:        General: Normal range of motion.     Cervical back: Normal range of motion. No rigidity or tenderness.     Right lower leg: No edema.     Left lower leg: No  edema.  Lymphadenopathy:     Cervical: No cervical adenopathy.  Skin:    General: Skin is warm.     Coloration: Skin is not jaundiced.     Findings: No bruising, erythema or lesion.  Neurological:     General: No focal deficit present.     Mental Status: He is alert and oriented to person, place, and time. Mental status is at baseline.     Cranial Nerves: No cranial nerve deficit.     Motor: No weakness.     Coordination: Coordination normal.     Gait: Gait normal.     Deep Tendon Reflexes: Reflexes normal.  Psychiatric:        Mood and Affect: Mood normal.        Thought Content: Thought content normal.        Judgment: Judgment normal.           Assessment & Plan:  Prostate cancer screening - Plan: PSA  Pure hypercholesterolemia - Plan: CBC with Differential/Platelet, Comprehensive metabolic panel with GFR, Lipid panel  General medical exam  Coronary artery disease involving native coronary artery of native heart without angina pectoris I will screen for prostate cancer with a PSA.  Colon cancer screening is due next year.  Recommended that the patient get a CT scan of the lungs to screen for lung cancer.  He will call me in April when he is ready for me to schedule at.  Recommended Prevnar 20 when the patient is feeling better as  he is still acutely ill with his recent battle with pneumonia.  Check CBC CMP and lipid panel.  Given his history of coronary artery disease I would like to see his LDL cholesterol less than 70.  We will switch the patient from atorvastatin  to rosuvastatin  20 mg a day to see if he tolerates this dose better.  Flu shot and shingles vaccine are up-to-date.  He declines hepatitis B vaccine and COVID-vaccine "

## 2024-08-22 ENCOUNTER — Ambulatory Visit: Payer: Self-pay | Admitting: Family Medicine

## 2024-08-22 LAB — COMPREHENSIVE METABOLIC PANEL WITH GFR
AG Ratio: 1.6 (calc) (ref 1.0–2.5)
ALT: 45 U/L (ref 9–46)
AST: 23 U/L (ref 10–35)
Albumin: 4.1 g/dL (ref 3.6–5.1)
Alkaline phosphatase (APISO): 86 U/L (ref 35–144)
BUN: 13 mg/dL (ref 7–25)
CO2: 27 mmol/L (ref 20–32)
Calcium: 9.2 mg/dL (ref 8.6–10.3)
Chloride: 104 mmol/L (ref 98–110)
Creat: 0.91 mg/dL (ref 0.70–1.30)
Globulin: 2.5 g/dL (ref 1.9–3.7)
Glucose, Bld: 78 mg/dL (ref 65–99)
Potassium: 4.3 mmol/L (ref 3.5–5.3)
Sodium: 141 mmol/L (ref 135–146)
Total Bilirubin: 0.6 mg/dL (ref 0.2–1.2)
Total Protein: 6.6 g/dL (ref 6.1–8.1)
eGFR: 98 mL/min/1.73m2

## 2024-08-22 LAB — CBC WITH DIFFERENTIAL/PLATELET
Absolute Lymphocytes: 1699 {cells}/uL (ref 850–3900)
Absolute Monocytes: 551 {cells}/uL (ref 200–950)
Basophils Absolute: 29 {cells}/uL (ref 0–200)
Basophils Relative: 0.5 %
Eosinophils Absolute: 70 {cells}/uL (ref 15–500)
Eosinophils Relative: 1.2 %
HCT: 54.5 % — ABNORMAL HIGH (ref 39.4–51.1)
Hemoglobin: 17.8 g/dL — ABNORMAL HIGH (ref 13.2–17.1)
MCH: 30.9 pg (ref 27.0–33.0)
MCHC: 32.7 g/dL (ref 31.6–35.4)
MCV: 94.6 fL (ref 81.4–101.7)
MPV: 11.1 fL (ref 7.5–12.5)
Monocytes Relative: 9.5 %
Neutro Abs: 3451 {cells}/uL (ref 1500–7800)
Neutrophils Relative %: 59.5 %
Platelets: 204 Thousand/uL (ref 140–400)
RBC: 5.76 Million/uL (ref 4.20–5.80)
RDW: 12.6 % (ref 11.0–15.0)
Total Lymphocyte: 29.3 %
WBC: 5.8 Thousand/uL (ref 3.8–10.8)

## 2024-08-22 LAB — LIPID PANEL
Cholesterol: 128 mg/dL
HDL: 29 mg/dL — ABNORMAL LOW
LDL Cholesterol (Calc): 77 mg/dL
Non-HDL Cholesterol (Calc): 99 mg/dL
Total CHOL/HDL Ratio: 4.4 (calc)
Triglycerides: 136 mg/dL

## 2024-08-22 LAB — PSA: PSA: 1.52 ng/mL
